# Patient Record
Sex: Male | Born: 1935 | Race: White | Hispanic: No | Smoking: Former smoker
Health system: Southern US, Community
[De-identification: ages and names within clinical notes are randomized; demographics above are authoritative.]

## PROBLEM LIST (undated history)

## (undated) DIAGNOSIS — I2510 Atherosclerotic heart disease of native coronary artery without angina pectoris: Secondary | ICD-10-CM

## (undated) DIAGNOSIS — E785 Hyperlipidemia, unspecified: Secondary | ICD-10-CM

## (undated) DIAGNOSIS — I714 Abdominal aortic aneurysm, without rupture, unspecified: Secondary | ICD-10-CM

## (undated) DIAGNOSIS — Z8679 Personal history of other diseases of the circulatory system: Secondary | ICD-10-CM

## (undated) DIAGNOSIS — I10 Essential (primary) hypertension: Secondary | ICD-10-CM

## (undated) DIAGNOSIS — I071 Rheumatic tricuspid insufficiency: Secondary | ICD-10-CM

## (undated) DIAGNOSIS — I723 Aneurysm of iliac artery: Secondary | ICD-10-CM

## (undated) DIAGNOSIS — N183 Chronic kidney disease, stage 3 unspecified: Secondary | ICD-10-CM

## (undated) DIAGNOSIS — W19XXXA Unspecified fall, initial encounter: Secondary | ICD-10-CM

## (undated) DIAGNOSIS — I5032 Chronic diastolic (congestive) heart failure: Secondary | ICD-10-CM

## (undated) DIAGNOSIS — T4145XA Adverse effect of unspecified anesthetic, initial encounter: Secondary | ICD-10-CM

## (undated) DIAGNOSIS — T8859XA Other complications of anesthesia, initial encounter: Secondary | ICD-10-CM

## (undated) DIAGNOSIS — I4821 Permanent atrial fibrillation: Secondary | ICD-10-CM

## (undated) DIAGNOSIS — C801 Malignant (primary) neoplasm, unspecified: Secondary | ICD-10-CM

## (undated) HISTORY — PX: TONSILLECTOMY: SUR1361

## (undated) HISTORY — PX: CARDIAC VALVE REPLACEMENT: SHX585

## (undated) HISTORY — DX: Malignant (primary) neoplasm, unspecified: C80.1

## (undated) HISTORY — PX: EMBOLIZATION: SHX1496

## (undated) HISTORY — PX: CARDIAC CATHETERIZATION: SHX172

## (undated) HISTORY — DX: Abdominal aortic aneurysm, without rupture: I71.4

## (undated) HISTORY — DX: Unspecified fall, initial encounter: W19.XXXA

## (undated) HISTORY — PX: VASECTOMY: SHX75

## (undated) HISTORY — PX: CATARACT EXTRACTION W/ INTRAOCULAR LENS  IMPLANT, BILATERAL: SHX1307

## (undated) HISTORY — PX: AORTIC VALVE REPLACEMENT: SHX41

## (undated) HISTORY — DX: Essential (primary) hypertension: I10

## (undated) HISTORY — DX: Abdominal aortic aneurysm, without rupture, unspecified: I71.40

---

## 1997-10-06 ENCOUNTER — Other Ambulatory Visit: Admission: RE | Admit: 1997-10-06 | Discharge: 1997-10-06 | Payer: Self-pay | Admitting: Cardiology

## 2000-01-23 ENCOUNTER — Emergency Department (HOSPITAL_COMMUNITY): Admission: EM | Admit: 2000-01-23 | Discharge: 2000-01-23 | Payer: Self-pay | Admitting: Emergency Medicine

## 2000-01-26 ENCOUNTER — Encounter (HOSPITAL_COMMUNITY): Admission: RE | Admit: 2000-01-26 | Discharge: 2000-04-25 | Payer: Self-pay

## 2004-10-16 ENCOUNTER — Ambulatory Visit: Payer: Self-pay | Admitting: Internal Medicine

## 2004-11-06 ENCOUNTER — Ambulatory Visit: Payer: Self-pay | Admitting: Internal Medicine

## 2004-12-07 ENCOUNTER — Ambulatory Visit: Payer: Self-pay | Admitting: Internal Medicine

## 2005-01-08 ENCOUNTER — Ambulatory Visit: Payer: Self-pay | Admitting: Internal Medicine

## 2005-01-10 ENCOUNTER — Ambulatory Visit: Payer: Self-pay | Admitting: Internal Medicine

## 2005-02-08 ENCOUNTER — Ambulatory Visit: Payer: Self-pay | Admitting: Internal Medicine

## 2005-03-09 ENCOUNTER — Ambulatory Visit: Payer: Self-pay | Admitting: Internal Medicine

## 2005-04-09 ENCOUNTER — Ambulatory Visit: Payer: Self-pay | Admitting: Internal Medicine

## 2005-10-17 ENCOUNTER — Ambulatory Visit: Payer: Self-pay | Admitting: Internal Medicine

## 2005-11-21 ENCOUNTER — Ambulatory Visit: Payer: Self-pay | Admitting: Internal Medicine

## 2005-11-22 ENCOUNTER — Ambulatory Visit: Payer: Self-pay | Admitting: Internal Medicine

## 2005-12-21 ENCOUNTER — Ambulatory Visit: Payer: Self-pay | Admitting: Internal Medicine

## 2006-01-22 ENCOUNTER — Ambulatory Visit: Payer: Self-pay | Admitting: Internal Medicine

## 2006-01-23 ENCOUNTER — Ambulatory Visit: Payer: Self-pay | Admitting: Internal Medicine

## 2006-02-21 ENCOUNTER — Ambulatory Visit: Payer: Self-pay | Admitting: Internal Medicine

## 2006-03-13 ENCOUNTER — Ambulatory Visit: Payer: Self-pay | Admitting: Internal Medicine

## 2006-03-13 LAB — CONVERTED CEMR LAB
PSA: 5.71 ng/mL — ABNORMAL HIGH (ref 0.10–4.00)
Sed Rate: 62 mm/hr — ABNORMAL HIGH (ref 0–20)
Total CK: 66 units/L (ref 7–195)

## 2006-03-21 ENCOUNTER — Ambulatory Visit: Payer: Self-pay | Admitting: Internal Medicine

## 2006-03-25 ENCOUNTER — Ambulatory Visit: Payer: Self-pay | Admitting: Internal Medicine

## 2006-04-04 ENCOUNTER — Ambulatory Visit: Payer: Self-pay | Admitting: Internal Medicine

## 2006-10-23 ENCOUNTER — Ambulatory Visit: Payer: Self-pay | Admitting: Internal Medicine

## 2006-11-27 ENCOUNTER — Ambulatory Visit: Payer: Self-pay | Admitting: Internal Medicine

## 2006-12-02 ENCOUNTER — Encounter: Payer: Self-pay | Admitting: Internal Medicine

## 2006-12-02 DIAGNOSIS — I482 Chronic atrial fibrillation, unspecified: Secondary | ICD-10-CM

## 2006-12-02 DIAGNOSIS — E785 Hyperlipidemia, unspecified: Secondary | ICD-10-CM | POA: Insufficient documentation

## 2006-12-02 DIAGNOSIS — N400 Enlarged prostate without lower urinary tract symptoms: Secondary | ICD-10-CM

## 2006-12-26 ENCOUNTER — Ambulatory Visit: Payer: Self-pay | Admitting: Internal Medicine

## 2006-12-26 LAB — CONVERTED CEMR LAB
INR: 2.8
INR: 2.8
Prothrombin Time: 20.2 s
Prothrombin Time: 20.2 s

## 2007-01-15 ENCOUNTER — Ambulatory Visit: Payer: Self-pay | Admitting: Internal Medicine

## 2007-01-21 ENCOUNTER — Telehealth (INDEPENDENT_AMBULATORY_CARE_PROVIDER_SITE_OTHER): Payer: Self-pay | Admitting: *Deleted

## 2007-01-27 ENCOUNTER — Telehealth: Payer: Self-pay | Admitting: Internal Medicine

## 2007-01-29 ENCOUNTER — Ambulatory Visit: Payer: Self-pay | Admitting: Internal Medicine

## 2007-01-29 LAB — CONVERTED CEMR LAB
INR: 2.7
Prothrombin Time: 20.1 s

## 2007-02-10 ENCOUNTER — Telehealth: Payer: Self-pay | Admitting: Internal Medicine

## 2007-02-25 ENCOUNTER — Telehealth (INDEPENDENT_AMBULATORY_CARE_PROVIDER_SITE_OTHER): Payer: Self-pay | Admitting: *Deleted

## 2007-02-28 ENCOUNTER — Ambulatory Visit: Payer: Self-pay | Admitting: Internal Medicine

## 2007-02-28 LAB — CONVERTED CEMR LAB
INR: 2.2
Prothrombin Time: 18.2 s

## 2007-03-10 ENCOUNTER — Ambulatory Visit: Payer: Self-pay | Admitting: Internal Medicine

## 2007-03-10 DIAGNOSIS — M109 Gout, unspecified: Secondary | ICD-10-CM

## 2007-03-10 LAB — CONVERTED CEMR LAB
ALT: 19 units/L (ref 0–53)
AST: 22 units/L (ref 0–37)
Albumin: 3.7 g/dL (ref 3.5–5.2)
Alkaline Phosphatase: 60 units/L (ref 39–117)
BUN: 22 mg/dL (ref 6–23)
Basophils Absolute: 0 10*3/uL (ref 0.0–0.1)
Basophils Relative: 0.2 % (ref 0.0–1.0)
Bilirubin, Direct: 0.1 mg/dL (ref 0.0–0.3)
CO2: 33 meq/L — ABNORMAL HIGH (ref 19–32)
Calcium: 9.2 mg/dL (ref 8.4–10.5)
Chloride: 102 meq/L (ref 96–112)
Cholesterol: 201 mg/dL (ref 0–200)
Creatinine, Ser: 1.4 mg/dL (ref 0.4–1.5)
Direct LDL: 130.9 mg/dL
Eosinophils Absolute: 0.3 10*3/uL (ref 0.0–0.6)
Eosinophils Relative: 4.7 % (ref 0.0–5.0)
GFR calc Af Amer: 62 mL/min
GFR calc non Af Amer: 52 mL/min
Glucose, Bld: 77 mg/dL (ref 70–99)
HCT: 46.8 % (ref 39.0–52.0)
HDL: 55.2 mg/dL (ref >39.0)
Hemoglobin: 16 g/dL (ref 13.0–17.0)
Lymphocytes Relative: 17.7 % (ref 12.0–46.0)
MCHC: 34.3 g/dL (ref 30.0–36.0)
MCV: 92.7 fL (ref 78.0–100.0)
Monocytes Absolute: 0.7 10*3/uL (ref 0.2–0.7)
Monocytes Relative: 12.9 % — ABNORMAL HIGH (ref 3.0–11.0)
Neutro Abs: 3.4 10*3/uL (ref 1.4–7.7)
Neutrophils Relative %: 64.5 % (ref 43.0–77.0)
Platelets: 164 10*3/uL (ref 150–400)
Potassium: 3.9 meq/L (ref 3.5–5.1)
RBC: 5.04 M/uL (ref 4.22–5.81)
RDW: 13 % (ref 11.5–14.6)
Sodium: 142 meq/L (ref 135–145)
TSH: 2.86 microintl units/mL (ref 0.35–5.50)
Total Bilirubin: 0.9 mg/dL (ref 0.3–1.2)
Total CHOL/HDL Ratio: 3.6
Total Protein: 6.9 g/dL (ref 6.0–8.3)
Triglycerides: 71 mg/dL (ref 0–149)
VLDL: 14 mg/dL (ref 0–40)
WBC: 5.4 10*3/uL (ref 4.5–10.5)

## 2007-03-13 ENCOUNTER — Telehealth: Payer: Self-pay | Admitting: Internal Medicine

## 2007-03-17 ENCOUNTER — Telehealth: Payer: Self-pay | Admitting: Internal Medicine

## 2007-03-17 ENCOUNTER — Encounter: Admission: RE | Admit: 2007-03-17 | Discharge: 2007-03-17 | Payer: Self-pay | Admitting: Otolaryngology

## 2007-03-27 ENCOUNTER — Encounter: Admission: RE | Admit: 2007-03-27 | Discharge: 2007-06-04 | Payer: Self-pay | Admitting: Otolaryngology

## 2007-03-28 ENCOUNTER — Telehealth: Payer: Self-pay | Admitting: Internal Medicine

## 2007-04-08 ENCOUNTER — Ambulatory Visit: Payer: Self-pay | Admitting: Internal Medicine

## 2007-04-08 LAB — CONVERTED CEMR LAB
INR: 2.6
Prothrombin Time: 19.5 s

## 2007-04-09 ENCOUNTER — Encounter: Payer: Self-pay | Admitting: Internal Medicine

## 2007-04-11 ENCOUNTER — Ambulatory Visit: Payer: Self-pay | Admitting: Internal Medicine

## 2007-05-12 ENCOUNTER — Telehealth (INDEPENDENT_AMBULATORY_CARE_PROVIDER_SITE_OTHER): Payer: Self-pay | Admitting: *Deleted

## 2007-05-12 ENCOUNTER — Telehealth: Payer: Self-pay | Admitting: Internal Medicine

## 2007-05-13 ENCOUNTER — Encounter: Payer: Self-pay | Admitting: Internal Medicine

## 2007-05-21 ENCOUNTER — Telehealth (INDEPENDENT_AMBULATORY_CARE_PROVIDER_SITE_OTHER): Payer: Self-pay | Admitting: *Deleted

## 2007-06-16 ENCOUNTER — Encounter: Payer: Self-pay | Admitting: Internal Medicine

## 2007-07-21 ENCOUNTER — Encounter: Payer: Self-pay | Admitting: Internal Medicine

## 2007-08-27 ENCOUNTER — Encounter: Payer: Self-pay | Admitting: Internal Medicine

## 2007-10-15 ENCOUNTER — Ambulatory Visit: Payer: Self-pay | Admitting: Internal Medicine

## 2007-10-15 LAB — CONVERTED CEMR LAB
INR: 1.9
Prothrombin Time: 16.8 s

## 2007-10-20 ENCOUNTER — Ambulatory Visit: Payer: Self-pay | Admitting: Internal Medicine

## 2007-11-12 ENCOUNTER — Encounter: Payer: Self-pay | Admitting: Internal Medicine

## 2007-11-12 ENCOUNTER — Ambulatory Visit: Payer: Self-pay | Admitting: Internal Medicine

## 2007-11-12 LAB — CONVERTED CEMR LAB
INR: 1.8
Prothrombin Time: 16.4 s

## 2007-12-09 ENCOUNTER — Ambulatory Visit: Payer: Self-pay | Admitting: Internal Medicine

## 2007-12-09 LAB — CONVERTED CEMR LAB
INR: 2.2
Prothrombin Time: 18 s

## 2008-01-06 ENCOUNTER — Ambulatory Visit: Payer: Self-pay | Admitting: Internal Medicine

## 2008-01-06 LAB — CONVERTED CEMR LAB
INR: 2
Prothrombin Time: 17.4 s

## 2008-01-13 ENCOUNTER — Telehealth: Payer: Self-pay | Admitting: Internal Medicine

## 2008-02-04 ENCOUNTER — Ambulatory Visit: Payer: Self-pay | Admitting: Internal Medicine

## 2008-02-04 LAB — CONVERTED CEMR LAB
INR: 1.6
Prothrombin Time: 15.5 s

## 2008-03-05 ENCOUNTER — Ambulatory Visit: Payer: Self-pay | Admitting: Internal Medicine

## 2008-03-05 LAB — CONVERTED CEMR LAB
INR: 2.7
Prothrombin Time: 19.9 s

## 2008-03-17 ENCOUNTER — Ambulatory Visit: Payer: Self-pay | Admitting: Internal Medicine

## 2008-03-18 LAB — CONVERTED CEMR LAB
ALT: 16 units/L (ref 0–53)
AST: 24 units/L (ref 0–37)
Albumin: 3.6 g/dL (ref 3.5–5.2)
Alkaline Phosphatase: 62 units/L (ref 39–117)
BUN: 22 mg/dL (ref 6–23)
Basophils Absolute: 0 10*3/uL (ref 0.0–0.1)
Basophils Relative: 0 % (ref 0.0–3.0)
Bilirubin, Direct: 0.2 mg/dL (ref 0.0–0.3)
CO2: 34 meq/L — ABNORMAL HIGH (ref 19–32)
Calcium: 9.1 mg/dL (ref 8.4–10.5)
Chloride: 99 meq/L (ref 96–112)
Cholesterol: 185 mg/dL (ref 0–200)
Creatinine, Ser: 1.4 mg/dL (ref 0.4–1.5)
Eosinophils Absolute: 0.3 10*3/uL (ref 0.0–0.7)
Eosinophils Relative: 5.6 % — ABNORMAL HIGH (ref 0.0–5.0)
GFR calc Af Amer: 62 mL/min
GFR calc non Af Amer: 51 mL/min
Glucose, Bld: 80 mg/dL (ref 70–99)
HCT: 47.5 % (ref 39.0–52.0)
HDL: 46.8 mg/dL (ref >39.0)
Hemoglobin: 16.1 g/dL (ref 13.0–17.0)
LDL Cholesterol: 124 mg/dL — ABNORMAL HIGH (ref 0–99)
Lymphocytes Relative: 19.2 % (ref 12.0–46.0)
MCHC: 33.9 g/dL (ref 30.0–36.0)
MCV: 93.9 fL (ref 78.0–100.0)
Monocytes Absolute: 0.6 10*3/uL (ref 0.1–1.0)
Monocytes Relative: 12.5 % — ABNORMAL HIGH (ref 3.0–12.0)
Neutro Abs: 2.9 10*3/uL (ref 1.4–7.7)
Neutrophils Relative %: 62.7 % (ref 43.0–77.0)
PSA: 15.67 ng/mL — ABNORMAL HIGH (ref 0.10–4.00)
Platelets: 148 10*3/uL — ABNORMAL LOW (ref 150–400)
Potassium: 4.2 meq/L (ref 3.5–5.1)
RBC: 5.06 M/uL (ref 4.22–5.81)
RDW: 13.4 % (ref 11.5–14.6)
Sodium: 139 meq/L (ref 135–145)
TSH: 3.2 microintl units/mL (ref 0.35–5.50)
Total Bilirubin: 0.9 mg/dL (ref 0.3–1.2)
Total CHOL/HDL Ratio: 4
Total Protein: 7.4 g/dL (ref 6.0–8.3)
Triglycerides: 69 mg/dL (ref 0–149)
VLDL: 14 mg/dL (ref 0–40)
WBC: 4.7 10*3/uL (ref 4.5–10.5)

## 2008-03-26 ENCOUNTER — Encounter: Payer: Self-pay | Admitting: Internal Medicine

## 2008-04-01 ENCOUNTER — Telehealth: Payer: Self-pay | Admitting: Internal Medicine

## 2008-04-05 ENCOUNTER — Ambulatory Visit: Payer: Self-pay | Admitting: Internal Medicine

## 2008-04-05 LAB — CONVERTED CEMR LAB
INR: 2.2
Prothrombin Time: 18 s

## 2008-04-26 ENCOUNTER — Ambulatory Visit (HOSPITAL_BASED_OUTPATIENT_CLINIC_OR_DEPARTMENT_OTHER): Admission: RE | Admit: 2008-04-26 | Discharge: 2008-04-26 | Payer: Self-pay | Admitting: Urology

## 2008-04-26 ENCOUNTER — Encounter: Payer: Self-pay | Admitting: Internal Medicine

## 2008-04-26 ENCOUNTER — Encounter (INDEPENDENT_AMBULATORY_CARE_PROVIDER_SITE_OTHER): Payer: Self-pay | Admitting: Urology

## 2008-05-07 ENCOUNTER — Encounter: Payer: Self-pay | Admitting: Internal Medicine

## 2008-06-07 ENCOUNTER — Ambulatory Visit: Payer: Self-pay | Admitting: Internal Medicine

## 2008-06-07 LAB — CONVERTED CEMR LAB
INR: 3.3
Prothrombin Time: 21.8 s

## 2008-06-21 ENCOUNTER — Ambulatory Visit: Payer: Self-pay | Admitting: Internal Medicine

## 2008-06-24 ENCOUNTER — Ambulatory Visit: Payer: Self-pay | Admitting: Internal Medicine

## 2008-06-24 DIAGNOSIS — J069 Acute upper respiratory infection, unspecified: Secondary | ICD-10-CM | POA: Insufficient documentation

## 2008-07-02 ENCOUNTER — Ambulatory Visit: Payer: Self-pay | Admitting: Internal Medicine

## 2008-07-12 ENCOUNTER — Telehealth: Payer: Self-pay | Admitting: Internal Medicine

## 2008-07-12 ENCOUNTER — Ambulatory Visit: Payer: Self-pay | Admitting: Internal Medicine

## 2008-07-22 ENCOUNTER — Ambulatory Visit: Payer: Self-pay | Admitting: Internal Medicine

## 2008-07-22 LAB — CONVERTED CEMR LAB
INR: 3.4
Prothrombin Time: 22.4 s

## 2008-07-26 ENCOUNTER — Telehealth: Payer: Self-pay | Admitting: Internal Medicine

## 2008-09-06 ENCOUNTER — Ambulatory Visit: Payer: Self-pay | Admitting: Internal Medicine

## 2008-09-06 LAB — CONVERTED CEMR LAB
INR: 2.7
Prothrombin Time: 19.9 s

## 2008-10-04 ENCOUNTER — Encounter: Payer: Self-pay | Admitting: Internal Medicine

## 2008-10-04 ENCOUNTER — Telehealth: Payer: Self-pay | Admitting: Internal Medicine

## 2008-10-26 ENCOUNTER — Ambulatory Visit: Payer: Self-pay | Admitting: Internal Medicine

## 2008-10-26 DIAGNOSIS — N529 Male erectile dysfunction, unspecified: Secondary | ICD-10-CM

## 2008-10-28 ENCOUNTER — Ambulatory Visit: Payer: Self-pay | Admitting: Internal Medicine

## 2008-10-29 ENCOUNTER — Encounter: Payer: Self-pay | Admitting: Internal Medicine

## 2008-11-05 ENCOUNTER — Ambulatory Visit: Payer: Self-pay | Admitting: Internal Medicine

## 2008-11-05 LAB — CONVERTED CEMR LAB
INR: 2.3
Prothrombin Time: 10.7 s

## 2008-11-16 ENCOUNTER — Encounter: Admission: RE | Admit: 2008-11-16 | Discharge: 2008-11-16 | Payer: Self-pay | Admitting: Orthopedic Surgery

## 2008-11-16 ENCOUNTER — Telehealth: Payer: Self-pay | Admitting: Internal Medicine

## 2008-11-17 ENCOUNTER — Telehealth: Payer: Self-pay | Admitting: Internal Medicine

## 2008-11-18 ENCOUNTER — Encounter: Payer: Self-pay | Admitting: Internal Medicine

## 2008-11-25 ENCOUNTER — Encounter: Payer: Self-pay | Admitting: Internal Medicine

## 2008-11-30 ENCOUNTER — Telehealth: Payer: Self-pay | Admitting: Internal Medicine

## 2008-12-01 ENCOUNTER — Telehealth (INDEPENDENT_AMBULATORY_CARE_PROVIDER_SITE_OTHER): Payer: Self-pay

## 2008-12-03 ENCOUNTER — Encounter: Payer: Self-pay | Admitting: Internal Medicine

## 2009-01-03 ENCOUNTER — Encounter: Payer: Self-pay | Admitting: Internal Medicine

## 2009-01-04 ENCOUNTER — Encounter: Payer: Self-pay | Admitting: Internal Medicine

## 2009-01-17 ENCOUNTER — Telehealth (INDEPENDENT_AMBULATORY_CARE_PROVIDER_SITE_OTHER): Payer: Self-pay | Admitting: *Deleted

## 2009-01-27 ENCOUNTER — Telehealth: Payer: Self-pay | Admitting: Family Medicine

## 2009-02-08 ENCOUNTER — Ambulatory Visit: Payer: Self-pay | Admitting: Internal Medicine

## 2009-02-21 ENCOUNTER — Ambulatory Visit: Payer: Self-pay | Admitting: Internal Medicine

## 2009-03-01 ENCOUNTER — Encounter: Payer: Self-pay | Admitting: Internal Medicine

## 2009-03-10 ENCOUNTER — Ambulatory Visit: Payer: Self-pay | Admitting: Internal Medicine

## 2009-04-01 ENCOUNTER — Ambulatory Visit: Payer: Self-pay | Admitting: Internal Medicine

## 2009-04-01 DIAGNOSIS — M353 Polymyalgia rheumatica: Secondary | ICD-10-CM | POA: Insufficient documentation

## 2009-04-04 LAB — CONVERTED CEMR LAB
AST: 25 units/L (ref 0–37)
Albumin: 4.1 g/dL (ref 3.5–5.2)
Alkaline Phosphatase: 78 units/L (ref 39–117)
Basophils Absolute: 0 10*3/uL (ref 0.0–0.1)
Basophils Relative: 0 % (ref 0.0–3.0)
Bilirubin, Direct: 0.1 mg/dL (ref 0.0–0.3)
Calcium: 9.1 mg/dL (ref 8.4–10.5)
Creatinine, Ser: 1.4 mg/dL (ref 0.4–1.5)
Eosinophils Absolute: 0.3 10*3/uL (ref 0.0–0.7)
GFR calc non Af Amer: 51.27 mL/min (ref >60)
HDL: 53.7 mg/dL (ref >39.00)
Hemoglobin: 14.9 g/dL (ref 13.0–17.0)
LDL Cholesterol: 127 mg/dL — ABNORMAL HIGH (ref 0–99)
Lymphocytes Relative: 13.6 % (ref 12.0–46.0)
MCHC: 34.5 g/dL (ref 30.0–36.0)
Monocytes Relative: 12.1 % — ABNORMAL HIGH (ref 3.0–12.0)
Neutro Abs: 4.6 10*3/uL (ref 1.4–7.7)
Neutrophils Relative %: 69.2 % (ref 43.0–77.0)
RBC: 4.78 M/uL (ref 4.22–5.81)
Sodium: 141 meq/L (ref 135–145)
Total CHOL/HDL Ratio: 4
Total Protein: 8.3 g/dL (ref 6.0–8.3)
Triglycerides: 55 mg/dL (ref 0.0–149.0)
VLDL: 11 mg/dL (ref 0.0–40.0)

## 2009-04-11 ENCOUNTER — Ambulatory Visit: Payer: Self-pay | Admitting: Internal Medicine

## 2009-04-11 LAB — CONVERTED CEMR LAB
INR: 2.7
Prothrombin Time: 19.9 s

## 2009-05-20 ENCOUNTER — Encounter (INDEPENDENT_AMBULATORY_CARE_PROVIDER_SITE_OTHER): Payer: Self-pay | Admitting: *Deleted

## 2009-05-20 ENCOUNTER — Telehealth: Payer: Self-pay | Admitting: Internal Medicine

## 2009-05-21 ENCOUNTER — Encounter: Payer: Self-pay | Admitting: Internal Medicine

## 2009-06-04 DIAGNOSIS — W19XXXA Unspecified fall, initial encounter: Secondary | ICD-10-CM

## 2009-06-04 HISTORY — DX: Unspecified fall, initial encounter: W19.XXXA

## 2009-06-22 ENCOUNTER — Encounter: Payer: Self-pay | Admitting: Internal Medicine

## 2009-07-26 ENCOUNTER — Telehealth: Payer: Self-pay | Admitting: Internal Medicine

## 2009-07-27 ENCOUNTER — Encounter: Payer: Self-pay | Admitting: Internal Medicine

## 2009-07-28 ENCOUNTER — Encounter: Payer: Self-pay | Admitting: Internal Medicine

## 2009-09-01 ENCOUNTER — Encounter: Payer: Self-pay | Admitting: Internal Medicine

## 2009-09-28 ENCOUNTER — Encounter: Payer: Self-pay | Admitting: Internal Medicine

## 2009-10-03 ENCOUNTER — Ambulatory Visit: Payer: Self-pay | Admitting: Internal Medicine

## 2009-10-03 DIAGNOSIS — M549 Dorsalgia, unspecified: Secondary | ICD-10-CM | POA: Insufficient documentation

## 2009-10-03 LAB — CONVERTED CEMR LAB: INR: 2.7

## 2009-10-11 ENCOUNTER — Encounter: Payer: Self-pay | Admitting: Internal Medicine

## 2009-10-12 ENCOUNTER — Telehealth: Payer: Self-pay | Admitting: Internal Medicine

## 2009-10-13 ENCOUNTER — Encounter: Payer: Self-pay | Admitting: Internal Medicine

## 2009-10-18 ENCOUNTER — Telehealth: Payer: Self-pay | Admitting: Internal Medicine

## 2009-10-25 ENCOUNTER — Telehealth: Payer: Self-pay | Admitting: Internal Medicine

## 2009-11-01 ENCOUNTER — Ambulatory Visit: Payer: Self-pay | Admitting: Internal Medicine

## 2009-11-01 DIAGNOSIS — I359 Nonrheumatic aortic valve disorder, unspecified: Secondary | ICD-10-CM | POA: Insufficient documentation

## 2009-11-01 LAB — CONVERTED CEMR LAB
ALT: 21 units/L (ref 0–53)
AST: 22 units/L (ref 0–37)
Albumin: 3.4 g/dL — ABNORMAL LOW (ref 3.5–5.2)
Alkaline Phosphatase: 63 units/L (ref 39–117)
Basophils Absolute: 0 10*3/uL (ref 0.0–0.1)
Basophils Relative: 0.4 % (ref 0.0–3.0)
Calcium: 8.7 mg/dL (ref 8.4–10.5)
Eosinophils Relative: 5.2 % — ABNORMAL HIGH (ref 0.0–5.0)
GFR calc non Af Amer: 58.35 mL/min (ref >60)
HCT: 38.1 % — ABNORMAL LOW (ref 39.0–52.0)
Hemoglobin: 13 g/dL (ref 13.0–17.0)
Lymphocytes Relative: 11.8 % — ABNORMAL LOW (ref 12.0–46.0)
Lymphs Abs: 0.7 10*3/uL (ref 0.7–4.0)
Monocytes Relative: 16.1 % — ABNORMAL HIGH (ref 3.0–12.0)
Neutro Abs: 3.7 10*3/uL (ref 1.4–7.7)
Potassium: 4.8 meq/L (ref 3.5–5.1)
RBC: 4.09 M/uL — ABNORMAL LOW (ref 4.22–5.81)
Sodium: 135 meq/L (ref 135–145)
Total Protein: 6.5 g/dL (ref 6.0–8.3)
WBC: 5.5 10*3/uL (ref 4.5–10.5)

## 2009-11-04 ENCOUNTER — Ambulatory Visit: Payer: Self-pay | Admitting: Internal Medicine

## 2009-11-04 LAB — CONVERTED CEMR LAB: INR: 2.4

## 2009-11-15 ENCOUNTER — Ambulatory Visit: Payer: Self-pay | Admitting: Internal Medicine

## 2009-12-07 ENCOUNTER — Ambulatory Visit: Payer: Self-pay | Admitting: Internal Medicine

## 2010-01-02 ENCOUNTER — Telehealth: Payer: Self-pay | Admitting: Internal Medicine

## 2010-01-11 ENCOUNTER — Ambulatory Visit: Payer: Self-pay | Admitting: Internal Medicine

## 2010-01-12 ENCOUNTER — Telehealth: Payer: Self-pay

## 2010-01-24 ENCOUNTER — Telehealth: Payer: Self-pay | Admitting: Internal Medicine

## 2010-02-13 ENCOUNTER — Ambulatory Visit: Payer: Self-pay | Admitting: Internal Medicine

## 2010-02-15 ENCOUNTER — Ambulatory Visit: Payer: Self-pay | Admitting: Internal Medicine

## 2010-03-06 ENCOUNTER — Ambulatory Visit: Payer: Self-pay | Admitting: Cardiology

## 2010-03-13 ENCOUNTER — Ambulatory Visit: Payer: Self-pay | Admitting: Internal Medicine

## 2010-03-13 LAB — CONVERTED CEMR LAB: INR: 3

## 2010-03-14 ENCOUNTER — Encounter: Payer: Self-pay | Admitting: Cardiology

## 2010-03-14 ENCOUNTER — Encounter: Payer: Self-pay | Admitting: Internal Medicine

## 2010-03-14 ENCOUNTER — Ambulatory Visit (HOSPITAL_COMMUNITY): Admission: RE | Admit: 2010-03-14 | Discharge: 2010-03-14 | Payer: Self-pay

## 2010-03-14 ENCOUNTER — Ambulatory Visit: Payer: Self-pay | Admitting: Internal Medicine

## 2010-03-14 ENCOUNTER — Ambulatory Visit: Payer: Self-pay

## 2010-03-20 ENCOUNTER — Ambulatory Visit: Payer: Self-pay | Admitting: Cardiology

## 2010-03-20 ENCOUNTER — Encounter: Admission: RE | Admit: 2010-03-20 | Discharge: 2010-03-20 | Payer: Self-pay | Admitting: Cardiology

## 2010-03-28 ENCOUNTER — Ambulatory Visit (HOSPITAL_COMMUNITY): Admission: RE | Admit: 2010-03-28 | Discharge: 2010-03-28 | Payer: Self-pay | Admitting: Cardiology

## 2010-03-28 ENCOUNTER — Ambulatory Visit: Payer: Self-pay | Admitting: Cardiology

## 2010-03-31 ENCOUNTER — Ambulatory Visit: Payer: Self-pay | Admitting: Cardiology

## 2010-04-04 ENCOUNTER — Ambulatory Visit: Payer: Self-pay | Admitting: Surgery

## 2010-04-04 ENCOUNTER — Encounter: Payer: Self-pay | Admitting: Internal Medicine

## 2010-04-10 ENCOUNTER — Encounter: Payer: Self-pay | Admitting: Surgery

## 2010-04-10 ENCOUNTER — Telehealth: Payer: Self-pay | Admitting: Internal Medicine

## 2010-04-12 ENCOUNTER — Encounter: Payer: Self-pay | Admitting: Surgery

## 2010-04-12 ENCOUNTER — Inpatient Hospital Stay (HOSPITAL_COMMUNITY): Admission: RE | Admit: 2010-04-12 | Discharge: 2010-05-01 | Payer: Self-pay | Admitting: Surgery

## 2010-04-12 ENCOUNTER — Ambulatory Visit: Payer: Self-pay | Admitting: Cardiology

## 2010-04-12 ENCOUNTER — Ambulatory Visit: Payer: Self-pay | Admitting: Surgery

## 2010-04-19 ENCOUNTER — Telehealth: Payer: Self-pay | Admitting: Internal Medicine

## 2010-04-24 ENCOUNTER — Encounter: Payer: Self-pay | Admitting: Surgery

## 2010-05-01 ENCOUNTER — Ambulatory Visit: Payer: Self-pay | Admitting: Internal Medicine

## 2010-05-15 ENCOUNTER — Ambulatory Visit: Payer: Self-pay | Admitting: Cardiology

## 2010-05-30 ENCOUNTER — Ambulatory Visit: Payer: Self-pay | Admitting: Surgery

## 2010-05-30 ENCOUNTER — Encounter
Admission: RE | Admit: 2010-05-30 | Discharge: 2010-05-30 | Payer: Self-pay | Source: Home / Self Care | Attending: Surgery | Admitting: Surgery

## 2010-06-01 ENCOUNTER — Ambulatory Visit: Payer: Self-pay | Admitting: Cardiology

## 2010-06-15 ENCOUNTER — Ambulatory Visit: Payer: Self-pay | Admitting: Cardiology

## 2010-06-15 ENCOUNTER — Encounter (HOSPITAL_COMMUNITY)
Admission: RE | Admit: 2010-06-15 | Discharge: 2010-07-04 | Payer: Self-pay | Source: Home / Self Care | Attending: Cardiology | Admitting: Cardiology

## 2010-06-20 ENCOUNTER — Ambulatory Visit: Admission: RE | Admit: 2010-06-20 | Discharge: 2010-06-20 | Payer: Self-pay | Source: Home / Self Care

## 2010-06-20 ENCOUNTER — Encounter: Payer: Self-pay | Admitting: Cardiology

## 2010-06-20 ENCOUNTER — Ambulatory Visit (HOSPITAL_COMMUNITY)
Admission: RE | Admit: 2010-06-20 | Discharge: 2010-06-20 | Payer: Self-pay | Source: Home / Self Care | Attending: Cardiology | Admitting: Cardiology

## 2010-06-22 ENCOUNTER — Encounter: Payer: Self-pay | Admitting: Internal Medicine

## 2010-07-04 NOTE — Assessment & Plan Note (Signed)
Summary: pt/njr/pt rescd//ccm  Nurse Visit   Allergies: 1)  ! * Diltiazem Laboratory Results   Blood Tests   Date/Time Received: December 07, 2009 11:37 AM  Date/Time Reported: December 07, 2009 11:37 AM    INR: 2.4   (Normal Range: 0.88-1.12   Therap INR: 2.0-3.5) Comments: Wynona Canes, CMA  December 07, 2009 11:37 AM     Orders Added: 1)  Est. Patient Level I [99211] 2)  Protime [78469GE]  Laboratory Results   Blood Tests      INR: 2.4   (Normal Range: 0.88-1.12   Therap INR: 2.0-3.5) Comments: Wynona Canes, CMA  December 07, 2009 11:37 AM       ANTICOAGULATION RECORD PREVIOUS REGIMEN & LAB RESULTS Anticoagulation Diagnosis:  v58.83,v58.61,427.31 on  12/26/2006 Previous INR Goal Range:  2.0-3.0 on  12/26/2006 Previous INR:  2.4 on  11/04/2009 Previous Coumadin Dose(mg):  5mg  QD on  12/09/2007 Previous Regimen:  same on  11/04/2009 Previous Coagulation Comments:  Dr. Clent Ridges approved on  02/04/2008  NEW REGIMEN & LAB RESULTS Current INR: 2.4 Regimen: same  (no change)       Repeat testing in: 4 weeks MEDICATIONS WELCHOL 625 MG TABS (COLESEVELAM HCL) 2 two times a day LANOXIN 0.125 MG TABS (DIGOXIN) 1/2 once daily CLARITIN 10 MG CAPS (LORATADINE) 1 once daily as needed COUMADIN 5 MG TABS (WARFARIN SODIUM) UAD TRAMADOL HCL 50 MG TABS (TRAMADOL HCL) one every 6 hours as needed for pain   Anticoagulation Visit Questionnaire      Coumadin dose missed/changed:  No      Abnormal Bleeding Symptoms:  No   Any diet changes including alcohol intake, vegetables or greens since the last visit:  No Any illnesses or hospitalizations since the last visit:  No Any signs of clotting since the last visit (including chest discomfort, dizziness, shortness of breath, arm tingling, slurred speech, swelling or redness in leg):  No

## 2010-07-04 NOTE — Progress Notes (Signed)
Summary: cough and losartin  Phone Note Call from Patient Call back at Home Phone [phone]   Caller: Patient Call For: Evan Savers  MD Summary of Call: pt on losartan he has a cough ?side effect. [facility] [employer] [phone] Initial call taken by: Heron Sabins,  Oct 25, 2009 10:25 AM  Follow-up for Phone Call        called pt - still with cough - not using cough med r/t constipation , wondering if cough r/t losartin - since it seemed to develope at the time of the switch. Pharm indicated that this could be a side effect.  Has enough losartin until thursday but would like to know if we could call out something else? Dr. Amador Cunas to advise.  Follow-up by: Duard Brady LPN,  Oct 25, 2009 11:55 AM  Additional Follow-up for Phone Call Additional follow up Details #1::        diltiazem ER 240  one dauly  #90 Additional Follow-up by: Evan Savers  MD,  Oct 25, 2009 12:31 PM    Additional Follow-up for Phone Call Additional follow up Details #2::    med list updated , new med efilled , pt aware. KIK Follow-up by: Duard Brady LPN,  Oct 25, 2009 1:14 PM  New/Updated Medications: DILTIAZEM HCL ER BEADS 240 MG XR24H-CAP (DILTIAZEM HCL ER BEADS) qd Prescriptions: DILTIAZEM HCL ER BEADS 240 MG XR24H-CAP (DILTIAZEM HCL ER BEADS) qd  #90 x 2   Entered by:   Duard Brady LPN   Authorized by:   Evan Savers  MD   Signed by:   Duard Brady LPN on 46/96/2952   Method used:   Electronically to        [facility] [phone]* (retail)       [address]       [place], [place]  [address]       Ph: [phone]       Fax: [phone]   RxID:   [id number]

## 2010-07-04 NOTE — Progress Notes (Signed)
Summary: medication question  Phone Note Call from Patient   Caller: Patient Call For: Evan Savers  MD Summary of Call: Pt needs a subtitute for [place] called to [facility] [employer] [facility] [place])   [phone] Initial call taken by: Lynann Beaver CMA,  July 26, 2009 4:41 PM  Follow-up for Phone Call        generic Hyzaar 100/25  #90 one daily Follow-up by: Evan Savers  MD,  July 26, 2009 5:13 PM    New/Updated Medications: HYZAAR 100-25 MG TABS (LOSARTAN POTASSIUM-HCTZ) one by mouth daily Prescriptions: HYZAAR 100-25 MG TABS (LOSARTAN POTASSIUM-HCTZ) one by mouth daily  #100 x 1   Entered by:   Lynann Beaver CMA   Authorized by:   Evan Savers  MD   Signed by:   Lynann Beaver CMA on 07/26/2009   Method used:   Electronically to        [facility]. [phone]* (retail)       [address]       [place], [place]  [address]       Ph: [phone]       Fax: [phone]   RxID:   [id number]

## 2010-07-04 NOTE — Assessment & Plan Note (Signed)
Summary: PT/    pt rsc/njr  Nurse Visit   Allergies: 1)  ! * Diltiazem Laboratory Results   Blood Tests      INR: 2.1   (Normal Range: 0.88-1.12   Therap INR: 2.0-3.5) Comments: Rita Ohara  January 11, 2010 4:24 PM     Orders Added: 1)  Est. Patient Level I [99211] 2)  Protime [[id number]]   ANTICOAGULATION RECORD PREVIOUS REGIMEN & LAB RESULTS Anticoagulation Diagnosis:  v58.83,v58.61,427.31 on  12/26/2006 Previous INR Goal Range:  2.0-3.0 on  12/26/2006 Previous INR:  2.4 on  12/07/2009 Previous Coumadin Dose(mg):  5mg  QD on  12/09/2007 Previous Regimen:  same on  11/04/2009 Previous Coagulation Comments:  Dr. Clent Ridges approved on  02/04/2008  NEW REGIMEN & LAB RESULTS Current INR: 2.1 Regimen: same  Repeat testing in: 4 weeks  Anticoagulation Visit Questionnaire Coumadin dose missed/changed:  Yes Coumadin Dose Comments:  one or more missed dose(s) Abnormal Bleeding Symptoms:  No  Any diet changes including alcohol intake, vegetables or greens since the last visit:  No Any illnesses or hospitalizations since the last visit:  No Any signs of clotting since the last visit (including chest discomfort, dizziness, shortness of breath, arm tingling, slurred speech, swelling or redness in leg):  No  MEDICATIONS WELCHOL 625 MG TABS (COLESEVELAM HCL) 2 two times a day LANOXIN 0.125 MG TABS (DIGOXIN) 1/2 once daily CLARITIN 10 MG CAPS (LORATADINE) 1 once daily as needed COUMADIN 5 MG TABS (WARFARIN SODIUM) UAD TRAMADOL HCL 50 MG TABS (TRAMADOL HCL) one every 6 hours as needed for pain

## 2010-07-04 NOTE — Progress Notes (Signed)
Summary: refill coumadin  Phone Note Refill Request Message from:  Fax from Pharmacy on January 02, 2010 9:55 AM  Refills Requested: Medication #1:  COUMADIN 5 MG TABS UAD   Last Ref[facility]: [facility]/01/2009 [facility]       Method Requested: Fax to Local Pharmacy Initial call taken by: Duard Brady LPN,  January 02, 2010 9:55 AM    Prescriptions: COUMADIN 5 MG TABS (WARFARIN SODIUM) UAD  #30 x 6   Entered by:   Duard Brady LPN   Authorized by:   Gordy Savers  MD   Signed by:   Duard Brady LPN on 04/54/0981   Method used:   Faxed to ...       [facility] [address] [phone]* (retail)       [address]       [place], [place]  [address]       Ph: [phone] or [phone]       Fax: [phone]   RxID:   [id number]

## 2010-07-04 NOTE — Assessment & Plan Note (Signed)
Summary: emp---will fast//ccm/then to PT/pt rsc/cjr   Vital Signs:  Patient profile:   77 year old male Height:      69.5 inches Weight:      169 pounds BMI:     24.69 Temp:     97.8 degrees F oral Pulse rate:   66 / minute Pulse rhythm:   irregular Resp:     14 per minute BP sitting:   160 / 90  (left arm) Cuff size:   regular  Vitals Entered By: Duard Brady LPN (February 13, 2010 9:28 AM) CC: cpx - ppwk to be filled out for retirerment community Is Patient Diabetic? No   CC:  cpx - ppwk to be filled out for retirerment community.  History of Present Illness: 77 year old patient who is seen today for a wellness exam.  he will be relocating to [place]  and requires an immunization update physical hand formed completions. He has a history of severe aortic stenosis and has noted some dyspnea on exertion.  He is scheduled for cardiology follow-up next month.  He has dyslipidemia atrial fibrillation, hypertension.  He has been seen by urology for BPH.  He remains on chronic Coumadin.  He has had some intolerance to statins in the past.  Here for Medicare AWV:  1.   Risk factors based on Past M, S, F history:  history of hypertension, dyslipidemia.  He is a personal history of severe aortic stenosis and atrial fibrillation 2.   Physical Activities: remains quite active socially and physically, but has developed some dyspnea on exertion 3.   Depression/mood: no history of depression or mood disorder 4.   Hearing: no deficits 5.   ADL's: independent in all aspects of daily living 6.   Fall Risk: low 7.   Home Safety: no problems identified; will be relocating to an assisted living facility 8.   Height, weight, &visual acuity:no change in height.  Her weight; no difficulty with visual acuity 9.   Counseling: cardiology follow-up.  Encouraged 10.   Labs ordered based on risk factors: laboratory profile, including lipid panel will be reviewed 11.           Referral  Coordination- cardiology follow-up is scheduled 12.           Care Plan- heart healthy diet and salt restricted diet.  Encouraged;  will continue modified exercise regimen 13.            Cognitive Assessment- alert and oriented, with normal affect, no memory impairment; handles all executive functions without assistance  Allergies: 1)  ! * Diltiazem  Past History:  Past Medical History: Reviewed history from 04/01/2009 and no changes required. Atrial fibrillation Hypertension Benign prostatic hypertrophy Hyperlipidemia aortic stenosis-severe polymyalgia rheumatica Gout prostate cancer Fx L1, fx L foot  Past Surgical History: Reviewed history from 03/17/2008 and no changes required. Tonsillectomy Vasectomy L ankle surgery Cataract extraction  Family History: Reviewed history from 10/20/2007 and no changes required. both parents died in their 63s.  Father had coronary artery disease mother with heart failure.  One brother in his 29s  deceased from coronary artery disease  Social History: Reviewed history from 02/21/2009 and no changes required. Single presently residing at [facility]  Review of Systems       The patient complains of dyspnea on exertion.  The patient denies anorexia, fever, weight loss, weight gain, vision loss, decreased hearing, hoarseness, chest pain, syncope, peripheral edema, prolonged cough, headaches, hemoptysis, abdominal pain, melena, hematochezia, severe indigestion/heartburn,  hematuria, incontinence, genital sores, muscle weakness, suspicious skin lesions, transient blindness, difficulty walking, depression, unusual weight change, abnormal bleeding, enlarged lymph nodes, angioedema, breast masses, and testicular masses.    Physical Exam  General:  Well-developed,well-nourished,in no acute distress; alert,appropriate and cooperative throughout examination Head:  Normocephalic and atraumatic without obvious abnormalities. No apparent alopecia or  balding. Eyes:  No corneal or conjunctival inflammation noted. EOMI. Perrla. Funduscopic exam benign, without hemorrhages, exudates or papilledema. Vision grossly normal. small left conjunctiva with hemorrhage, right eye medially Ears:  External ear exam shows no significant lesions or deformities.  Otoscopic examination reveals clear canals, tympanic membranes are intact bilaterally without bulging, retraction, inflammation or discharge. Hearing is grossly normal bilaterally. Nose:  External nasal examination shows no deformity or inflammation. Nasal mucosa are pink and moist without lesions or exudates. Mouth:  Oral mucosa and oropharynx without lesions or exudates.  Teeth in good repair. Neck:  No deformities, masses, or tenderness noted. Chest Wall:  No deformities, masses, tenderness or gynecomastia noted. Breasts:  No masses or gynecomastia noted Lungs:  Normal respiratory effort, chest expands symmetrically. Lungs are clear to auscultation, no crackles or wheezes. Heart:  grade 3/6 harsh, systolic murmur heard diffusely with radiation into the carotid and supraclavicular distribution Abdomen:  Bowel sounds positive,abdomen soft and non-tender without masses, organomegaly or hernias noted. Rectal:  No external abnormalities noted. Normal sphincter tone. No rectal masses or tenderness. Genitalia:  Testes bilaterally descended without nodularity, tenderness or masses. No scrotal masses or lesions. No penis lesions or urethral discharge. Prostate:  2+ enlarged.  2+ enlarged.   Msk:  No deformity or scoliosis noted of thoracic or lumbar spine.   Pulses:  nonpalpable Extremities:  No clubbing, cyanosis, edema, or deformity noted with normal full range of motion of all joints.   Neurologic:  No cranial nerve deficits noted. Station and gait are normal. Plantar reflexes are down-going bilaterally. DTRs are symmetrical throughout. Sensory, motor and coordinative functions appear intact. Skin:  Intact  without suspicious lesions or rashes Cervical Nodes:  No lymphadenopathy noted Axillary Nodes:  No palpable lymphadenopathy Inguinal Nodes:  No significant adenopathy Psych:  Cognition and judgment appear intact. Alert and cooperative with normal attention span and concentration. No apparent delusions, illusions, hallucinations   Impression & Recommendations:  Problem # 1:  Preventive Health Care (ICD-V70.0)  Orders: Medicare -1st Annual Wellness Visit [phone]) UA Dipstick w/o Micro (automated)  (81003)  Problem # 2:  AORTIC STENOSIS, SEVERE (ICD-424.1)  His updated medication list for this problem includes:    Coumadin 5 Mg Tabs (Warfarin sodium) ..... Uad  His updated medication list for this problem includes:    Coumadin 5 Mg Tabs (Warfarin sodium) ..... Uad  Problem # 3:  HYPERLIPIDEMIA (ICD-272.4)  His updated medication list for this problem includes:    Welchol 625 Mg Tabs (Colesevelam hcl) .Marland Kitchen... 2 two times a day  His updated medication list for this problem includes:    Welchol 625 Mg Tabs (Colesevelam hcl) .Marland Kitchen... 2 two times a day  Problem # 4:  ATRIAL FIBRILLATION (ICD-427.31)  His updated medication list for this problem includes:    Lanoxin 0.125 Mg Tabs (Digoxin) .Marland Kitchen... 1/2 once daily    Coumadin 5 Mg Tabs (Warfarin sodium) ..... Uad  Orders: Protime ([id number])  His updated medication list for this problem includes:    Lanoxin 0.125 Mg Tabs (Digoxin) .Marland Kitchen... 1/2 once daily    Coumadin 5 Mg Tabs (Warfarin sodium) ..... Uad  Complete Medication List: 1)  Welchol 625 Mg Tabs (Colesevelam hcl) .... 2 two times a day 2)  Lanoxin 0.125 Mg Tabs (Digoxin) .... 1/2 once daily 3)  Claritin 10 Mg Caps (Loratadine) .Marland Kitchen.. 1 once daily as needed 4)  Coumadin 5 Mg Tabs (Warfarin sodium) .... Uad 5)  Tramadol Hcl 50 Mg Tabs (Tramadol hcl) .... One every 6 hours as needed for pain  Other Orders: Sedimentation Rate, non-automated ([address])  Patient Instructions: 1)  Limit  your Sodium (Salt). 2)  follow-up with cardiology next month as planned 3)  Please schedule a follow-up appointment in 3 months. Prescriptions: TRAMADOL HCL 50 MG TABS (TRAMADOL HCL) one every 6 hours as needed for pain  #90 x 6   Entered and Authorized by:   Gordy Savers  MD   Signed by:   Gordy Savers  MD on 02/13/2010   Method used:   Electronically to        [facility] [address] [phone]* (retail)       [address]       [place], [place]  [address]       Ph: [phone] or [phone]       Fax: [phone]   RxID:   [id number] COUMADIN 5 MG TABS (WARFARIN SODIUM) UAD  #30 x 6   Entered and Authorized by:   Gordy Savers  MD   Signed by:   Gordy Savers  MD on 02/13/2010   Method used:   Electronically to        [facility] [address] [phone]* (retail)       [address]       [place], [place]  [address]       Ph: [phone] or [phone]       Fax: [phone]   RxID:   [id number] LANOXIN 0.125 MG TABS (DIGOXIN) 1/2 once daily  #90.0 Each x 6   Entered and Authorized by:   Gordy Savers  MD   Signed by:   Gordy Savers  MD on 02/13/2010   Method used:   Electronically to        [facility] [address] [phone]* (retail)       [address]       [place], [place]  [address]       Ph: [phone] or [phone]       Fax: [phone]   RxID:   [id number] WELCHOL 625 MG TABS (COLESEVELAM HCL) 2 two times a day  #360.0 Each x 6   Entered and Authorized by:   Gordy Savers  MD   Signed by:   Gordy Savers  MD on 02/13/2010   Method used:   Electronically to        [facility] [address] [phone]* (retail)       [address]       [place], [place]  [address]       Ph: [phone] or [phone]       Fax: [phone]   RxID:   [id number]   Appended Document: emp---will fast//ccm/then to PT/pt rsc/cjr  Laboratory Results     Urine Tests  Date/Time Received: February 13, 2010 12:08 PM  Date/Time Reported: February 13, 2010 12:08 PM   Routine Urinalysis  Color: yellow Appearance: Clear Glucose: negative   (Normal Range: Negative) Bilirubin: negative   (Normal Range: Negative) Ketone: negative   (Normal Range: Negative) Spec. Gravity: 1.020   (Normal Range: 1.003-1.035) Blood: trace-lysed   (Normal Range: Negative) pH: 6.5   (Normal Range: 5.0-8.0) Protein: 1+   (Normal Range: Negative) Urobilinogen: 0.2   (Normal Range: 0-1) Nitrite: negative   (Normal Range: Negative) Leukocyte Esterace: negative   (Normal Range: Negative)    Comments: Wynona Canes, CMA  February 13, 2010 12:09 PM   Blood Tests     SED rate: 20 mm/hr  INR: 2.7   (Normal Range: 0.88-1.12   Therap INR: 2.0-3.5) Comments: Rita Ohara  February 13, 2010 10:49 AM       ANTICOAGULATION RECORD PREVIOUS REGIMEN & LAB RESULTS Anticoagulation Diagnosis:  v58.83,v58.61,427.31 on  12/26/2006 Previous INR Goal Range:  2.0-3.0 on  12/26/2006 Previous INR:  2.1 on  01/11/2010 Previous Coumadin Dose(mg):  5mg  QD on  12/09/2007 Previous Regimen:  same on  01/11/2010 Previous Coagulation Comments:  Dr. Clent Ridges approved on  02/04/2008  NEW REGIMEN & LAB RESULTS Current INR: 2.7 Regimen: same  Repeat testing in: 4 weeks  Anticoagulation Visit Questionnaire Coumadin dose missed/changed:  No Abnormal Bleeding Symptoms:  No  Any diet changes including alcohol intake, vegetables or greens since the last visit:  No Any illnesses or hospitalizations since the last visit:  No Any signs of clotting since the last visit (including chest discomfort, dizziness, shortness of breath, arm tingling, slurred speech, swelling or redness in leg):  No  MEDICATIONS WELCHOL 625 MG TABS (COLESEVELAM HCL) 2 two times a day LANOXIN 0.125 MG TABS (DIGOXIN) 1/2 once daily CLARITIN 10 MG CAPS (LORATADINE) 1 once daily as needed COUMADIN 5 MG  TABS (WARFARIN SODIUM) UAD TRAMADOL HCL 50 MG TABS (TRAMADOL HCL) one every 6 hours as needed for pain        Immunizations Administered:  PPD Skin Test:    Vaccine Type: PPD    Site: right forearm    Mfr: Sanofi Pasteur    Dose: 0.1 ml    Route: ID    Given by: Duard Brady LPN    Exp. Date: 04/06/2011    Lot #: [id number]    Physician counseled: yes  Tetanus Vaccine:    Vaccine Type: Tdap    Site: right deltoid    Mfr: GlaxoSmithKline    Dose: 0.5 ml    Route: IM    Given by: Duard Brady LPN    Exp. Date: 02/23/2012    Lot #: [id number]    VIS given: 04/21/08 version given February 13, 2010.    Physician counseled: yes  Pneumonia Vaccine:    Vaccine Type: Pneumovax    Site: right buttock    Mfr: Merck    Dose: 0.5 ml    Route: IM    Given by: Duard Brady LPN    Exp. Date: 06/25/2011    VIS given: 05/09/09 version given February 13, 2010.    Physician counseled: yes Flu Vaccine Consent Questions     Do you have a history of severe allergic reactions to this vaccine? no    Any prior history of allergic reactions to egg and/or gelatin? no    Do you have a sensitivity to the preservative Thimersol? no    Do you have a past history of Guillan-Barre Syndrome? no    Do you currently have an acute febrile illness? no    Have you ever  had a severe reaction to latex? no    Vaccine information given and explained to patient? yes    Are you currently pregnant? no    Lot Number:[id number]   Exp Date:12/02/2010   Site Given  Left Deltoid IM .lbmedflu   Laboratory Results   Urine Tests    Routine Urinalysis   Color: yellow Appearance: Clear Glucose: negative   (Normal Range: Negative) Bilirubin: negative   (Normal Range: Negative) Ketone: negative   (Normal Range: Negative) Spec. Gravity: 1.020   (Normal Range: 1.003-1.035) Blood: trace-lysed   (Normal Range: Negative) pH: 6.5   (Normal Range: 5.0-8.0) Protein: 1+   (Normal Range:  Negative) Urobilinogen: 0.2   (Normal Range: 0-1) Nitrite: negative   (Normal Range: Negative) Leukocyte Esterace: negative   (Normal Range: Negative)    Comments: Wynona Canes, CMA  February 13, 2010 12:09 PM   Blood Tests      INR: 2.7   (Normal Range: 0.88-1.12   Therap INR: 2.0-3.5) Comments: Rita Ohara  February 13, 2010 10:49 AM

## 2010-07-04 NOTE — Progress Notes (Signed)
  Phone Note Call from Patient   Caller: Patient Call For: Gordy Savers  MD Summary of Call: Concerned that he has been given generic Coumadin recently.  Remembers Dr. Kirtland Bouchard told him he would ask his Cardiologist what he thought about this.?  Advised to ask Dr. Patty Sermons. Initial call taken by: Lynann Beaver CMA,  January 24, 2010 2:37 PM  Follow-up for Phone Call        if patient has no objection, suggest continue branded coumadin Follow-up by: Gordy Savers  MD,  January 24, 2010 2:54 PM  Additional Follow-up for Phone Call Additional follow up Details #1::        Notified pt. Additional Follow-up by: Lynann Beaver CMA,  January 24, 2010 3:19 PM

## 2010-07-04 NOTE — Progress Notes (Signed)
Summary: info from ortho  Phone Note Call from Patient   Reason for Call: Refill Medication Summary of Call: Have  you received consultation report from Dr Amanda Pea from visit with him 4-27, and if so what do you think about it?  Pt number is [phone] Initial call taken by: Willy Eddy, LPN,  Oct 12, 2009 2:28 PM  Follow-up for Phone Call        have not received anything from ortho Follow-up by: Gordy Savers  MD,  Oct 13, 2009 8:11 AM  Additional Follow-up for Phone Call Additional follow up Details #1::        attempt to call pt - ans mach - LMTCB if questions - we have not rec'd anything from that doctor. please call them and have them send/re-send any info they feel Dr. Amador Cunas needs for reveiw. KIK Additional Follow-up by: Duard Brady LPN,  Oct 13, 2009 9:18 AM

## 2010-07-04 NOTE — Progress Notes (Signed)
Summary: refill digoxin  Phone Note Refill Request Message from:  Fax from Pharmacy on April 19, 2010 9:58 AM  Refills Requested: Medication #1:  LANOXIN 0.125 MG TABS 1/2 once daily   Last Refilled: 02/21/2010 [facility] [address]   Method Requested: Fax to Local Pharmacy Initial call taken by: Duard Brady LPN,  April 19, 2010 10:00 AM    Prescriptions: LANOXIN 0.125 MG TABS (DIGOXIN) 1/2 once daily  #90.0 Each x 6   Entered by:   Duard Brady LPN   Authorized by:   Gordy Savers  MD   Signed by:   Duard Brady LPN on 29/56/2130   Method used:   Faxed to ...       [facility] [address] [phone]* (retail)       [address]       [place], [place]  [address]       Ph: [phone]       Fax: [phone]   RxID:   [id number]

## 2010-07-04 NOTE — Medication Information (Signed)
Summary: Prior Authorization Request for Avapro  Prior Authorization Request for Avapro   Imported By: Maryln Gottron 08/01/2009 15:06:49  _____________________________________________________________________  External Attachment:    Type:   Image     Comment:   External Document

## 2010-07-04 NOTE — Assessment & Plan Note (Signed)
Summary: pt//ccm  Nurse Visit   Allergies: 1)  ! * Diltiazem Laboratory Results   Blood Tests   Date/Time Received: March 13, 2010 11:06 AM  Date/Time Reported: March 13, 2010 11:06 AM    INR: 3.0   (Normal Range: 0.88-1.12   Therap INR: 2.0-3.5) Comments: Wynona Canes, CMA  March 13, 2010 11:06 AM     Orders Added: 1)  Est. Patient Level I [99211] 2)  Protime [82956OZ]  Laboratory Results   Blood Tests      INR: 3.0   (Normal Range: 0.88-1.12   Therap INR: 2.0-3.5) Comments: Wynona Canes, CMA  March 13, 2010 11:06 AM       ANTICOAGULATION RECORD PREVIOUS REGIMEN & LAB RESULTS Anticoagulation Diagnosis:  v58.83,v58.61,427.31 on  12/26/2006 Previous INR Goal Range:  2.0-3.0 on  12/26/2006 Previous INR:  2.7 on  02/13/2010 Previous Coumadin Dose(mg):  5mg  QD on  12/09/2007 Previous Regimen:  same on  02/13/2010 Previous Coagulation Comments:  Dr. Clent Ridges approved on  02/04/2008  NEW REGIMEN & LAB RESULTS Current INR: 3.0 Regimen: same  (no change)       Repeat testing in: 4 weeks MEDICATIONS WELCHOL 625 MG TABS (COLESEVELAM HCL) 2 two times a day LANOXIN 0.125 MG TABS (DIGOXIN) 1/2 once daily CLARITIN 10 MG CAPS (LORATADINE) 1 once daily as needed COUMADIN 5 MG TABS (WARFARIN SODIUM) UAD TRAMADOL HCL 50 MG TABS (TRAMADOL HCL) one every 6 hours as needed for pain   Anticoagulation Visit Questionnaire      Coumadin dose missed/changed:  No      Abnormal Bleeding Symptoms:  No   Any diet changes including alcohol intake, vegetables or greens since the last visit:  No Any illnesses or hospitalizations since the last visit:  No Any signs of clotting since the last visit (including chest discomfort, dizziness, shortness of breath, arm tingling, slurred speech, swelling or redness in leg):  No

## 2010-07-04 NOTE — Progress Notes (Signed)
Summary: refill welchol  Phone Note Refill Request Message from:  Fax from Pharmacy on April 10, 2010 11:48 AM  Refills Requested: Medication #1:  WELCHOL 625 MG TABS 2 two times a day   Last Refilled: 02/07/2010 [facility]   Method Requested: Fax to Local Pharmacy Initial call taken by: Duard Brady LPN,  April 10, 2010 11:49 AM    Prescriptions: WELCHOL 625 MG TABS (COLESEVELAM HCL) 2 two times a day  #360.0 Each x 3   Entered by:   Duard Brady LPN   Authorized by:   Gordy Savers  MD   Signed by:   Duard Brady LPN on 16/03/9603   Method used:   Historical   RxID:   [id number]  [facility]ed [facility] to [facility] - was efilled 02/2010. KIK

## 2010-07-04 NOTE — Progress Notes (Signed)
Summary: ortho consult rec'd   Phone Note Call from Patient   Caller: Patient Call For: Gordy Savers  MD Reason for Call: Talk to Nurse Summary of Call: did we rec info from Dr. Amanda Pea?  Initial call taken by: Duard Brady LPN,  Oct 18, 2009 10:44 AM  Follow-up for Phone Call        we have recv'd and I see where Dr. Amador Cunas has reviewed.  Follow-up by: Duard Brady LPN,  Oct 18, 2009 10:47 AM

## 2010-07-04 NOTE — Progress Notes (Signed)
Summary: warfarin  Phone Note Refill Request Message from:  Fax from Pharmacy on January 12, 2010 4:09 PM  Refills Requested: Medication #1:  COUMADIN 5 MG TABS UAD   Last Refilled: 01/02/2010 [facility]    Method Requested: Fax to Local Pharmacy Initial call taken by: Duard Brady LPN,  January 12, 2010 4:09 PM  Follow-up for Phone Call        denied - was filled 01/02/10 [facility] [address] with 6 refill Follow-up by: Duard Brady LPN,  January 12, 2010 4:10 PM

## 2010-07-04 NOTE — Letter (Signed)
Summary: [facility]  [facility]   Imported By: Maryln Gottron 05/11/2010 10:10:03  _____________________________________________________________________  External Attachment:    Type:   Image     Comment:   External Document

## 2010-07-04 NOTE — Assessment & Plan Note (Signed)
Summary: 4 mo rov/mm/pt rescd--add pt//ccm   Vital Signs:  Patient profile:   77 year old male Weight:      171 pounds Temp:     98.1 degrees F oral BP sitting:   128 / 80  (right arm) Cuff size:   regular  Vitals Entered By: Duard Brady LPN (Oct 03, 1608 10:38 AM) CC: 4 mos rov - cough, sinus issues , upper back pain and (L) hip pain Is Patient Diabetic? No   CC:  4 mos rov - cough, sinus issues , and upper back pain and (L) hip pain.  History of Present Illness: 77 year old patient who is seen today for follow-up.  He has been evaluating [place] by orthopedics due to low back pain.  He is scheduled to see cardiology next week for follow up of his severe aortic stenosis.  He has chronic atrial fibrillation  on chronic Coumadin therapy.  He has treated hypertension.  His main complaint is low back and left hip pain.  he does have some mild dyspnea on exertion, but also a problem with the recent allergies.  Allergies (verified): No Known Drug Allergies  Past History:  Past Medical History: Reviewed history from 04/01/2009 and no changes required. Atrial fibrillation Hypertension Benign prostatic hypertrophy Hyperlipidemia aortic stenosis-severe polymyalgia rheumatica Gout prostate cancer Fx L1, fx L foot  Family History: Reviewed history from 10/20/2007 and no changes required. both parents died in their 39s.  Father had coronary artery disease mother with heart failure.  One brother in his 36s  deceased from coronary artery disease  Review of Systems       The patient complains of difficulty walking.  The patient denies anorexia, fever, weight loss, weight gain, vision loss, decreased hearing, hoarseness, chest pain, syncope, dyspnea on exertion, peripheral edema, prolonged cough, headaches, hemoptysis, abdominal pain, melena, hematochezia, severe indigestion/heartburn, hematuria, incontinence, genital sores, muscle weakness, suspicious skin lesions, transient  blindness, depression, unusual weight change, abnormal bleeding, enlarged lymph nodes, angioedema, breast masses, and testicular masses.    Physical Exam  General:  Well-developed,well-nourished,in no acute distress; alert,appropriate and cooperative throughout examination Head:  Normocephalic and atraumatic without obvious abnormalities. No apparent alopecia or balding. Mouth:  Oral mucosa and oropharynx without lesions or exudates.  Teeth in good repair. Neck:  No deformities, masses, or tenderness noted. Lungs:  Normal respiratory effort, chest expands symmetrically. Lungs are clear to auscultation, no crackles or wheezes. Heart:  grade 3 to 4/6 systolic murmur Abdomen:  Bowel sounds positive,abdomen soft and non-tender without masses, organomegaly or hernias noted. Msk:  No deformity or scoliosis noted of thoracic or lumbar spine.   Pulses:  R and L carotid,radial,femoral,dorsalis pedis and posterior tibial pulses are full and equal bilaterally   Impression & Recommendations:  Problem # 1:  BACK PAIN (ICD-724.5)  His updated medication list for this problem includes:    Tramadol Hcl 50 Mg Tabs (Tramadol hcl) ..... One every 6 hours as needed for pain  Orders: Prescription Created Electronically [phone])  Problem # 2:  HYPERTENSION (ICD-401.9)  His updated medication list for this problem includes:    Hyzaar 100-25 Mg Tabs (Losartan potassium-hctz) ..... One by mouth daily  His updated medication list for this problem includes:    Hyzaar 100-25 Mg Tabs (Losartan potassium-hctz) ..... One by mouth daily  Problem # 3:  ATRIAL FIBRILLATION (ICD-427.31)  His updated medication list for this problem includes:    Lanoxin 0.125 Mg Tabs (Digoxin) .Marland Kitchen... 1/2 once daily  Coumadin 5 Mg Tabs (Warfarin sodium) ..... Uad  Orders: Protime (16109UE) Fingerstick [phone])  His updated medication list for this problem includes:    Lanoxin 0.125 Mg Tabs (Digoxin) .Marland Kitchen... 1/2 once daily     Coumadin 5 Mg Tabs (Warfarin sodium) ..... Uad  Complete Medication List: 1)  Welchol 625 Mg Tabs (Colesevelam hcl) .... 2 two times a day 2)  Colchicine 0.6 Mg Tabs (Colchicine) .... One by mouth three times daily 3)  Lanoxin 0.125 Mg Tabs (Digoxin) .... 1/2 once daily 4)  Claritin 10 Mg Caps (Loratadine) .Marland Kitchen.. 1 once daily as needed 5)  Levitra 10 Mg Tabs (Vardenafil hcl) .... One daily as directed 6)  Coumadin 5 Mg Tabs (Warfarin sodium) .... Uad 7)  Hyzaar 100-25 Mg Tabs (Losartan potassium-hctz) .... One by mouth daily 8)  Tramadol Hcl 50 Mg Tabs (Tramadol hcl) .... One every 6 hours as needed for pain  Patient Instructions: 1)  Please schedule a follow-up appointment in 6 months. 2)  Limit your Sodium (Salt) to less than 2 grams a day(slightly less than 1/2 a teaspoon) to prevent fluid retention, swelling, or worsening of symptoms. 3)  It is important that you exercise regularly at least 20 minutes 5 times a week. If you develop chest pain, have severe difficulty breathing, or feel very tired , stop exercising immediately and seek medical attention. 4)  You need to lose weight. Consider a lower calorie diet and regular exercise.  Prescriptions: TRAMADOL HCL 50 MG TABS (TRAMADOL HCL) one every 6 hours as needed for pain  #90 x 6   Entered and Authorized by:   Gordy Savers  MD   Signed by:   Gordy Savers  MD on 10/03/2009   Method used:   Electronically to        [facility] [address] [phone]* (retail)       [address]       [place], [place]  [address]       Ph: [phone] or [phone]       Fax: [phone]   RxID:   [id number]    ANTICOAGULATION RECORD PREVIOUS REGIMEN & LAB RESULTS Anticoagulation Diagnosis:  v58.83,v58.61,427.31 on  12/26/2006 Previous INR Goal Range:  2.0-3.0 on  12/26/2006 Previous INR:  2.7 on  04/11/2009 Previous Coumadin Dose(mg):  5mg  QD on  12/09/2007 Previous Regimen:  same on  03/05/2008 Previous  Coagulation Comments:  Dr. Clent Ridges approved on  02/04/2008  NEW REGIMEN & LAB RESULTS Current INR: 2.7 Regimen: same  Repeat testing in: 1 month  Anticoagulation Visit Questionnaire Coumadin dose missed/changed:  No Abnormal Bleeding Symptoms:  No  Any diet changes including alcohol intake, vegetables or greens since the last visit:  No Any illnesses or hospitalizations since the last visit:  No Any signs of clotting since the last visit (including chest discomfort, dizziness, shortness of breath, arm tingling, slurred speech, swelling or redness in leg):  No  MEDICATIONS WELCHOL 625 MG TABS (COLESEVELAM HCL) 2 two times a day COLCHICINE 0.6 MG TABS (COLCHICINE) one by mouth three times daily LANOXIN 0.125 MG TABS (DIGOXIN) 1/2 once daily CLARITIN 10 MG CAPS (LORATADINE) 1 once daily as needed LEVITRA 10 MG TABS (VARDENAFIL HCL) one daily as directed COUMADIN 5 MG TABS (WARFARIN SODIUM) UAD HYZAAR 100-25 MG TABS (LOSARTAN POTASSIUM-HCTZ) one by mouth daily TRAMADOL HCL 50 MG TABS (TRAMADOL HCL) one every 6 hours as needed for pain  Laboratory Results   Blood Tests     PT: 19.9 s   (Normal Range: 10.6-13.4)  INR: 2.7   (Normal Range: 0.88-1.12   Therap INR: 2.0-3.5) Comments: Rita Ohara  Oct 03, 2009 9:53 AM

## 2010-07-04 NOTE — Assessment & Plan Note (Signed)
Summary: ACUTE/NEW BP MED/BREATHING ISSUES/RCD   Vital Signs:  Patient profile:   77 year old male Weight:      175 pounds Temp:     98.3 degrees F oral BP sitting:   128 / 72  (left arm) Cuff size:   regular  Vitals Entered By: Duard Brady LPN (Nov 01, 2009 11:00 AM) CC: SOB with new BP med -stopped taking Saturday , still has cough and constipation : was on Losartin switched to Diltiazem Is Patient Diabetic? No   CC:  SOB with new BP med -stopped taking Saturday  and still has cough and constipation : was on Losartin switched to Diltiazem.  History of Present Illness: 77 year old patient who has a history of chronic atrial fibrillation  and hypertension.  Cozaar  was substituted for Avalide, which he has been on for some time which apparently has been discontinued.  Due to a mild cough diltiazem, was substituted approximately 1 week ago.  two nights ago,  The patient had an episode of shortness of breath.  This was associated with a normal irregular rate.  Over the weekend.  He felt somewhat weaker than usual and has not taken any further medication.  He has had no further shortness of breath.  He denies any other symptoms of heart failure.  He is on chronic Coumadin.  He does have a history of severe aortic stenosis  Allergies (verified): 1)  ! * Diltiazem  Past History:  Past Medical History: Reviewed history from 04/01/2009 and no changes required. Atrial fibrillation Hypertension Benign prostatic hypertrophy Hyperlipidemia aortic stenosis-severe polymyalgia rheumatica Gout prostate cancer Fx L1, fx L foot  Review of Systems       The patient complains of dyspnea on exertion and muscle weakness.  The patient denies anorexia, fever, weight loss, weight gain, vision loss, decreased hearing, hoarseness, chest pain, syncope, peripheral edema, prolonged cough, headaches, hemoptysis, abdominal pain, melena, hematochezia, severe indigestion/heartburn, hematuria,  incontinence, genital sores, suspicious skin lesions, transient blindness, difficulty walking, depression, unusual weight change, abnormal bleeding, enlarged lymph nodes, angioedema, breast masses, and testicular masses.    Physical Exam  General:  Well-developed,well-nourished,in no acute distress; alert,appropriate and cooperative throughout examination Head:  Normocephalic and atraumatic without obvious abnormalities. No apparent alopecia or balding. Eyes:  No corneal or conjunctival inflammation noted. EOMI. Perrla. Funduscopic exam benign, without hemorrhages, exudates or papilledema. Vision grossly normal. Mouth:  Oral mucosa and oropharynx without lesions or exudates.  Teeth in good repair. Neck:  No deformities, masses, or tenderness noted. Lungs:  few crackles, left base O2 saturation 95 Heart:  irregular rhythm, with a controlled ventricular response grade 3/6 systolic murmur Abdomen:  Bowel sounds positive,abdomen soft and non-tender without masses, organomegaly or hernias noted.   Impression & Recommendations:  Problem # 1:  DYSPNEA (ICD-786.05)  this has now resolved, and has not recurred over the past 3 days.  It is unclear whether this was related to a change in his medical regimen or possibly a manifestation of his aortic valve disease.  Will check lab including a BNP today  Problem # 2:  HYPERTENSION (ICD-401.9)  The following medications were removed from the medication list:    Diltiazem Hcl Er Beads 240 Mg Xr24h-cap (Diltiazem hcl er beads) ..... Qd  The following medications were removed from the medication list:    Diltiazem Hcl Er Beads 240 Mg Xr24h-cap (Diltiazem hcl er beads) ..... Qd  Problem # 3:  ATRIAL FIBRILLATION (ICD-427.31)  The following medications were removed  from the medication list:    Diltiazem Hcl Er Beads 240 Mg Xr24h-cap (Diltiazem hcl er beads) ..... Qd His updated medication list for this problem includes:    Lanoxin 0.125 Mg Tabs  (Digoxin) .Marland Kitchen... 1/2 once daily    Coumadin 5 Mg Tabs (Warfarin sodium) ..... Uad  The following medications were removed from the medication list:    Diltiazem Hcl Er Beads 240 Mg Xr24h-cap (Diltiazem hcl er beads) ..... Qd His updated medication list for this problem includes:    Lanoxin 0.125 Mg Tabs (Digoxin) .Marland Kitchen... 1/2 once daily    Coumadin 5 Mg Tabs (Warfarin sodium) ..... Uad  Problem # 4:  AORTIC STENOSIS, SEVERE (ICD-424.1)  His updated medication list for this problem includes:    Coumadin 5 Mg Tabs (Warfarin sodium) ..... Uad    His updated medication list for this problem includes:    Coumadin 5 Mg Tabs (Warfarin sodium) ..... Uad  Complete Medication List: 1)  Welchol 625 Mg Tabs (Colesevelam hcl) .... 2 two times a day 2)  Colchicine 0.6 Mg Tabs (Colchicine) .... One by mouth three times daily 3)  Lanoxin 0.125 Mg Tabs (Digoxin) .... 1/2 once daily 4)  Claritin 10 Mg Caps (Loratadine) .Marland Kitchen.. 1 once daily as needed 5)  Levitra 10 Mg Tabs (Vardenafil hcl) .... One daily as directed 6)  Coumadin 5 Mg Tabs (Warfarin sodium) .... Uad 7)  Tramadol Hcl 50 Mg Tabs (Tramadol hcl) .... One every 6 hours as needed for pain  Other Orders: Venipuncture (44010) TLB-BMP (Basic Metabolic Panel-BMET) (80048-METABOL) TLB-CBC Platelet - w/Differential (85025-CBCD) TLB-Hepatic/Liver Function Pnl (80076-HEPATIC) TLB-TSH (Thyroid Stimulating Hormone) (84443-TSH) TLB-BNP (B-Natriuretic Peptide) (83880-BNPR) TLB-Sedimentation Rate (ESR) (85652-ESR)  Patient Instructions: 1)  Please schedule a follow-up appointment in 2 weeks. 2)  Limit your Sodium (Salt).

## 2010-07-04 NOTE — Letter (Signed)
Summary: [facility]   Imported By: Maryln Gottron 10/18/2009 10:46:09  _____________________________________________________________________  External Attachment:    Type:   Image     Comment:   External Document

## 2010-07-04 NOTE — Assessment & Plan Note (Signed)
Summary: 2 WK FOLLOW UP/CJR   Vital Signs:  Patient profile:   78 year old male Weight:      171 pounds Temp:     98.4 degrees F oral BP sitting:   120 / 72  (left arm) Cuff size:   regular  Vitals Entered By: Duard Brady LPN (November 15, 2009 1:00 PM) CC: f/u - doing great Is Patient Diabetic? No   CC:  f/u - doing great.  History of Present Illness: 77 year old patient who is seen today for follow-up.  He has hypertension.  Her last visit.  Diltiazem was discontinued due to perceived side effects.  He is in today basically reassess his blood pressure.  He has chronic atrial fibrillation  and now is on Lanoxin  for rate control.  He feels quite well today.  His only complaint is some low back pain  Allergies: 1)  ! * Diltiazem  Past History:  Past Medical History: Reviewed history from 04/01/2009 and no changes required. Atrial fibrillation Hypertension Benign prostatic hypertrophy Hyperlipidemia aortic stenosis-severe polymyalgia rheumatica Gout prostate cancer Fx L1, fx L foot  Physical Exam  General:  Well-developed,well-nourished,in no acute distress; alert,appropriate and cooperative throughout examination Head:  Normocephalic and atraumatic without obvious abnormalities. No apparent alopecia or balding. Mouth:  Oral mucosa and oropharynx without lesions or exudates.  Teeth in good repair. Neck:  No deformities, masses, or tenderness noted. Lungs:  Normal respiratory effort, chest expands symmetrically. Lungs are clear to auscultation, no crackles or wheezes. Heart:  irregular rhythm, with a controlled ventricular response   Impression & Recommendations:  Problem # 1:  AORTIC STENOSIS, SEVERE (ICD-424.1)  His updated medication list for this problem includes:    Coumadin 5 Mg Tabs (Warfarin sodium) ..... Uad  His updated medication list for this problem includes:    Coumadin 5 Mg Tabs (Warfarin sodium) ..... Uad  Problem # 2:  HYPERTENSION  (ICD-401.9)  Problem # 3:  ATRIAL FIBRILLATION (ICD-427.31)  His updated medication list for this problem includes:    Lanoxin 0.125 Mg Tabs (Digoxin) .Marland Kitchen... 1/2 once daily    Coumadin 5 Mg Tabs (Warfarin sodium) ..... Uad  His updated medication list for this problem includes:    Lanoxin 0.125 Mg Tabs (Digoxin) .Marland Kitchen... 1/2 once daily    Coumadin 5 Mg Tabs (Warfarin sodium) ..... Uad  Complete Medication List: 1)  Welchol 625 Mg Tabs (Colesevelam hcl) .... 2 two times a day 2)  Lanoxin 0.125 Mg Tabs (Digoxin) .... 1/2 once daily 3)  Claritin 10 Mg Caps (Loratadine) .Marland Kitchen.. 1 once daily as needed 4)  Coumadin 5 Mg Tabs (Warfarin sodium) .... Uad 5)  Tramadol Hcl 50 Mg Tabs (Tramadol hcl) .... One every 6 hours as needed for pain  Patient Instructions: 1)  Please schedule a follow-up appointment in 4 months. 2)  Limit your Sodium (Salt) to less than 2 grams a day(slightly less than 1/2 a teaspoon) to prevent fluid retention, swelling, or worsening of symptoms. 3)  It is important that you exercise regularly at least 20 minutes 5 times a week. If you develop chest pain, have severe difficulty breathing, or feel very tired , stop exercising immediately and seek medical attention. 4)  monthly prothrombin times

## 2010-07-04 NOTE — Letter (Signed)
Summary: [facility]  [facility]   Imported By: Maryln Gottron 10/17/2009 10:22:55  _____________________________________________________________________  External Attachment:    Type:   Image     Comment:   External Document

## 2010-07-04 NOTE — Assessment & Plan Note (Signed)
Summary: tb reading/njr  Nurse Visit   Vitals Entered By: Duard Brady LPN (February 15, 2010 9:15 AM)  Allergies: 1)  ! * Diltiazem  PPD Results    Date of reading: 02/15/2010    Results: < 5mm    Interpretation: negative

## 2010-07-05 ENCOUNTER — Encounter (HOSPITAL_COMMUNITY): Payer: Medicare Other | Attending: Cardiology

## 2010-07-05 DIAGNOSIS — I129 Hypertensive chronic kidney disease with stage 1 through stage 4 chronic kidney disease, or unspecified chronic kidney disease: Secondary | ICD-10-CM | POA: Insufficient documentation

## 2010-07-05 DIAGNOSIS — Z954 Presence of other heart-valve replacement: Secondary | ICD-10-CM | POA: Insufficient documentation

## 2010-07-05 DIAGNOSIS — Z7901 Long term (current) use of anticoagulants: Secondary | ICD-10-CM | POA: Insufficient documentation

## 2010-07-05 DIAGNOSIS — I079 Rheumatic tricuspid valve disease, unspecified: Secondary | ICD-10-CM | POA: Insufficient documentation

## 2010-07-05 DIAGNOSIS — Z87891 Personal history of nicotine dependence: Secondary | ICD-10-CM | POA: Insufficient documentation

## 2010-07-05 DIAGNOSIS — Z5189 Encounter for other specified aftercare: Secondary | ICD-10-CM | POA: Insufficient documentation

## 2010-07-05 DIAGNOSIS — I080 Rheumatic disorders of both mitral and aortic valves: Secondary | ICD-10-CM | POA: Insufficient documentation

## 2010-07-05 DIAGNOSIS — I509 Heart failure, unspecified: Secondary | ICD-10-CM | POA: Insufficient documentation

## 2010-07-05 DIAGNOSIS — N189 Chronic kidney disease, unspecified: Secondary | ICD-10-CM | POA: Insufficient documentation

## 2010-07-05 DIAGNOSIS — I498 Other specified cardiac arrhythmias: Secondary | ICD-10-CM | POA: Insufficient documentation

## 2010-07-05 DIAGNOSIS — Z7982 Long term (current) use of aspirin: Secondary | ICD-10-CM | POA: Insufficient documentation

## 2010-07-05 DIAGNOSIS — I4891 Unspecified atrial fibrillation: Secondary | ICD-10-CM | POA: Insufficient documentation

## 2010-07-06 NOTE — Letter (Signed)
Summary: Report of Lipids/[facility]  Report of Lipids/[facility]   Imported By: Maryln Gottron 06/30/2010 14:21:08  _____________________________________________________________________  External Attachment:    Type:   Image     Comment:   External Document

## 2010-07-07 ENCOUNTER — Encounter (HOSPITAL_COMMUNITY): Payer: Medicare Other

## 2010-07-07 NOTE — Letter (Signed)
Summary: [facility]  [facility]   Imported By: Maryln Gottron 10/19/2009 13:12:35  _____________________________________________________________________  External Attachment:    Type:   Image     Comment:   External Document

## 2010-07-10 ENCOUNTER — Encounter (HOSPITAL_COMMUNITY): Payer: Medicare Other

## 2010-07-12 ENCOUNTER — Encounter (HOSPITAL_COMMUNITY): Payer: Medicare Other

## 2010-07-12 ENCOUNTER — Encounter (HOSPITAL_COMMUNITY): Payer: Medicare Other | Attending: Cardiology

## 2010-07-12 DIAGNOSIS — N189 Chronic kidney disease, unspecified: Secondary | ICD-10-CM | POA: Insufficient documentation

## 2010-07-12 DIAGNOSIS — Z5189 Encounter for other specified aftercare: Secondary | ICD-10-CM | POA: Insufficient documentation

## 2010-07-12 DIAGNOSIS — I129 Hypertensive chronic kidney disease with stage 1 through stage 4 chronic kidney disease, or unspecified chronic kidney disease: Secondary | ICD-10-CM | POA: Insufficient documentation

## 2010-07-12 DIAGNOSIS — I080 Rheumatic disorders of both mitral and aortic valves: Secondary | ICD-10-CM | POA: Insufficient documentation

## 2010-07-12 DIAGNOSIS — Z954 Presence of other heart-valve replacement: Secondary | ICD-10-CM | POA: Insufficient documentation

## 2010-07-12 DIAGNOSIS — I4891 Unspecified atrial fibrillation: Secondary | ICD-10-CM | POA: Insufficient documentation

## 2010-07-12 DIAGNOSIS — Z7901 Long term (current) use of anticoagulants: Secondary | ICD-10-CM | POA: Insufficient documentation

## 2010-07-12 DIAGNOSIS — I509 Heart failure, unspecified: Secondary | ICD-10-CM | POA: Insufficient documentation

## 2010-07-12 DIAGNOSIS — I079 Rheumatic tricuspid valve disease, unspecified: Secondary | ICD-10-CM | POA: Insufficient documentation

## 2010-07-12 DIAGNOSIS — I498 Other specified cardiac arrhythmias: Secondary | ICD-10-CM | POA: Insufficient documentation

## 2010-07-12 DIAGNOSIS — Z87891 Personal history of nicotine dependence: Secondary | ICD-10-CM | POA: Insufficient documentation

## 2010-07-12 DIAGNOSIS — Z7982 Long term (current) use of aspirin: Secondary | ICD-10-CM | POA: Insufficient documentation

## 2010-07-13 ENCOUNTER — Ambulatory Visit (INDEPENDENT_AMBULATORY_CARE_PROVIDER_SITE_OTHER): Payer: Medicare Other | Admitting: *Deleted

## 2010-07-13 DIAGNOSIS — I4891 Unspecified atrial fibrillation: Secondary | ICD-10-CM

## 2010-07-13 DIAGNOSIS — Z954 Presence of other heart-valve replacement: Secondary | ICD-10-CM

## 2010-07-13 DIAGNOSIS — R609 Edema, unspecified: Secondary | ICD-10-CM

## 2010-07-14 ENCOUNTER — Encounter (HOSPITAL_COMMUNITY): Payer: Medicare Other

## 2010-07-17 ENCOUNTER — Encounter (HOSPITAL_COMMUNITY): Payer: Medicare Other

## 2010-07-19 ENCOUNTER — Encounter (HOSPITAL_COMMUNITY): Payer: Medicare Other

## 2010-07-21 ENCOUNTER — Encounter (HOSPITAL_COMMUNITY): Payer: Medicare Other

## 2010-07-24 ENCOUNTER — Encounter (HOSPITAL_COMMUNITY): Payer: Medicare Other

## 2010-07-26 ENCOUNTER — Encounter (HOSPITAL_COMMUNITY): Payer: Medicare Other

## 2010-07-28 ENCOUNTER — Encounter (HOSPITAL_COMMUNITY): Payer: Medicare Other

## 2010-07-31 ENCOUNTER — Encounter (HOSPITAL_COMMUNITY): Payer: Medicare Other

## 2010-08-02 ENCOUNTER — Encounter (HOSPITAL_COMMUNITY): Payer: Medicare Other

## 2010-08-04 ENCOUNTER — Encounter (HOSPITAL_COMMUNITY): Payer: Medicare Other | Attending: Cardiology

## 2010-08-04 DIAGNOSIS — Z7982 Long term (current) use of aspirin: Secondary | ICD-10-CM | POA: Insufficient documentation

## 2010-08-04 DIAGNOSIS — Z5189 Encounter for other specified aftercare: Secondary | ICD-10-CM | POA: Insufficient documentation

## 2010-08-04 DIAGNOSIS — I498 Other specified cardiac arrhythmias: Secondary | ICD-10-CM | POA: Insufficient documentation

## 2010-08-04 DIAGNOSIS — I129 Hypertensive chronic kidney disease with stage 1 through stage 4 chronic kidney disease, or unspecified chronic kidney disease: Secondary | ICD-10-CM | POA: Insufficient documentation

## 2010-08-04 DIAGNOSIS — N189 Chronic kidney disease, unspecified: Secondary | ICD-10-CM | POA: Insufficient documentation

## 2010-08-04 DIAGNOSIS — Z87891 Personal history of nicotine dependence: Secondary | ICD-10-CM | POA: Insufficient documentation

## 2010-08-04 DIAGNOSIS — I509 Heart failure, unspecified: Secondary | ICD-10-CM | POA: Insufficient documentation

## 2010-08-04 DIAGNOSIS — I4891 Unspecified atrial fibrillation: Secondary | ICD-10-CM | POA: Insufficient documentation

## 2010-08-04 DIAGNOSIS — I080 Rheumatic disorders of both mitral and aortic valves: Secondary | ICD-10-CM | POA: Insufficient documentation

## 2010-08-04 DIAGNOSIS — Z7901 Long term (current) use of anticoagulants: Secondary | ICD-10-CM | POA: Insufficient documentation

## 2010-08-04 DIAGNOSIS — I079 Rheumatic tricuspid valve disease, unspecified: Secondary | ICD-10-CM | POA: Insufficient documentation

## 2010-08-04 DIAGNOSIS — Z954 Presence of other heart-valve replacement: Secondary | ICD-10-CM | POA: Insufficient documentation

## 2010-08-07 ENCOUNTER — Encounter (HOSPITAL_COMMUNITY): Payer: Medicare Other

## 2010-08-09 ENCOUNTER — Encounter (HOSPITAL_COMMUNITY): Payer: Medicare Other

## 2010-08-10 ENCOUNTER — Encounter (INDEPENDENT_AMBULATORY_CARE_PROVIDER_SITE_OTHER): Payer: Medicare Other

## 2010-08-10 DIAGNOSIS — Z7901 Long term (current) use of anticoagulants: Secondary | ICD-10-CM

## 2010-08-10 DIAGNOSIS — I4891 Unspecified atrial fibrillation: Secondary | ICD-10-CM

## 2010-08-11 ENCOUNTER — Encounter (HOSPITAL_COMMUNITY): Payer: Medicare Other

## 2010-08-14 ENCOUNTER — Encounter (HOSPITAL_COMMUNITY): Payer: Medicare Other

## 2010-08-15 LAB — CBC
HCT: 33.5 % — ABNORMAL LOW (ref 39.0–52.0)
HCT: 33.6 % — ABNORMAL LOW (ref 39.0–52.0)
HCT: 34.3 % — ABNORMAL LOW (ref 39.0–52.0)
HCT: 35.1 % — ABNORMAL LOW (ref 39.0–52.0)
HCT: 35.4 % — ABNORMAL LOW (ref 39.0–52.0)
HCT: 35.5 % — ABNORMAL LOW (ref 39.0–52.0)
HCT: 37.7 % — ABNORMAL LOW (ref 39.0–52.0)
Hemoglobin: 10.9 g/dL — ABNORMAL LOW (ref 13.0–17.0)
Hemoglobin: 11.1 g/dL — ABNORMAL LOW (ref 13.0–17.0)
Hemoglobin: 11.4 g/dL — ABNORMAL LOW (ref 13.0–17.0)
Hemoglobin: 11.7 g/dL — ABNORMAL LOW (ref 13.0–17.0)
Hemoglobin: 11.8 g/dL — ABNORMAL LOW (ref 13.0–17.0)
MCH: 27.8 pg (ref 26.0–34.0)
MCH: 28 pg (ref 26.0–34.0)
MCH: 28.2 pg (ref 26.0–34.0)
MCH: 28.2 pg (ref 26.0–34.0)
MCH: 28.4 pg (ref 26.0–34.0)
MCHC: 32 g/dL (ref 30.0–36.0)
MCHC: 32.1 g/dL (ref 30.0–36.0)
MCHC: 32.5 g/dL (ref 30.0–36.0)
MCHC: 32.7 g/dL (ref 30.0–36.0)
MCHC: 33.3 g/dL (ref 30.0–36.0)
MCV: 84.6 fL (ref 78.0–100.0)
MCV: 86 fL (ref 78.0–100.0)
MCV: 86.3 fL (ref 78.0–100.0)
MCV: 86.4 fL (ref 78.0–100.0)
MCV: 86.8 fL (ref 78.0–100.0)
MCV: 86.8 fL (ref 78.0–100.0)
MCV: 87.1 fL (ref 78.0–100.0)
MCV: 87.4 fL (ref 78.0–100.0)
MCV: 87.4 fL (ref 78.0–100.0)
Platelets: 101 10*3/uL — ABNORMAL LOW (ref 150–400)
Platelets: 110 10*3/uL — ABNORMAL LOW (ref 150–400)
Platelets: 116 10*3/uL — ABNORMAL LOW (ref 150–400)
Platelets: 123 10*3/uL — ABNORMAL LOW (ref 150–400)
Platelets: 140 10*3/uL — ABNORMAL LOW (ref 150–400)
Platelets: 192 10*3/uL (ref 150–400)
RBC: 3.87 MIL/uL — ABNORMAL LOW (ref 4.22–5.81)
RBC: 3.89 MIL/uL — ABNORMAL LOW (ref 4.22–5.81)
RBC: 3.95 MIL/uL — ABNORMAL LOW (ref 4.22–5.81)
RBC: 4.06 MIL/uL — ABNORMAL LOW (ref 4.22–5.81)
RBC: 4.33 MIL/uL (ref 4.22–5.81)
RBC: 4.46 MIL/uL (ref 4.22–5.81)
RBC: 4.59 MIL/uL (ref 4.22–5.81)
RDW: 16.5 % — ABNORMAL HIGH (ref 11.5–15.5)
RDW: 17 % — ABNORMAL HIGH (ref 11.5–15.5)
RDW: 17.1 % — ABNORMAL HIGH (ref 11.5–15.5)
RDW: 17.1 % — ABNORMAL HIGH (ref 11.5–15.5)
WBC: 10 10*3/uL (ref 4.0–10.5)
WBC: 10.7 10*3/uL — ABNORMAL HIGH (ref 4.0–10.5)
WBC: 11.3 10*3/uL — ABNORMAL HIGH (ref 4.0–10.5)
WBC: 6.7 10*3/uL (ref 4.0–10.5)
WBC: 8.6 10*3/uL (ref 4.0–10.5)
WBC: 9.2 10*3/uL (ref 4.0–10.5)
WBC: 9.7 10*3/uL (ref 4.0–10.5)

## 2010-08-15 LAB — BASIC METABOLIC PANEL
BUN: 32 mg/dL — ABNORMAL HIGH (ref 6–23)
BUN: 34 mg/dL — ABNORMAL HIGH (ref 6–23)
BUN: 35 mg/dL — ABNORMAL HIGH (ref 6–23)
BUN: 36 mg/dL — ABNORMAL HIGH (ref 6–23)
BUN: 37 mg/dL — ABNORMAL HIGH (ref 6–23)
BUN: 38 mg/dL — ABNORMAL HIGH (ref 6–23)
BUN: 38 mg/dL — ABNORMAL HIGH (ref 6–23)
BUN: 42 mg/dL — ABNORMAL HIGH (ref 6–23)
BUN: 43 mg/dL — ABNORMAL HIGH (ref 6–23)
CO2: 28 mEq/L (ref 19–32)
CO2: 28 mEq/L (ref 19–32)
CO2: 29 mEq/L (ref 19–32)
CO2: 29 mEq/L (ref 19–32)
CO2: 29 mEq/L (ref 19–32)
CO2: 32 mEq/L (ref 19–32)
CO2: 33 mEq/L — ABNORMAL HIGH (ref 19–32)
CO2: 33 mEq/L — ABNORMAL HIGH (ref 19–32)
CO2: 35 mEq/L — ABNORMAL HIGH (ref 19–32)
CO2: 36 mEq/L — ABNORMAL HIGH (ref 19–32)
Calcium: 8.1 mg/dL — ABNORMAL LOW (ref 8.4–10.5)
Calcium: 8.3 mg/dL — ABNORMAL LOW (ref 8.4–10.5)
Calcium: 8.4 mg/dL (ref 8.4–10.5)
Calcium: 8.5 mg/dL (ref 8.4–10.5)
Calcium: 8.5 mg/dL (ref 8.4–10.5)
Calcium: 8.5 mg/dL (ref 8.4–10.5)
Calcium: 8.5 mg/dL (ref 8.4–10.5)
Calcium: 8.6 mg/dL (ref 8.4–10.5)
Calcium: 8.7 mg/dL (ref 8.4–10.5)
Calcium: 8.7 mg/dL (ref 8.4–10.5)
Calcium: 8.8 mg/dL (ref 8.4–10.5)
Calcium: 8.8 mg/dL (ref 8.4–10.5)
Chloride: 83 mEq/L — ABNORMAL LOW (ref 96–112)
Chloride: 84 mEq/L — ABNORMAL LOW (ref 96–112)
Chloride: 85 mEq/L — ABNORMAL LOW (ref 96–112)
Chloride: 85 mEq/L — ABNORMAL LOW (ref 96–112)
Chloride: 87 mEq/L — ABNORMAL LOW (ref 96–112)
Chloride: 90 mEq/L — ABNORMAL LOW (ref 96–112)
Chloride: 91 mEq/L — ABNORMAL LOW (ref 96–112)
Chloride: 92 mEq/L — ABNORMAL LOW (ref 96–112)
Chloride: 93 mEq/L — ABNORMAL LOW (ref 96–112)
Chloride: 93 mEq/L — ABNORMAL LOW (ref 96–112)
Chloride: 94 mEq/L — ABNORMAL LOW (ref 96–112)
Chloride: 95 mEq/L — ABNORMAL LOW (ref 96–112)
Creatinine, Ser: 1.58 mg/dL — ABNORMAL HIGH (ref 0.4–1.5)
Creatinine, Ser: 1.66 mg/dL — ABNORMAL HIGH (ref 0.4–1.5)
Creatinine, Ser: 1.77 mg/dL — ABNORMAL HIGH (ref 0.4–1.5)
Creatinine, Ser: 1.8 mg/dL — ABNORMAL HIGH (ref 0.4–1.5)
Creatinine, Ser: 1.95 mg/dL — ABNORMAL HIGH (ref 0.4–1.5)
Creatinine, Ser: 2.03 mg/dL — ABNORMAL HIGH (ref 0.4–1.5)
Creatinine, Ser: 2.07 mg/dL — ABNORMAL HIGH (ref 0.4–1.5)
Creatinine, Ser: 2.1 mg/dL — ABNORMAL HIGH (ref 0.4–1.5)
Creatinine, Ser: 2.21 mg/dL — ABNORMAL HIGH (ref 0.4–1.5)
GFR calc Af Amer: 34 mL/min — ABNORMAL LOW (ref >60)
GFR calc Af Amer: 37 mL/min — ABNORMAL LOW (ref >60)
GFR calc Af Amer: 37 mL/min — ABNORMAL LOW (ref >60)
GFR calc Af Amer: 38 mL/min — ABNORMAL LOW (ref >60)
GFR calc Af Amer: 40 mL/min — ABNORMAL LOW (ref >60)
GFR calc Af Amer: 44 mL/min — ABNORMAL LOW (ref >60)
GFR calc Af Amer: 44 mL/min — ABNORMAL LOW (ref >60)
GFR calc Af Amer: 46 mL/min — ABNORMAL LOW (ref >60)
GFR calc Af Amer: 49 mL/min — ABNORMAL LOW (ref >60)
GFR calc Af Amer: 54 mL/min — ABNORMAL LOW (ref >60)
GFR calc non Af Amer: 30 mL/min — ABNORMAL LOW (ref >60)
GFR calc non Af Amer: 31 mL/min — ABNORMAL LOW (ref >60)
GFR calc non Af Amer: 31 mL/min — ABNORMAL LOW (ref >60)
GFR calc non Af Amer: 33 mL/min — ABNORMAL LOW (ref >60)
GFR calc non Af Amer: 36 mL/min — ABNORMAL LOW (ref >60)
GFR calc non Af Amer: 37 mL/min — ABNORMAL LOW (ref >60)
GFR calc non Af Amer: 37 mL/min — ABNORMAL LOW (ref >60)
GFR calc non Af Amer: 40 mL/min — ABNORMAL LOW (ref >60)
GFR calc non Af Amer: 41 mL/min — ABNORMAL LOW (ref >60)
GFR calc non Af Amer: 44 mL/min — ABNORMAL LOW (ref >60)
Glucose, Bld: 101 mg/dL — ABNORMAL HIGH (ref 70–99)
Glucose, Bld: 104 mg/dL — ABNORMAL HIGH (ref 70–99)
Glucose, Bld: 107 mg/dL — ABNORMAL HIGH (ref 70–99)
Glucose, Bld: 111 mg/dL — ABNORMAL HIGH (ref 70–99)
Glucose, Bld: 82 mg/dL (ref 70–99)
Glucose, Bld: 85 mg/dL (ref 70–99)
Glucose, Bld: 90 mg/dL (ref 70–99)
Glucose, Bld: 91 mg/dL (ref 70–99)
Glucose, Bld: 99 mg/dL (ref 70–99)
Potassium: 2.8 mEq/L — ABNORMAL LOW (ref 3.5–5.1)
Potassium: 2.9 mEq/L — ABNORMAL LOW (ref 3.5–5.1)
Potassium: 3.1 mEq/L — ABNORMAL LOW (ref 3.5–5.1)
Potassium: 3.4 mEq/L — ABNORMAL LOW (ref 3.5–5.1)
Potassium: 3.7 mEq/L (ref 3.5–5.1)
Potassium: 3.9 mEq/L (ref 3.5–5.1)
Potassium: 4.4 mEq/L (ref 3.5–5.1)
Potassium: 4.9 mEq/L (ref 3.5–5.1)
Potassium: 5.4 mEq/L — ABNORMAL HIGH (ref 3.5–5.1)
Sodium: 125 mEq/L — ABNORMAL LOW (ref 135–145)
Sodium: 127 mEq/L — ABNORMAL LOW (ref 135–145)
Sodium: 128 mEq/L — ABNORMAL LOW (ref 135–145)
Sodium: 128 mEq/L — ABNORMAL LOW (ref 135–145)
Sodium: 128 mEq/L — ABNORMAL LOW (ref 135–145)
Sodium: 129 mEq/L — ABNORMAL LOW (ref 135–145)
Sodium: 129 mEq/L — ABNORMAL LOW (ref 135–145)
Sodium: 129 mEq/L — ABNORMAL LOW (ref 135–145)
Sodium: 130 mEq/L — ABNORMAL LOW (ref 135–145)
Sodium: 130 mEq/L — ABNORMAL LOW (ref 135–145)

## 2010-08-15 LAB — COMPREHENSIVE METABOLIC PANEL
AST: 29 U/L (ref 0–37)
AST: 39 U/L — ABNORMAL HIGH (ref 0–37)
AST: 40 U/L — ABNORMAL HIGH (ref 0–37)
Albumin: 2.7 g/dL — ABNORMAL LOW (ref 3.5–5.2)
Albumin: 3.1 g/dL — ABNORMAL LOW (ref 3.5–5.2)
Alkaline Phosphatase: 70 U/L (ref 39–117)
Alkaline Phosphatase: 80 U/L (ref 39–117)
BUN: 25 mg/dL — ABNORMAL HIGH (ref 6–23)
BUN: 32 mg/dL — ABNORMAL HIGH (ref 6–23)
BUN: 34 mg/dL — ABNORMAL HIGH (ref 6–23)
CO2: 35 mEq/L — ABNORMAL HIGH (ref 19–32)
Calcium: 8.5 mg/dL (ref 8.4–10.5)
Chloride: 86 mEq/L — ABNORMAL LOW (ref 96–112)
Chloride: 86 mEq/L — ABNORMAL LOW (ref 96–112)
Chloride: 95 mEq/L — ABNORMAL LOW (ref 96–112)
Creatinine, Ser: 1.59 mg/dL — ABNORMAL HIGH (ref 0.4–1.5)
GFR calc Af Amer: 44 mL/min — ABNORMAL LOW (ref >60)
GFR calc Af Amer: 54 mL/min — ABNORMAL LOW (ref >60)
GFR calc non Af Amer: 42 mL/min — ABNORMAL LOW (ref >60)
Glucose, Bld: 98 mg/dL (ref 70–99)
Potassium: 3.7 mEq/L (ref 3.5–5.1)
Potassium: 3.8 mEq/L (ref 3.5–5.1)
Total Bilirubin: 0.8 mg/dL (ref 0.3–1.2)
Total Bilirubin: 1.1 mg/dL (ref 0.3–1.2)
Total Bilirubin: 1.1 mg/dL (ref 0.3–1.2)
Total Protein: 6 g/dL (ref 6.0–8.3)

## 2010-08-15 LAB — POCT I-STAT 4, (NA,K, GLUC, HGB,HCT)
Glucose, Bld: 79 mg/dL (ref 70–99)
Glucose, Bld: 90 mg/dL (ref 70–99)
Glucose, Bld: 94 mg/dL (ref 70–99)
HCT: 30 % — ABNORMAL LOW (ref 39.0–52.0)
HCT: 30 % — ABNORMAL LOW (ref 39.0–52.0)
HCT: 35 % — ABNORMAL LOW (ref 39.0–52.0)
Hemoglobin: 10.2 g/dL — ABNORMAL LOW (ref 13.0–17.0)
Hemoglobin: 12.2 g/dL — ABNORMAL LOW (ref 13.0–17.0)
Potassium: 2.6 mEq/L — CL (ref 3.5–5.1)
Potassium: 3.1 mEq/L — ABNORMAL LOW (ref 3.5–5.1)
Potassium: 3.5 mEq/L (ref 3.5–5.1)
Sodium: 134 mEq/L — ABNORMAL LOW (ref 135–145)
Sodium: 134 mEq/L — ABNORMAL LOW (ref 135–145)

## 2010-08-15 LAB — URINE MICROSCOPIC-ADD ON

## 2010-08-15 LAB — POCT I-STAT 3, ART BLOOD GAS (G3+)
Acid-Base Excess: 11 mmol/L — ABNORMAL HIGH (ref 0.0–2.0)
Acid-Base Excess: 2 mmol/L (ref 0.0–2.0)
Acid-Base Excess: 3 mmol/L — ABNORMAL HIGH (ref 0.0–2.0)
Acid-Base Excess: 6 mmol/L — ABNORMAL HIGH (ref 0.0–2.0)
Bicarbonate: 26.8 mEq/L — ABNORMAL HIGH (ref 20.0–24.0)
Bicarbonate: 27.9 mEq/L — ABNORMAL HIGH (ref 20.0–24.0)
Bicarbonate: 29.3 mEq/L — ABNORMAL HIGH (ref 20.0–24.0)
Bicarbonate: 32.1 mEq/L — ABNORMAL HIGH (ref 20.0–24.0)
Bicarbonate: 32.9 mEq/L — ABNORMAL HIGH (ref 20.0–24.0)
Bicarbonate: 35.3 mEq/L — ABNORMAL HIGH (ref 20.0–24.0)
O2 Saturation: 100 %
O2 Saturation: 98 %
O2 Saturation: 99 %
O2 Saturation: 99 %
Patient temperature: 35
Patient temperature: 35.6
TCO2: 29 mmol/L (ref 0–100)
TCO2: 30 mmol/L (ref 0–100)
TCO2: 37 mmol/L (ref 0–100)
pCO2 arterial: 32.7 mmHg — ABNORMAL LOW (ref 35.0–45.0)
pCO2 arterial: 34.5 mmHg — ABNORMAL LOW (ref 35.0–45.0)
pCO2 arterial: 44.8 mmHg (ref 35.0–45.0)
pCO2 arterial: 45.6 mmHg — ABNORMAL HIGH (ref 35.0–45.0)
pH, Arterial: 7.392 (ref 7.350–7.450)
pH, Arterial: 7.518 — ABNORMAL HIGH (ref 7.350–7.450)
pH, Arterial: 7.6 — ABNORMAL HIGH (ref 7.350–7.450)
pO2, Arterial: 293 mmHg — ABNORMAL HIGH (ref 80.0–100.0)
pO2, Arterial: 384 mmHg — ABNORMAL HIGH (ref 80.0–100.0)

## 2010-08-15 LAB — POCT I-STAT, CHEM 8
BUN: 24 mg/dL — ABNORMAL HIGH (ref 6–23)
Calcium, Ion: 1.02 mmol/L — ABNORMAL LOW (ref 1.12–1.32)
Chloride: 101 mEq/L (ref 96–112)
Chloride: 99 mEq/L (ref 96–112)
Creatinine, Ser: 1.7 mg/dL — ABNORMAL HIGH (ref 0.4–1.5)
Glucose, Bld: 95 mg/dL (ref 70–99)
HCT: 40 % (ref 39.0–52.0)
Hemoglobin: 12.2 g/dL — ABNORMAL LOW (ref 13.0–17.0)
Hemoglobin: 13.6 g/dL (ref 13.0–17.0)
Potassium: 4.2 mEq/L (ref 3.5–5.1)
Potassium: 4.2 mEq/L (ref 3.5–5.1)
Sodium: 133 mEq/L — ABNORMAL LOW (ref 135–145)
Sodium: 136 mEq/L (ref 135–145)
TCO2: 28 mmol/L (ref 0–100)

## 2010-08-15 LAB — GLUCOSE, CAPILLARY
Glucose-Capillary: 123 mg/dL — ABNORMAL HIGH (ref 70–99)
Glucose-Capillary: 72 mg/dL (ref 70–99)
Glucose-Capillary: 80 mg/dL (ref 70–99)

## 2010-08-15 LAB — URINALYSIS, ROUTINE W REFLEX MICROSCOPIC
Bilirubin Urine: NEGATIVE
Glucose, UA: NEGATIVE mg/dL
Protein, ur: NEGATIVE mg/dL
Urobilinogen, UA: 0.2 mg/dL (ref 0.0–1.0)
pH: 5.5 (ref 5.0–8.0)

## 2010-08-15 LAB — CULTURE, RESPIRATORY W GRAM STAIN

## 2010-08-15 LAB — PROTIME-INR
INR: 1.34 (ref 0.00–1.49)
INR: 1.36 (ref 0.00–1.49)
INR: 1.39 (ref 0.00–1.49)
INR: 1.44 (ref 0.00–1.49)
INR: 1.46 (ref 0.00–1.49)
INR: 1.47 (ref 0.00–1.49)
INR: 1.51 — ABNORMAL HIGH (ref 0.00–1.49)
INR: 1.51 — ABNORMAL HIGH (ref 0.00–1.49)
INR: 1.51 — ABNORMAL HIGH (ref 0.00–1.49)
INR: 1.6 — ABNORMAL HIGH (ref 0.00–1.49)
INR: 1.97 — ABNORMAL HIGH (ref 0.00–1.49)
INR: 2.25 — ABNORMAL HIGH (ref 0.00–1.49)
INR: 2.47 — ABNORMAL HIGH (ref 0.00–1.49)
INR: 2.58 — ABNORMAL HIGH (ref 0.00–1.49)
Prothrombin Time: 16.8 seconds — ABNORMAL HIGH (ref 11.6–15.2)
Prothrombin Time: 17 seconds — ABNORMAL HIGH (ref 11.6–15.2)
Prothrombin Time: 17.3 seconds — ABNORMAL HIGH (ref 11.6–15.2)
Prothrombin Time: 17.7 seconds — ABNORMAL HIGH (ref 11.6–15.2)
Prothrombin Time: 17.9 seconds — ABNORMAL HIGH (ref 11.6–15.2)
Prothrombin Time: 18 seconds — ABNORMAL HIGH (ref 11.6–15.2)
Prothrombin Time: 18.4 seconds — ABNORMAL HIGH (ref 11.6–15.2)
Prothrombin Time: 18.4 seconds — ABNORMAL HIGH (ref 11.6–15.2)
Prothrombin Time: 22.2 seconds — ABNORMAL HIGH (ref 11.6–15.2)
Prothrombin Time: 26.9 seconds — ABNORMAL HIGH (ref 11.6–15.2)

## 2010-08-15 LAB — SURGICAL PCR SCREEN: Staphylococcus aureus: POSITIVE — AB

## 2010-08-15 LAB — BLOOD GAS, ARTERIAL
TCO2: 31.2 mmol/L (ref 0–100)
pCO2 arterial: 41.2 mmHg (ref 35.0–45.0)
pH, Arterial: 7.475 — ABNORMAL HIGH (ref 7.350–7.450)
pO2, Arterial: 78.3 mmHg — ABNORMAL LOW (ref 80.0–100.0)

## 2010-08-15 LAB — DIFFERENTIAL
Basophils Relative: 0 % (ref 0–1)
Eosinophils Absolute: 0.3 10*3/uL (ref 0.0–0.7)
Eosinophils Relative: 2 % (ref 0–5)
Monocytes Absolute: 1.6 10*3/uL — ABNORMAL HIGH (ref 0.1–1.0)
Monocytes Relative: 15 % — ABNORMAL HIGH (ref 3–12)
Neutrophils Relative %: 79 % — ABNORMAL HIGH (ref 43–77)

## 2010-08-15 LAB — MAGNESIUM
Magnesium: 2.5 mg/dL (ref 1.5–2.5)
Magnesium: 2.7 mg/dL — ABNORMAL HIGH (ref 1.5–2.5)
Magnesium: 3 mg/dL — ABNORMAL HIGH (ref 1.5–2.5)

## 2010-08-15 LAB — ABO/RH: ABO/RH(D): A POS

## 2010-08-15 LAB — TYPE AND SCREEN
ABO/RH(D): A POS
Antibody Screen: NEGATIVE

## 2010-08-15 LAB — CREATININE, SERUM
Creatinine, Ser: 1.89 mg/dL — ABNORMAL HIGH (ref 0.4–1.5)
GFR calc non Af Amer: 34 mL/min — ABNORMAL LOW (ref >60)
GFR calc non Af Amer: 44 mL/min — ABNORMAL LOW (ref >60)

## 2010-08-15 LAB — PLATELET COUNT: Platelets: 118 10*3/uL — ABNORMAL LOW (ref 150–400)

## 2010-08-15 LAB — URINE CULTURE
Colony Count: NO GROWTH
Culture  Setup Time: 201111121815

## 2010-08-15 LAB — EXPECTORATED SPUTUM ASSESSMENT W GRAM STAIN, RFLX TO RESP C

## 2010-08-15 LAB — DIGOXIN LEVEL: Digoxin Level: 0.3 ng/mL — ABNORMAL LOW (ref 0.8–2.0)

## 2010-08-16 ENCOUNTER — Encounter (HOSPITAL_COMMUNITY): Payer: Medicare Other

## 2010-08-16 LAB — BASIC METABOLIC PANEL
CO2: 32 mEq/L (ref 19–32)
Calcium: 8.7 mg/dL (ref 8.4–10.5)
Creatinine, Ser: 1.44 mg/dL (ref 0.4–1.5)
GFR calc Af Amer: 56 mL/min — ABNORMAL LOW (ref >60)
GFR calc non Af Amer: 47 mL/min — ABNORMAL LOW (ref >60)

## 2010-08-16 LAB — PROTIME-INR
INR: 1.39 (ref 0.00–1.49)
Prothrombin Time: 17.3 seconds — ABNORMAL HIGH (ref 11.6–15.2)

## 2010-08-16 LAB — CBC
MCH: 28.1 pg (ref 26.0–34.0)
Platelets: 170 10*3/uL (ref 150–400)
RBC: 4.66 MIL/uL (ref 4.22–5.81)
WBC: 6.5 10*3/uL (ref 4.0–10.5)

## 2010-08-16 LAB — POCT I-STAT 3, ART BLOOD GAS (G3+)
Acid-Base Excess: 5 mmol/L — ABNORMAL HIGH (ref 0.0–2.0)
pO2, Arterial: 83 mmHg (ref 80.0–100.0)

## 2010-08-16 LAB — POCT I-STAT 3, VENOUS BLOOD GAS (G3P V)
TCO2: 34 mmol/L (ref 0–100)
pH, Ven: 7.385 — ABNORMAL HIGH (ref 7.250–7.300)

## 2010-08-18 ENCOUNTER — Encounter (HOSPITAL_COMMUNITY): Payer: Medicare Other

## 2010-08-21 ENCOUNTER — Encounter (HOSPITAL_COMMUNITY): Payer: Medicare Other

## 2010-08-23 ENCOUNTER — Encounter (HOSPITAL_COMMUNITY): Payer: Medicare Other

## 2010-08-24 ENCOUNTER — Ambulatory Visit (INDEPENDENT_AMBULATORY_CARE_PROVIDER_SITE_OTHER): Payer: Medicare Other | Admitting: *Deleted

## 2010-08-24 DIAGNOSIS — Z7901 Long term (current) use of anticoagulants: Secondary | ICD-10-CM

## 2010-08-24 DIAGNOSIS — I4891 Unspecified atrial fibrillation: Secondary | ICD-10-CM

## 2010-08-25 ENCOUNTER — Encounter (HOSPITAL_COMMUNITY): Payer: Medicare Other

## 2010-08-28 ENCOUNTER — Encounter (HOSPITAL_COMMUNITY): Payer: Medicare Other

## 2010-08-30 ENCOUNTER — Encounter (HOSPITAL_COMMUNITY): Payer: Medicare Other

## 2010-09-01 ENCOUNTER — Encounter (HOSPITAL_COMMUNITY): Payer: Medicare Other

## 2010-09-04 ENCOUNTER — Encounter (HOSPITAL_COMMUNITY): Payer: Medicare Other | Attending: Cardiology

## 2010-09-04 DIAGNOSIS — Z7982 Long term (current) use of aspirin: Secondary | ICD-10-CM | POA: Insufficient documentation

## 2010-09-04 DIAGNOSIS — Z87891 Personal history of nicotine dependence: Secondary | ICD-10-CM | POA: Insufficient documentation

## 2010-09-04 DIAGNOSIS — I498 Other specified cardiac arrhythmias: Secondary | ICD-10-CM | POA: Insufficient documentation

## 2010-09-04 DIAGNOSIS — Z954 Presence of other heart-valve replacement: Secondary | ICD-10-CM | POA: Insufficient documentation

## 2010-09-04 DIAGNOSIS — N189 Chronic kidney disease, unspecified: Secondary | ICD-10-CM | POA: Insufficient documentation

## 2010-09-04 DIAGNOSIS — I079 Rheumatic tricuspid valve disease, unspecified: Secondary | ICD-10-CM | POA: Insufficient documentation

## 2010-09-04 DIAGNOSIS — I4891 Unspecified atrial fibrillation: Secondary | ICD-10-CM | POA: Insufficient documentation

## 2010-09-04 DIAGNOSIS — Z7901 Long term (current) use of anticoagulants: Secondary | ICD-10-CM | POA: Insufficient documentation

## 2010-09-04 DIAGNOSIS — I129 Hypertensive chronic kidney disease with stage 1 through stage 4 chronic kidney disease, or unspecified chronic kidney disease: Secondary | ICD-10-CM | POA: Insufficient documentation

## 2010-09-04 DIAGNOSIS — I080 Rheumatic disorders of both mitral and aortic valves: Secondary | ICD-10-CM | POA: Insufficient documentation

## 2010-09-04 DIAGNOSIS — Z5189 Encounter for other specified aftercare: Secondary | ICD-10-CM | POA: Insufficient documentation

## 2010-09-04 DIAGNOSIS — I509 Heart failure, unspecified: Secondary | ICD-10-CM | POA: Insufficient documentation

## 2010-09-06 ENCOUNTER — Encounter (HOSPITAL_COMMUNITY): Payer: Medicare Other

## 2010-09-08 ENCOUNTER — Encounter (HOSPITAL_COMMUNITY): Payer: Medicare Other

## 2010-09-11 ENCOUNTER — Encounter (HOSPITAL_COMMUNITY): Payer: Medicare Other

## 2010-09-12 ENCOUNTER — Ambulatory Visit (INDEPENDENT_AMBULATORY_CARE_PROVIDER_SITE_OTHER): Payer: Medicare Other | Admitting: Cardiology

## 2010-09-12 ENCOUNTER — Encounter: Payer: Self-pay | Admitting: *Deleted

## 2010-09-12 ENCOUNTER — Encounter: Payer: Self-pay | Admitting: Cardiology

## 2010-09-12 VITALS — BP 140/70 | HR 74 | Wt 161.0 lb

## 2010-09-12 DIAGNOSIS — I4891 Unspecified atrial fibrillation: Secondary | ICD-10-CM

## 2010-09-12 DIAGNOSIS — L530 Toxic erythema: Secondary | ICD-10-CM

## 2010-09-12 DIAGNOSIS — L270 Generalized skin eruption due to drugs and medicaments taken internally: Secondary | ICD-10-CM

## 2010-09-12 DIAGNOSIS — I119 Hypertensive heart disease without heart failure: Secondary | ICD-10-CM

## 2010-09-12 DIAGNOSIS — Z953 Presence of xenogenic heart valve: Secondary | ICD-10-CM

## 2010-09-12 DIAGNOSIS — I10 Essential (primary) hypertension: Secondary | ICD-10-CM

## 2010-09-12 DIAGNOSIS — Z954 Presence of other heart-valve replacement: Secondary | ICD-10-CM

## 2010-09-12 LAB — POCT INR: INR: 2.8

## 2010-09-12 MED ORDER — HYDRALAZINE HCL 25 MG PO TABS: 25.0000 mg | ORAL_TABLET | Freq: Three times a day (TID) | ORAL | Status: DC

## 2010-09-12 MED ORDER — VALSARTAN-HYDROCHLOROTHIAZIDE 160-12.5 MG PO TABS: 1.0000 | ORAL_TABLET | Freq: Every day | ORAL | Status: DC

## 2010-09-12 NOTE — Assessment & Plan Note (Signed)
The patient remains in atrial fibrillation with a controlled ventricular response.  He is not having any peripheral edema now.  His exertional dyspnea has improved significantly.

## 2010-09-12 NOTE — Assessment & Plan Note (Signed)
For the past several months the patient has been complaining about a skin rash.  He is certain that it is due to one of his medications.  He is however on multiple medications.  We reviewed his list in detail.  The most likely responsible drug would appear to be hydralazine or Apresoline.  We will stop that at this time.

## 2010-09-12 NOTE — Assessment & Plan Note (Signed)
It has been 5 months since his aortic valve replacement for severe aortic stenosis.  His symptoms of congestive heart failure have improved significantly.  The patient is getting ready to finish up the cardiac rehabilitation program next week.

## 2010-09-12 NOTE — Assessment & Plan Note (Signed)
The patient's blood pressure continues to be labile.  When he arrived at the cardiac rehabilitation program yesterday his initial blood pressure was 192/80 after he has been in a rush because he was late.  Today his blood pressure is normal.  We are stopping his hydralazine because of suspected skin rash and in its place we will start Diovan HCT 160/12.5 once a day.  In the past He had tolerated Avalide HCT without difficulty.As restart Diovan we are stopping his potassium supplement.  The patient will remain On furosemide and spironolactone and when he returns for his next office visit we will check a basal metabolic panel.

## 2010-09-12 NOTE — Progress Notes (Signed)
History of Present Illness: This pleasant 77 year old widowed Caucasian gentleman who is a resident at [facility] returns for a scheduled followup office visit.  He has a past history of severe aortic stenosis and on 04/12/10 he underwent implantation of a pericardial tissue valve by Dr. Laneta Simmers.  He did not require coronary artery bypass graft surgery.  Prior to surgery his ejection fraction was 35% with global left ventricular hypokinesis.  Postoperatively he had a slow recovery with a lot of problems with fluid retention and electrolyte imbalance.  He gradually improved to the point that he has moved back to his own apartment at [facility].  He is walking daily for exercise he is not having any significant exertional dyspnea or chest pain and is having no peripheral edema.  Current Outpatient Prescriptions  Medication Sig Dispense Refill  . aspirin 81 MG tablet Take 81 mg by mouth daily.        . cloNIDine (CATAPRES) 0.1 MG tablet Take 0.2 mg by mouth 2 (two) times daily.        . colesevelam (WELCHOL) 625 MG tablet Take 1,875 mg by mouth 2 (two) times daily with a meal.        . dextromethorphan-guaiFENesin (MUCINEX DM) 30-600 MG per 12 hr tablet Take 1 tablet by mouth every 12 (twelve) hours.        . ferrous gluconate (FERGON) 325 MG tablet Take 325 mg by mouth daily with breakfast.        . furosemide (LASIX) 40 MG tablet Take 40 mg by mouth daily.        Marland Kitchen levothyroxine (SYNTHROID, LEVOTHROID) 25 MCG tablet Take 25 mcg by mouth daily.        . multivitamin (THERAGRAN) per tablet Take 1 tablet by mouth daily.        Marland Kitchen spironolactone (ALDACTONE) 25 MG tablet Take 25 mg by mouth daily.        Marland Kitchen terbinafine (LAMISIL) 1 % cream Apply topically 2 (two) times daily.        Marland Kitchen warfarin (COUMADIN) 5 MG tablet Take 5 mg by mouth daily.        Marland Kitchen DISCONTD: hydrALAZINE (APRESOLINE) 25 MG tablet Take 1 tablet (25 mg total) by mouth 3 (three) times daily.  90 tablet  11  . DISCONTD: potassium chloride  SA (K-DUR,KLOR-CON) 20 MEQ tablet Take 20 mEq by mouth every other day. 1/2 tab daily      . digoxin (LANOXIN) 0.125 MG tablet Take 125 mcg by mouth daily. 1/2 TAB DAILY       . valsartan-hydrochlorothiazide (DIOVAN HCT) 160-12.5 MG per tablet Take 1 tablet by mouth daily.  30 tablet  11    Allergies  Allergen Reactions  . Altace   . Lipitor (Atorvastatin Calcium)   . Zocor (Simvastatin)     Patient Active Problem List  Diagnoses  . HYPERLIPIDEMIA  . GOUT  . HYPERTENSION  . AORTIC STENOSIS, SEVERE  . Atrial fibrillation  . URI  . BENIGN PROSTATIC HYPERTROPHY  . ERECTILE DYSFUNCTION, ORGANIC  . BACK PAIN  . POLYMYALGIA RHEUMATICA  . DYSPNEA  . S/P aortic valve replacement with porcine valve  . Drug-induced skin rash    History  Smoking status  . Former Smoker  . Quit date: 06/04/1988  Smokeless tobacco  . Not on file    History  Alcohol Use  . Yes    Family History  Problem Relation Age of Onset  . Hypertension Mother   .  Heart disease Mother   . Hypertension Father     Review of Systems: Constitutional: no fever chills diaphoresis or fatigue or change in weight.  Head and neck: no hearing loss, no epistaxis, no photophobia or visual disturbance. Respiratory: No cough, shortness of breath or wheezing. Cardiovascular: No chest pain peripheral edema, palpitations. Gastrointestinal: No abdominal distention, no abdominal pain, no change in bowel habits hematochezia or melena. Genitourinary: No dysuria, no frequency, no urgency, no nocturia. Musculoskeletal:No arthralgias, no back pain, no gait disturbance or myalgias. Neurological: No dizziness, no headaches, no numbness, no seizures, no syncope, no weakness, no tremors. Hematologic: No lymphadenopathy, no easy bruising. Psychiatric: No confusion, no hallucinations, no sleep disturbance.    Physical Exam: Filed Vitals:   09/12/10 1139  BP: 140/70  Pulse: 74  Weight is 161 pounds.Pupils equal and  reactive.   Extraocular Movements are full.  There is no scleral icterus.  The mouth and pharynx are normal.  The neck is supple.  The carotids reveal no bruits.  The jugular venous pressure is normal.  The thyroid is not enlarged.  There is no lymphadenopathy.The chest is clear to percussion and auscultation. There are no rales or rhonchi. Expansion of the chest is symmetrical.  Heart reveals good opening and closing aortic valve clicks.  No gallop or rub.  There is a grade 2/6 systolic flow murmur across the aortic valve but no diastolic murmur.  The abdomen reveals a small periumbilical hernia the liver is not enlarged.  Extremities reveal 1+ pedal pulses.  There is no pitting edema.  The skin on the lower legs and feet is shiny and somewhat dusky and cool to the touch.Strength is normal and symmetrical in all extremities.  There is no lateralizing weakness.  There are no sensory deficits.He does have a macular skin rash on his trunk which is pruritic and bothersome.   Assessment / Plan: Stop hydralazine and stopped K. Dur.  Begin Diovan HCT 160/12.5 one daily.Continue Coumadin as per [facility]

## 2010-09-13 ENCOUNTER — Encounter (HOSPITAL_COMMUNITY): Payer: Medicare Other

## 2010-09-15 ENCOUNTER — Encounter (HOSPITAL_COMMUNITY): Payer: Medicare Other

## 2010-09-18 ENCOUNTER — Encounter (HOSPITAL_COMMUNITY): Payer: Medicare Other

## 2010-09-20 ENCOUNTER — Encounter (HOSPITAL_COMMUNITY): Payer: Medicare Other

## 2010-09-20 ENCOUNTER — Other Ambulatory Visit: Payer: Self-pay | Admitting: *Deleted

## 2010-09-20 DIAGNOSIS — D649 Anemia, unspecified: Secondary | ICD-10-CM

## 2010-09-20 MED ORDER — FERROUS GLUCONATE 325 MG PO TABS: 325.0000 mg | ORAL_TABLET | Freq: Every day | ORAL | Status: DC

## 2010-09-20 NOTE — Telephone Encounter (Signed)
Refilled meds per fax request.  

## 2010-09-22 ENCOUNTER — Encounter (HOSPITAL_COMMUNITY): Payer: Medicare Other

## 2010-09-25 ENCOUNTER — Encounter (HOSPITAL_COMMUNITY): Payer: Medicare Other

## 2010-10-02 ENCOUNTER — Other Ambulatory Visit: Payer: Self-pay | Admitting: *Deleted

## 2010-10-02 DIAGNOSIS — Z79899 Other long term (current) drug therapy: Secondary | ICD-10-CM

## 2010-10-03 ENCOUNTER — Ambulatory Visit (INDEPENDENT_AMBULATORY_CARE_PROVIDER_SITE_OTHER): Payer: Medicare Other | Admitting: *Deleted

## 2010-10-03 DIAGNOSIS — I4891 Unspecified atrial fibrillation: Secondary | ICD-10-CM

## 2010-10-03 LAB — POCT INR: INR: 2.2

## 2010-10-16 ENCOUNTER — Other Ambulatory Visit (INDEPENDENT_AMBULATORY_CARE_PROVIDER_SITE_OTHER): Payer: Medicare Other | Admitting: *Deleted

## 2010-10-16 ENCOUNTER — Encounter: Payer: Self-pay | Admitting: Cardiology

## 2010-10-16 ENCOUNTER — Ambulatory Visit (INDEPENDENT_AMBULATORY_CARE_PROVIDER_SITE_OTHER): Payer: Medicare Other | Admitting: Cardiology

## 2010-10-16 ENCOUNTER — Ambulatory Visit (INDEPENDENT_AMBULATORY_CARE_PROVIDER_SITE_OTHER): Payer: Medicare Other | Admitting: *Deleted

## 2010-10-16 DIAGNOSIS — Z79899 Other long term (current) drug therapy: Secondary | ICD-10-CM

## 2010-10-16 DIAGNOSIS — Z953 Presence of xenogenic heart valve: Secondary | ICD-10-CM

## 2010-10-16 DIAGNOSIS — I4891 Unspecified atrial fibrillation: Secondary | ICD-10-CM

## 2010-10-16 DIAGNOSIS — L270 Generalized skin eruption due to drugs and medicaments taken internally: Secondary | ICD-10-CM

## 2010-10-16 DIAGNOSIS — L530 Toxic erythema: Secondary | ICD-10-CM

## 2010-10-16 DIAGNOSIS — Z954 Presence of other heart-valve replacement: Secondary | ICD-10-CM

## 2010-10-16 DIAGNOSIS — E7800 Pure hypercholesterolemia, unspecified: Secondary | ICD-10-CM

## 2010-10-16 LAB — BASIC METABOLIC PANEL
Calcium: 8.8 mg/dL (ref 8.4–10.5)
Chloride: 95 mEq/L — ABNORMAL LOW (ref 96–112)
Creatinine, Ser: 2 mg/dL — ABNORMAL HIGH (ref 0.4–1.5)

## 2010-10-16 LAB — CBC WITH DIFFERENTIAL/PLATELET
Basophils Relative: 0.4 % (ref 0.0–3.0)
Eosinophils Relative: 6.3 % — ABNORMAL HIGH (ref 0.0–5.0)
Lymphocytes Relative: 12.9 % (ref 12.0–46.0)
Neutrophils Relative %: 68.2 % (ref 43.0–77.0)
RBC: 4.14 Mil/uL — ABNORMAL LOW (ref 4.22–5.81)
WBC: 5.7 10*3/uL (ref 4.5–10.5)

## 2010-10-16 NOTE — Progress Notes (Signed)
Evan Perez Date of Birth:  1922/11/11 [name] / [facility] [address]   [address] [place], [place]  [address] [phone]           Fax   [phone]  History of Present Illness: This pleasant 77 year old gentleman is seen for a scheduled followup office visit.  He had aortic valve replacement with a pericardial tissue valve on 04/12/10.  The patient has not been expressing any exertional chest pain or angina.  He's not having any symptoms of congestive heart failure such as orthopnea or paroxysmal nocturnal dyspnea or ankle edema.  He has chronic atrial fibrillation and is on Coumadin.  He has not been having any TIA symptoms.  He has a history of hypertension hypothyroidism and dyslipidemia.  He is anxious to get off some of his medicines if possible.  We are checking lab work today.  Current Outpatient Prescriptions  Medication Sig Dispense Refill  . aspirin 81 MG tablet Take 81 mg by mouth daily.        . cloNIDine (CATAPRES) 0.1 MG tablet Take 0.2 mg by mouth 2 (two) times daily.        . colesevelam (WELCHOL) 625 MG tablet Take 1,875 mg by mouth 2 (two) times daily with a meal.        . dextromethorphan-guaiFENesin (MUCINEX DM) 30-600 MG per 12 hr tablet Take 1 tablet by mouth every 12 (twelve) hours.        . furosemide (LASIX) 40 MG tablet Take 40 mg by mouth daily.        Marland Kitchen levothyroxine (SYNTHROID, LEVOTHROID) 25 MCG tablet Take 25 mcg by mouth daily.        . Multiple Vitamins-Minerals (CERTAVITE/ANTIOXIDANTS PO) Take by mouth.        . multivitamin (THERAGRAN) per tablet Take 1 tablet by mouth daily.        Marland Kitchen spironolactone (ALDACTONE) 25 MG tablet Take 25 mg by mouth daily.        Marland Kitchen terbinafine (LAMISIL) 1 % cream Apply topically 2 (two) times daily.        . valsartan-hydrochlorothiazide (DIOVAN HCT) 160-12.5 MG per tablet Take 1 tablet by mouth daily.  30 tablet  11  . warfarin (COUMADIN) 5 MG tablet Take 5 mg by mouth daily.        Marland Kitchen DISCONTD:  digoxin (LANOXIN) 0.125 MG tablet Take 125 mcg by mouth daily. 1/2 TAB DAILY       . DISCONTD: ferrous gluconate (FERGON) 325 MG tablet Take 1 tablet (325 mg total) by mouth daily with breakfast.  30 tablet  3    Allergies  Allergen Reactions  . Altace   . Lipitor (Atorvastatin Calcium)   . Zocor (Simvastatin)     Patient Active Problem List  Diagnoses  . HYPERLIPIDEMIA  . GOUT  . HYPERTENSION  . AORTIC STENOSIS, SEVERE  . Atrial fibrillation  . URI  . BENIGN PROSTATIC HYPERTROPHY  . ERECTILE DYSFUNCTION, ORGANIC  . BACK PAIN  . POLYMYALGIA RHEUMATICA  . DYSPNEA  . S/P aortic valve replacement with porcine valve  . Drug-induced skin rash    History  Smoking status  . Former Smoker  . Quit date: 06/04/1988  Smokeless tobacco  . Not on file    History  Alcohol Use  . Yes    Family History  Problem Relation Age of Onset  . Hypertension Mother   . Heart disease Mother   . Hypertension Father  Review of Systems: Constitutional: no fever chills diaphoresis or fatigue or change in weight.  Head and neck: no hearing loss, no epistaxis, no photophobia or visual disturbance. Respiratory: No cough, shortness of breath or wheezing. Cardiovascular: No chest pain peripheral edema, palpitations. Gastrointestinal: No abdominal distention, no abdominal pain, no change in bowel habits hematochezia or melena. Genitourinary: No dysuria, no frequency, no urgency, no nocturia. Musculoskeletal:No arthralgias, no back pain, no gait disturbance or myalgias. Neurological: No dizziness, no headaches, no numbness, no seizures, no syncope, no weakness, no tremors. Hematologic: No lymphadenopathy, no easy bruising. Psychiatric: No confusion, no hallucinations, no sleep disturbance.    Physical Exam: Filed Vitals:   10/16/10 1051  BP: 120/64  Pulse: 76  The general appearance reveals a well-developed well-nourished gentleman in no distress.Pupils equal and reactive.    Extraocular Movements are full.  There is no scleral icterus.  The mouth and pharynx are normal.  The neck is supple.  The carotids reveal no bruits.  The jugular venous pressure is normal.  The thyroid is not enlarged.  There is no lymphadenopathy.The chest is clear to percussion and auscultation. There are no rales or rhonchi. Expansion of the chest is symmetrical.The precordium is quiet.  The first heart sound is normal.  The second heart sound is physiologically split.  There is no murmur gallop rub or click.  There is no abnormal lift or heave. The rhythm is irregular.The abdomen is soft and nontender. Bowel sounds are normal. The liver and spleen are not enlarged. There Are no abdominal masses. There are no bruits.  Normal extremity without phlebitis or edema.  Pedal pulses are present.  He has a resolving macular papular rash on his lower extremities which has improved.Strength is normal and symmetrical in all extremities.  There is no lateralizing weakness.  There are no sensory deficits.   Assessment / Plan: Continue present medications.  Recheck at monthly intervals for protime was in 3 months for followup office visit and fasting lab work.

## 2010-10-16 NOTE — Assessment & Plan Note (Addendum)
The patient is in chronic atrial fibrillation.  He has not been experiencing any TIA symptoms.His INR today is therapeutic at 2.5.

## 2010-10-16 NOTE — Assessment & Plan Note (Signed)
The patient's a skin rash on his lower extremities has improved.  His dermatologist has encouraged him to use moisturizing lotion because of his tendency toward dry skin

## 2010-10-16 NOTE — Assessment & Plan Note (Signed)
The patient is now 6 months post aortic valve replacement with a pericardial tissue valve.  His surgery was on 04/12/10.  He is now doing well.  He's not having any chest pain or shortness of breath.  He is participating in yoga classes out of his [facility].  He is not having any peripheral edema and is not having any orthopnea

## 2010-10-16 NOTE — Progress Notes (Signed)
Quick Note:  Please report.The kidney function is not as good and so I want him toReduce his spironolactone to just every other day.His platelet count is low so he should decrease his aspirin to just every other day ______

## 2010-10-17 NOTE — Consult Note (Signed)
NEW PATIENT CONSULTATION   Evan Perez, Evan Perez  DOB:  14-Jun-1922                                        April 04, 2010  CHART #:  [id number]   REASON FOR CONSULTATION:  Severe aortic stenosis.   CLINICAL HISTORY:  I was asked by Dr. Swaziland to evaluate the patient for  consideration of aortic valve replacement.  He is an 77 year old  gentleman with a history of known aortic stenosis followed by  echocardiogram, chronic atrial fibrillation maintained on Coumadin, a  history of hypertension, who has been very active until last few months  when he has noticed progressive fatigue and exertional dyspnea as well  as increasing lower extremity edema.  He had a repeat echocardiogram  performed on March 14, 2010, which showed decreased left ventricular  systolic function with ejection fraction of 30% to 35% with diffuse  hypokinesis.  There was severe aortic stenosis with a calcified poorly  mobile aortic valve with valve area 0.77 sq. cm by VTI and a valve area  of 0.59 sq. cm by V-max.  There was mild mitral regurgitation.  Right  ventricular systolic function was mildly to moderately reduced with  moderate to severely dilated right atrium and moderate tricuspid  regurgitation.  PA peak pressure was estimated at 55 mmHg.  He  subsequently underwent cardiac catheterization on March 28, 2010,  which showed a mean aortic valve gradient of 19 and a valve area  calculated 1 sq. cm.  Cardiac index was 2.0.  Left ventricular ejection  fraction was about 35% with global left ventricular hypokinesis.  Coronary angiography showed about 20% to 30% ostial LAD stenosis.  The  left circumflex had about 30% disease in the mid vessel.  Right coronary  artery had about 30% disease in the mid vessel.  PA pressure was 69/26  with a mean of 41.  Wedge pressure was 29/31 with a mean of 28.  End-  diastolic pressure was 28.   REVIEW OF SYSTEMS:  GENERAL:  He denies any fever or chills.   He has had  no recent weight changes.  He has had recent fatigue.  EYES:  Negative.  ENT:  Negative.  ENDOCRINE:  He denies diabetes and hypothyroidism.  CARDIOVASCULAR:  He denies any chest pain or pressure.  He has had  exertional dyspnea as well as some orthopnea.  He has history of atrial  fibrillation.  He does report some peripheral edema.  RESPIRATORY:  He has had some productive cough at times.  GI:  He denies nausea or vomiting.  He has had no melena or bright red  blood per rectum.  He does have problems with constipation.  GU:  He has had frequent urination.  He denies dysuria and hematuria.  VASCULAR:  He denies claudication and phlebitis.  NEUROLOGIC:  He denies any focal weakness or numbness.  He denies  dizziness or syncope.  He has never had a TIA or a stroke.  MUSCULOSKELETAL:  He denies arthralgias and myalgias.  PSYCHIATRIC:  Negative.  HEMATOLOGIC:  Negative.   ALLERGIES:  None.   MEDICATIONS:  1. Coumadin 5 mg daily.  2. Lanoxin  0.0625 mg daily.  3. WelChol 625 mg 4 tablets daily.  4. Multivitamin daily.  5. Lasix 80 mg b.i.d.  6. MiraLax daily.  7. Tylenol p.r.n.   PAST  MEDICAL HISTORY:  Significant for hypertension.  He has a history  of chronic atrial fibrillation, maintained on Coumadin.  He has a  history of known aortic stenosis.  He is status post vasectomy in the  past and status post tonsillectomy and adenoidectomy in the past.   FAMILY HISTORY:  Positive for cardiac disease.  Mother died at age 89  with congestive heart failure.  His father died at 1 with pneumonia.  His only brother died at 69 with heart disease.   SOCIAL HISTORY:  He is a widower.  He moved into [facility] 5 weeks ago but is not in the assisted living section.  He  quit smoking about 25 years ago.  He drinks alcohol socially.   PHYSICAL EXAMINATION:  Blood pressure 150/90, pulse is 51 and regular,  respiratory rate is 18 and unlabored.  Oxygen saturation on  room air is  95%.  He is a well-developed elderly gentleman in no distress.  He is  still very sharp.  HEENT exam shows him to be normocephalic and  atraumatic.  Pupils are equal and reactive to light and accommodation.  Extraocular muscles are intact.  His oropharynx is clear.  Neck exam  shows normal carotid pulses bilaterally.  There is transmitted murmur at  both sides of his neck.  There is no adenopathy or thyromegaly.  Cardiac  exam shows a regular rate and rhythm with a grade 2/6 systolic murmur  over the aorta.  Lung exam is clear.  Abdominal exam shows active bowel  sounds.  Abdomen is soft and nontender.  There are no palpable masses or  organomegaly.  Extremity exam shows mild bilateral lower leg and ankle  edema.  Pedal pulses are palpable bilaterally.  Skin is warm and dry.  Neurologic exam shows him to be alert and oriented x3.  Motor and  sensory exams are grossly normal.   DIAGNOSTIC TESTS:  Chest x-ray from March 20, 2010, shows atelectasis  of the right base with small right pleural effusion.  Electrocardiogram  shows atrial fibrillation.  Laboratory studies on March 20, 2010,  showed a BNP of 652, normal electrolytes with BUN of 26, creatinine of  1.4, albumin 3.4.  Normal liver function profile.  White blood cell  count of 7.2, hemoglobin 13.0, hematocrit 43.2, platelet count 251,000.   IMPRESSION:  The patient has severe aortic stenosis and moderate  tricuspid regurgitation with moderate biventricular dysfunction and  pulmonary hypertension.  He has nonocclusive coronary disease.  I agree  that aortic valve replacement is indicated to prevent progression of his  symptoms and ventricular deterioration.  We will evaluate his tricuspid  valve in the operating room and will consider whether he requires  tricuspid annuloplasty at that time.  I discussed the operative  procedure of aortic valve replacement with him including use of a tissue  valve at his age.  I  discussed the pros and cons of mechanical and  tissue valves and my recommendation for a tissue valve despite the fact  he may be on Coumadin for atrial fibrillation.  We discussed the  benefits and risks of surgery including but not limited to bleeding,  blood transfusion, infection, stroke, myocardial infarction, heart block  requiring permanent pacemaker, organ dysfunction, and death.  He  understands and agrees to proceed.  I do not think there is any  indication for performing a maze procedure in this patient given his  advanced age and chronic atrial fibrillation.  We have  tentatively  scheduled this surgery for next Wednesday, April 12, 2010.   Evelene Croon, M.D.  Electronically Signed   BB/MEDQ  D:  04/04/2010  T:  04/05/2010  Job:  [id number]   cc:   Peter M. Swaziland, M.D.  Gordy Savers, MD

## 2010-10-17 NOTE — Assessment & Plan Note (Signed)
OFFICE VISIT   ARTIS, BEGGS  DOB:  1922/10/02                                        May 30, 2010  CHART #:  [id number]   HISTORY:  The patient returned to my office today for followup status  post aortic valve replacement using a 23-mm Mitroflow aortic pericardial  heart valve on April 12, 2010.  He had a somewhat slow postoperative  course in the hospital due to fluid retention and lower extremity edema  up to the waist.  This was somewhat difficult to diurese away.  He was  subsequently discharged back to [facility] and  was in a skilled nursing section until he could return to his own place  there.  He said he has been gradually improving.  He denies any chest  pain or shortness of breath.  His only complaint is that he still has  some lower extremity edema, but said this is gradually improving with  diuretics.  His appetite has been good.   PHYSICAL EXAMINATION:  Blood pressure is 140/75, pulse 77 and regular,  and respiratory rate is 16 and unlabored.  Oxygen saturation on room air  is 94%.  He looks well.  Cardiac exam shows a regular rate and rhythm  with normal pericardial valve sounds.  There is no murmur.  Lungs reveal  slight decreased breath sounds at the right base.  The chest incision is  healing well and the sternum is stable.  There is moderate bilateral  lower extremity edema to the knee.   DIAGNOSTIC TESTS:  Followup chest x-ray today shows progressive  improvement in his bilateral pleural effusions compared to previous x-  ray on April 29, 2010.  The left pleural effusion essentially  resolved and there is no atelectasis on that side.  The right pleural  effusion is much smaller and there is improved aeration.   MEDICATIONS:  1. Coumadin 5 mg daily.  2. WelChol 25 mg 4 tablets daily.  3. Multivitamin daily.  4. Lasix 80 mg daily.  5. MiraLax daily p.r.n.  6. Norvasc 5 mg b.i.d.  7. Aspirin  81 mg daily.   IMPRESSION:  Overall, the patient appears to be making a good recovery  following his surgery especially considering his age.  I encouraged him  to continue walking as much as possible and to contact cardiac rehabs  which he is interested in participating in.  I told him he could return  to driving a car at this time, but should refrain from lifting anything  heavier than 10 pounds for a total of 3 months from date of surgery.  I  would expect his lower extremity edema to gradually improve and is  likely secondary to diastolic heart failure from left ventricular  hypertrophy combined with atrial fibrillation.  His rhythm seems very  regular today, although he has been in chronic atrial fibrillation and I  suspect he is probably still in atrial fibrillation.  He will continue  to follow up with Dr. Patty Sermons and will return to see me if he develops  any problems with his incisions.   Evelene Croon, M.D.  Electronically Signed   BB/MEDQ  D:  05/30/2010  T:  05/30/2010  Job:  [id number]   cc:   Cassell Clement, M.D.  Gordy Savers, MD

## 2010-10-17 NOTE — Op Note (Signed)
NAME:  Evan Perez, Evan Perez NO.:  [id number]   MEDICAL RECORD NO.:  [id number]          PATIENT TYPE:  AMB   LOCATION:  [facility]                         FACILITY:  [facility]   PHYSICIAN:  Mark C. Vernie Ammons, M.D.  DATE OF BIRTH:  [birth year]   DATE OF PROCEDURE:  04/26/2008  DATE OF DISCHARGE:                               OPERATIVE REPORT   PREOPERATIVE DIAGNOSES:  1. Prostate nodule.  2. Elevated prostate-specific antigen.  3. Rectal stricture.   POSTOPERATIVE DIAGNOSES:  1. Prostate nodule.  2. Elevated prostate-specific antigen.  3. Rectal stricture.   PROCEDURE:  1. Dilatation of rectal stricture.  2. Transrectal ultrasound of the prostate gland.  3. Transrectal ultrasound-guided biopsy of the prostate.   SURGEON:  Mark C. Vernie Ammons, M.D.   ANESTHESIA:  General   SPECIMENS:  Needle biopsies from the prostate to pathology.   BLOOD LOSS:  Minimal.   DRAINS:  None.   COMPLICATIONS:  None.   INDICATIONS:  The patient is an 77 year old white male who has had a PSA  increase from 4.9-6.07.  He was also found to have some slight  nodularity on the right side of the prostate and I therefore recommended  transrectal ultrasound and biopsy for further evaluation of this.  However, he had a severe rectal stricture noted and I was unable to  perform transrectal ultrasound in the office comfortably.  We discussed  performing this under anesthesia and he understands the need to do this,  as well as the need to dilate the rectal stricture and has elected to  proceed.  I have discussed the risks and complications.   DESCRIPTION OF OPERATION:  After informed consent, the patient was  brought to the major OR and placed on the table, administered general  anesthesia.  He was then moved to the lateral decubitus position.  He  had been on Coumadin which had been stopped on November 17.  He received  preoperative antibiotics and underwent routine enema this morning.   I initially  performed a DRE and found a fairly tight rectal stricture  that I could get my index finger through and I could easily palpate the  entire prostate gland.  I thought he had a nodule in the midportion on  the right-hand side, but it felt slightly firmer in the apical region on  that side.   Transrectal ultrasound probe was then used to perform dilation by gently  applying pressure and advancing the probe I was able to gently pop  through his rectal stricture.  I was then able to perform transrectal  ultrasound.   Transrectal ultrasound images were obtained using a 10 MHz probe.  I  noted the prostatic length was 4.7 cm, the height 3.3 cm and the width  5.4 cm, giving him a prostate volume of 44 mL.  Scanning from base to  apex in the axial plane I noted the seminal vesicles appear symmetric  and uninvolved with disease process.  There was hypertrophy of the  transition zone seen, as well as a normal-appearing peripheral zone  throughout.  The capsule appears intact throughout.  I did note what  appeared to be some calcification at the apex over on the right-hand  side that appeared to possibly be associated with a cystic area as well.  No other abnormalities of the prostate were noted scanning sagittally or  transversely.   Using transrectal ultrasound guidance I then obtained 12 cores from  prostate in a systematic fashion.  These were all sent to pathology  separately.   I then removed the transrectal ultrasound probe and repeated his DRE and  noted significantly improved rectal stricture that allowed my finger  easy access without the presence of the stricture and no significant  bleeding was noted either.   The patient will be taken to the recovery room and discharged home later  today with follow-up in my office in 1 week.      Mark C. Vernie Ammons, M.D.  Electronically Signed     MCO/MEDQ  D:  04/26/2008  T:  04/26/2008  Job:  [id number]

## 2010-10-18 NOTE — Progress Notes (Signed)
Left message

## 2010-10-20 ENCOUNTER — Telehealth: Payer: Self-pay | Admitting: *Deleted

## 2010-10-20 NOTE — Telephone Encounter (Signed)
Message copied by Regis Bill on Fri Oct 20, 2010  9:05 AM ------      Message from: Cassell Clement      Created: Mon Oct 16, 2010  5:16 PM       Please report.The kidney function is not as good and so I want him toReduce his spironolactone to just every other day.His platelet count is low so he should decrease his aspirin to just every other day

## 2010-10-20 NOTE — Assessment & Plan Note (Signed)
[facility]                              [facility] OFFICE NOTE   Evan Perez, Grochowski KAILASH HINZE                      MRN:          [id number]  DATE:01/23/2006                            DOB:          January 09, 1923    An 77 year old gentleman seen today for a comprehensive exam.  He has  chronic atrial fibrillation, hypertension, and moderate-to-severe AS.  He is  followed by cardiology biannually.  He has hyperlipidemia.  He has done  quite well, has seen Dr. Despina Hick lately for some hip difficulties.  He is  also followed by a dermatologist.  He has had a remote tonsillectomy,  vasectomy.  Also had surgery for a fracture of the left ankle in 1998 with a  history of gout, which has been stable for some time.  Except for some right  hip discomfort, he is doing well.   FAMILY HISTORY:  Unchanged.   PHYSICAL EXAMINATION:  GENERAL:  A healthy-appearing, fit male in no acute  distress.  VITAL SIGNS:  Blood pressure 120/78.  Ventricular response well controlled.  SKIN:  Some chronic, dry, flaky lesions around the dorsum of both feet.  HEAD AND NECK:  Unremarkable.  CHEST:  Clear.  CARDIOVASCULAR:  An irregular rhythm with a controlled ventricular response.  There was grade 2-3/6 systolic murmur, loudest at the base.  ABDOMEN:  Benign.  EXTERNAL GENITALIA:  Normal.  RECTAL:  Prostate was only minimally enlarged.  Stool negative.  EXTREMITIES:  Negative with intact peripheral pulses.   IMPRESSION:  Chronic atrial fibrillation, hypertension, hyperlipidemia, mild  benign prostatic hypertrophy.   DISPOSITION:  Medical regimen unchanged.  Laboratory update will be  reviewed.  Recheck here in 4-6 months.                                   Gordy Savers, MD   PFK/MedQ  DD:  01/23/2006  DT:  01/23/2006  Job #:  [id number]

## 2010-10-20 NOTE — Progress Notes (Signed)
Advised patient

## 2010-10-20 NOTE — Telephone Encounter (Signed)
Advised of labs and medication changes, verbalized understanding

## 2010-11-03 ENCOUNTER — Encounter: Payer: Medicare Other | Admitting: *Deleted

## 2010-11-13 ENCOUNTER — Ambulatory Visit (INDEPENDENT_AMBULATORY_CARE_PROVIDER_SITE_OTHER): Payer: Medicare Other | Admitting: *Deleted

## 2010-11-13 DIAGNOSIS — I4891 Unspecified atrial fibrillation: Secondary | ICD-10-CM

## 2010-11-13 LAB — POCT INR: INR: 1.8

## 2010-11-28 ENCOUNTER — Ambulatory Visit (INDEPENDENT_AMBULATORY_CARE_PROVIDER_SITE_OTHER): Payer: Medicare Other | Admitting: *Deleted

## 2010-11-28 DIAGNOSIS — I4891 Unspecified atrial fibrillation: Secondary | ICD-10-CM

## 2010-12-26 ENCOUNTER — Encounter: Payer: Medicare Other | Admitting: *Deleted

## 2010-12-27 ENCOUNTER — Ambulatory Visit (INDEPENDENT_AMBULATORY_CARE_PROVIDER_SITE_OTHER): Payer: Medicare Other | Admitting: Family Medicine

## 2010-12-27 ENCOUNTER — Encounter: Payer: Self-pay | Admitting: Family Medicine

## 2010-12-27 ENCOUNTER — Ambulatory Visit (INDEPENDENT_AMBULATORY_CARE_PROVIDER_SITE_OTHER): Payer: Medicare Other | Admitting: *Deleted

## 2010-12-27 VITALS — BP 124/60 | HR 82 | Temp 98.0°F | Wt 168.0 lb

## 2010-12-27 DIAGNOSIS — I4891 Unspecified atrial fibrillation: Secondary | ICD-10-CM

## 2010-12-27 DIAGNOSIS — Z9109 Other allergy status, other than to drugs and biological substances: Secondary | ICD-10-CM

## 2010-12-27 DIAGNOSIS — I10 Essential (primary) hypertension: Secondary | ICD-10-CM

## 2010-12-27 DIAGNOSIS — J309 Allergic rhinitis, unspecified: Secondary | ICD-10-CM

## 2010-12-27 LAB — POCT INR: INR: 2.5

## 2010-12-27 NOTE — Progress Notes (Signed)
  Subjective:    Patient ID: Evan Perez, male    DOB: December 18, 1922, 77 y.o.   MRN: [id number]  HPI Here for chest congestion which sounds to be chronic. He often has some PND and may have a cough which is productive of clear sputum. No fever, no leg swelling, no SOB. He takes Mucinex every day.    Review of Systems  Constitutional: Negative.   HENT: Positive for rhinorrhea and postnasal drip. Negative for congestion and sinus pressure.   Eyes: Negative.   Respiratory: Positive for cough. Negative for shortness of breath and wheezing.   Cardiovascular: Negative.        Objective:   Physical Exam  Constitutional: He appears well-developed and well-nourished.  HENT:  Right Ear: External ear normal.  Left Ear: External ear normal.  Nose: Nose normal.  Mouth/Throat: Oropharynx is clear and moist. No oropharyngeal exudate.  Eyes: Conjunctivae are normal. Pupils are equal, round, and reactive to light.  Neck: Normal range of motion. Neck supple. No thyromegaly present.  Cardiovascular: Normal rate, normal heart sounds and intact distal pulses.  Exam reveals no gallop and no friction rub.   No murmur heard.      Irregularly irregular  Pulmonary/Chest: Effort normal and breath sounds normal. No respiratory distress. He has no wheezes. He has no rales. He exhibits no tenderness.  Lymphadenopathy:    He has no cervical adenopathy.          Assessment & Plan:  He has some allergies, so I suggested he try Zyrtec once a day.

## 2010-12-29 ENCOUNTER — Emergency Department (HOSPITAL_COMMUNITY)
Admission: EM | Admit: 2010-12-29 | Discharge: 2010-12-29 | Disposition: A | Discharge status: Home / Self Care | Payer: Medicare Other | Attending: Emergency Medicine | Admitting: Emergency Medicine

## 2010-12-29 DIAGNOSIS — E039 Hypothyroidism, unspecified: Secondary | ICD-10-CM | POA: Insufficient documentation

## 2010-12-29 DIAGNOSIS — S01502A Unspecified open wound of oral cavity, initial encounter: Secondary | ICD-10-CM | POA: Insufficient documentation

## 2010-12-29 DIAGNOSIS — Z7901 Long term (current) use of anticoagulants: Secondary | ICD-10-CM | POA: Insufficient documentation

## 2010-12-29 DIAGNOSIS — X58XXXA Exposure to other specified factors, initial encounter: Secondary | ICD-10-CM | POA: Insufficient documentation

## 2010-12-29 DIAGNOSIS — I10 Essential (primary) hypertension: Secondary | ICD-10-CM | POA: Insufficient documentation

## 2010-12-29 DIAGNOSIS — I4891 Unspecified atrial fibrillation: Secondary | ICD-10-CM | POA: Insufficient documentation

## 2010-12-29 LAB — PROTIME-INR: Prothrombin Time: 24.5 seconds — ABNORMAL HIGH (ref 11.6–15.2)

## 2010-12-29 LAB — HEMOGLOBIN AND HEMATOCRIT, BLOOD
HCT: 35.6 % — ABNORMAL LOW (ref 39.0–52.0)
Hemoglobin: 11.5 g/dL — ABNORMAL LOW (ref 13.0–17.0)

## 2011-01-12 ENCOUNTER — Ambulatory Visit (INDEPENDENT_AMBULATORY_CARE_PROVIDER_SITE_OTHER): Payer: Medicare Other | Admitting: Cardiology

## 2011-01-12 ENCOUNTER — Encounter: Payer: Self-pay | Admitting: Cardiology

## 2011-01-12 ENCOUNTER — Encounter (INDEPENDENT_AMBULATORY_CARE_PROVIDER_SITE_OTHER): Payer: Medicare Other | Admitting: *Deleted

## 2011-01-12 ENCOUNTER — Other Ambulatory Visit (INDEPENDENT_AMBULATORY_CARE_PROVIDER_SITE_OTHER): Payer: Medicare Other | Admitting: *Deleted

## 2011-01-12 ENCOUNTER — Ambulatory Visit
Admission: RE | Admit: 2011-01-12 | Discharge: 2011-01-12 | Disposition: A | Discharge status: Home / Self Care | Payer: Medicare Other | Source: Ambulatory Visit | Attending: Cardiology | Admitting: Cardiology

## 2011-01-12 VITALS — BP 130/68 | HR 68 | Wt 166.0 lb

## 2011-01-12 DIAGNOSIS — E7800 Pure hypercholesterolemia, unspecified: Secondary | ICD-10-CM

## 2011-01-12 DIAGNOSIS — Z79899 Other long term (current) drug therapy: Secondary | ICD-10-CM

## 2011-01-12 DIAGNOSIS — Z954 Presence of other heart-valve replacement: Secondary | ICD-10-CM

## 2011-01-12 DIAGNOSIS — I4891 Unspecified atrial fibrillation: Secondary | ICD-10-CM

## 2011-01-12 DIAGNOSIS — R05 Cough: Secondary | ICD-10-CM

## 2011-01-12 DIAGNOSIS — I119 Hypertensive heart disease without heart failure: Secondary | ICD-10-CM

## 2011-01-12 DIAGNOSIS — Z953 Presence of xenogenic heart valve: Secondary | ICD-10-CM

## 2011-01-12 DIAGNOSIS — R0600 Dyspnea, unspecified: Secondary | ICD-10-CM

## 2011-01-12 DIAGNOSIS — I509 Heart failure, unspecified: Secondary | ICD-10-CM

## 2011-01-12 DIAGNOSIS — R0602 Shortness of breath: Secondary | ICD-10-CM

## 2011-01-12 DIAGNOSIS — R0989 Other specified symptoms and signs involving the circulatory and respiratory systems: Secondary | ICD-10-CM

## 2011-01-12 LAB — BASIC METABOLIC PANEL
BUN: 42 mg/dL — ABNORMAL HIGH (ref 6–23)
Calcium: 9.1 mg/dL (ref 8.4–10.5)
Creatinine, Ser: 2.1 mg/dL — ABNORMAL HIGH (ref 0.4–1.5)
GFR: 32.33 mL/min — ABNORMAL LOW (ref >60.00)
Glucose, Bld: 85 mg/dL (ref 70–99)

## 2011-01-12 LAB — HEPATIC FUNCTION PANEL
ALT: 23 U/L (ref 0–53)
AST: 25 U/L (ref 0–37)
Alkaline Phosphatase: 65 U/L (ref 39–117)
Bilirubin, Direct: 0.1 mg/dL (ref 0.0–0.3)
Total Protein: 7.8 g/dL (ref 6.0–8.3)

## 2011-01-12 LAB — LIPID PANEL
Cholesterol: 190 mg/dL (ref 0–200)
VLDL: 12.6 mg/dL (ref 0.0–40.0)

## 2011-01-12 MED ORDER — FUROSEMIDE 40 MG PO TABS: ORAL_TABLET | ORAL | Status: DC

## 2011-01-12 NOTE — Assessment & Plan Note (Signed)
The patient is having very little dyspnea but does have a chronic persistent cough.  We're getting a chest x-ray on him today.

## 2011-01-12 NOTE — Assessment & Plan Note (Signed)
The patient continues to do well status post aortic valve replacement and has not had any symptoms of congestive heart failure.

## 2011-01-12 NOTE — Progress Notes (Signed)
Evan Perez Date of Birth:  1923/05/01 [facility] / [facility] [address]   [address] [place], [place]  [address] [phone]           Fax   [phone]  History of Present Illness: This pleasant 77 year old gentleman is seen for a scheduled followup office visit.  He has a history of chronic atrial fibrillation and is on Coumadin.  He had a past history of severe aortic stenosis and on 04/12/10 he underwent aortic valve replacement with a pericardial tissue valve by Dr. Laneta Simmers.  The early postoperative period was marked with severe fluid retention but his cardiac status has steadily improved and he is now doing well with no evidence of clinical congestive heart failure or fluid overload.  He is not having any chest pain or angina.  He has had a chronic cough which she attributes to postnasal drainage.  He has been taking Mucinex and has also tried Zyrtec which did not agree with him because it made him too drowsy.  He is a former smoker having quit at age 88.  His not having any gastrointestinal or genitourinary issues and his appetite is good and he has gained 6 pounds since last visit.  [facility] lives at [facility].  Current Outpatient Prescriptions  Medication Sig Dispense Refill  . aspirin 81 MG tablet Take 81 mg by mouth daily. Take 3 x week      . cloNIDine (CATAPRES) 0.1 MG tablet Take 0.2 mg by mouth 2 (two) times daily.        . colesevelam (WELCHOL) 625 MG tablet Take 1,875 mg by mouth 2 (two) times daily with a meal.        . dextromethorphan-guaiFENesin (MUCINEX DM) 30-600 MG per 12 hr tablet Take 1 tablet by mouth every 12 (twelve) hours.        . furosemide (LASIX) 40 MG tablet Take one half tab daily  30 tablet  11  . levothyroxine (SYNTHROID, LEVOTHROID) 25 MCG tablet Take 25 mcg by mouth daily.        . Multiple Vitamins-Minerals (CERTAVITE/ANTIOXIDANTS PO) Take by mouth.        . multivitamin (THERAGRAN) per tablet Take 1 tablet by mouth  daily.        Marland Kitchen spironolactone (ALDACTONE) 25 MG tablet Take 25 mg by mouth daily. 3 x week      . valsartan-hydrochlorothiazide (DIOVAN HCT) 160-12.5 MG per tablet Take 1 tablet by mouth daily.  30 tablet  11  . warfarin (COUMADIN) 5 MG tablet Take 5 mg by mouth daily. As directed        Allergies  Allergen Reactions  . Altace   . Lipitor (Atorvastatin Calcium)   . Zocor (Simvastatin)     Patient Active Problem List  Diagnoses  . HYPERLIPIDEMIA  . GOUT  . HYPERTENSION  . AORTIC STENOSIS, SEVERE  . Atrial fibrillation  . URI  . BENIGN PROSTATIC HYPERTROPHY  . ERECTILE DYSFUNCTION, ORGANIC  . BACK PAIN  . POLYMYALGIA RHEUMATICA  . DYSPNEA  . S/P aortic valve replacement with porcine valve  . Drug-induced skin rash    History  Smoking status  . Former Smoker  . Quit date: 06/04/1988  Smokeless tobacco  . Not on file    History  Alcohol Use  . Yes    Family History  Problem Relation Age of Onset  . Hypertension Mother   . Heart disease Mother   . Hypertension Father  Review of Systems: Constitutional: no fever chills diaphoresis or fatigue or change in weight.  Head and neck: no hearing loss, no epistaxis, no photophobia or visual disturbance. Respiratory: No cough, shortness of breath or wheezing. Cardiovascular: No chest pain peripheral edema, palpitations. Gastrointestinal: No abdominal distention, no abdominal pain, no change in bowel habits hematochezia or melena. Genitourinary: No dysuria, no frequency, no urgency, no nocturia. Musculoskeletal:No arthralgias, no back pain, no gait disturbance or myalgias. Neurological: No dizziness, no headaches, no numbness, no seizures, no syncope, no weakness, no tremors. Hematologic: No lymphadenopathy, no easy bruising. Psychiatric: No confusion, no hallucinations, no sleep disturbance.    Physical Exam: Filed Vitals:   01/12/11 0905  BP: 130/68  Pulse: 68   The general appearance reveals a  well-developed well-nourished elderly gentleman in no distress.Pupils equal and reactive.   Extraocular Movements are full.  There is no scleral icterus.  The mouth and pharynx are normal.  The neck is supple.  The carotids reveal no bruits.  The jugular venous pressure is normal.  The thyroid is not enlarged.  There is no lymphadenopathy.  The chest is clear to percussion and auscultation. There are no rales or rhonchi. Expansion of the chest is symmetrical.    Heart reveals a soft systolic ejection murmur across the prosthetic aortic valve.  No diastolic murmur.  No gallop or rub.The abdomen is soft and nontender. Bowel sounds are normal. The liver and spleen are not enlarged. There Are no abdominal masses. There are no bruits.  The pedal pulses are good.  There is no phlebitis or edema.  There is no cyanosis or clubbing.  Strength is normal and symmetrical in all extremities.  There is no lateralizing weakness.  There are no sensory deficits.  The skin is warm and dry.  There is no rash.    Assessment / Plan: We obtained a chest x-ray at [facility] imaging and this came back okay.  It showed stable cardiomegaly but no evidence of any effusion or infiltrate or active pulmonary disease.  The patient is to continue his same medication.  We are decreasing his furosemide to just half a tablet a day.  He does not like to furosemide because of the urinary urgency which it creates.  Blood work today is pending

## 2011-01-12 NOTE — Assessment & Plan Note (Signed)
The patient remains on Coumadin for his long-standing atrial fibrillation.  He's had no thromboembolic episodes.  He's not having any evidence of bleeding from the anticoagulation.

## 2011-01-15 ENCOUNTER — Telehealth: Payer: Self-pay | Admitting: *Deleted

## 2011-01-15 NOTE — Telephone Encounter (Signed)
Advised patient of labs and CXR results

## 2011-01-23 LAB — PROTIME-INR: INR: 2.2 — AB (ref <=1.1)

## 2011-01-24 ENCOUNTER — Ambulatory Visit (INDEPENDENT_AMBULATORY_CARE_PROVIDER_SITE_OTHER): Payer: Self-pay | Admitting: *Deleted

## 2011-01-24 DIAGNOSIS — R0989 Other specified symptoms and signs involving the circulatory and respiratory systems: Secondary | ICD-10-CM

## 2011-01-24 NOTE — Patient Instructions (Signed)
Advised patient and faxed back

## 2011-02-20 LAB — PROTIME-INR: INR: 1.9 — AB (ref 0.9–1.1)

## 2011-02-21 ENCOUNTER — Other Ambulatory Visit: Payer: Self-pay | Admitting: *Deleted

## 2011-02-21 DIAGNOSIS — D649 Anemia, unspecified: Secondary | ICD-10-CM

## 2011-02-21 MED ORDER — FERROUS GLUCONATE 325 MG PO TABS: 325.0000 mg | ORAL_TABLET | Freq: Every day | ORAL | Status: DC

## 2011-02-21 NOTE — Telephone Encounter (Signed)
Spoke with patient and he has been on his iron daily.  Added labs to appointment in November to check cbc.  Faxed signed Rx back

## 2011-02-22 ENCOUNTER — Ambulatory Visit (INDEPENDENT_AMBULATORY_CARE_PROVIDER_SITE_OTHER): Payer: Self-pay | Admitting: Cardiology

## 2011-02-22 DIAGNOSIS — R0989 Other specified symptoms and signs involving the circulatory and respiratory systems: Secondary | ICD-10-CM

## 2011-03-07 LAB — PROTIME-INR
INR: 1.1
Prothrombin Time: 14.2

## 2011-03-07 LAB — BASIC METABOLIC PANEL
BUN: 25 — ABNORMAL HIGH
CO2: 32
Calcium: 9.1
Chloride: 96
Creatinine, Ser: 1.33
GFR calc Af Amer: >60
GFR calc non Af Amer: 51 — ABNORMAL LOW
Glucose, Bld: 85
Potassium: 3.9
Sodium: 135

## 2011-03-07 LAB — POCT HEMOGLOBIN-HEMACUE: Hemoglobin: 15.4

## 2011-03-13 ENCOUNTER — Encounter: Payer: Self-pay | Admitting: Internal Medicine

## 2011-03-13 ENCOUNTER — Ambulatory Visit (INDEPENDENT_AMBULATORY_CARE_PROVIDER_SITE_OTHER): Payer: Medicare Other | Admitting: Internal Medicine

## 2011-03-13 VITALS — BP 122/70 | Temp 97.7°F | Wt 171.0 lb

## 2011-03-13 DIAGNOSIS — I4891 Unspecified atrial fibrillation: Secondary | ICD-10-CM

## 2011-03-13 DIAGNOSIS — J309 Allergic rhinitis, unspecified: Secondary | ICD-10-CM

## 2011-03-13 DIAGNOSIS — Z0000 Encounter for general adult medical examination without abnormal findings: Secondary | ICD-10-CM

## 2011-03-13 DIAGNOSIS — I10 Essential (primary) hypertension: Secondary | ICD-10-CM

## 2011-03-13 DIAGNOSIS — J3089 Other allergic rhinitis: Secondary | ICD-10-CM

## 2011-03-13 DIAGNOSIS — E785 Hyperlipidemia, unspecified: Secondary | ICD-10-CM

## 2011-03-13 DIAGNOSIS — Z23 Encounter for immunization: Secondary | ICD-10-CM

## 2011-03-13 DIAGNOSIS — Z953 Presence of xenogenic heart valve: Secondary | ICD-10-CM

## 2011-03-13 DIAGNOSIS — Z954 Presence of other heart-valve replacement: Secondary | ICD-10-CM

## 2011-03-13 NOTE — Patient Instructions (Signed)
Limit your sodium (Salt) intake    It is important that you exercise regularly, at least 20 minutes 3 to 4 times per week.  If you develop chest pain or shortness of breath seek  medical attention.  Return in 6 months for follow-up  Cardiology follow-up as scheduled 

## 2011-03-13 NOTE — Progress Notes (Signed)
  Subjective:    Patient ID: Evan Perez, male    DOB: May 12, 1923, 77 y.o.   MRN: [id number]  HPI  77 year old patient who has not been seen here in over one year. He is followed by cardiology for permanent atrial for ablation and remains on Coumadin anticoagulation. Late last year he underwent a porcine aortic valve repair for severe aortic stenosis. He has done remarkably well. He has dyslipidemia and hypertension. He also has remote history of polymyalgia rheumatica. His chief concern is a blister involving his left anterior lower leg. He also complains of chronic nasal congestion and wishes to pursue an allergy evaluation. This has been refractory to oral antihistamines and apparently nasal steroids. He also complains of a rash involving his upper back area bilaterally.    Review of Systems  Constitutional: Negative for fever, chills, appetite change and fatigue.  HENT: Positive for congestion. Negative for hearing loss, ear pain, sore throat, trouble swallowing, neck stiffness, dental problem, voice change and tinnitus.   Eyes: Negative for pain, discharge and visual disturbance.  Respiratory: Negative for cough, chest tightness, wheezing and stridor.   Cardiovascular: Negative for chest pain, palpitations and leg swelling.  Gastrointestinal: Negative for nausea, vomiting, abdominal pain, diarrhea, constipation, blood in stool and abdominal distention.  Genitourinary: Negative for urgency, hematuria, flank pain, discharge, difficulty urinating and genital sores.  Musculoskeletal: Negative for myalgias, back pain, joint swelling, arthralgias and gait problem.  Skin: Positive for rash and wound.  Neurological: Negative for dizziness, syncope, speech difficulty, weakness, numbness and headaches.  Hematological: Negative for adenopathy. Does not bruise/bleed easily.  Psychiatric/Behavioral: Negative for behavioral problems and dysphoric mood. The patient is not nervous/anxious.          Objective:   Physical Exam  Constitutional: He is oriented to person, place, and time. He appears well-developed.       Blood pressure 120/70  HENT:  Head: Normocephalic.  Right Ear: External ear normal.  Left Ear: External ear normal.  Eyes: Conjunctivae and EOM are normal.  Neck: Normal range of motion.  Cardiovascular: Normal rate and normal heart sounds.   Pulmonary/Chest: Breath sounds normal.  Abdominal: Bowel sounds are normal.  Musculoskeletal: Normal range of motion. He exhibits no edema and no tenderness.  Neurological: He is alert and oriented to person, place, and time.  Skin:       A bulla approximately 4 cm in widest diameter was noted involving his left anterior lower leg  Dry skin was noted over the upper back area with scattered excoriations  Psychiatric: He has a normal mood and affect. His behavior is normal.          Assessment & Plan:   Status post aortic valve repair with porcine bioprosthesis Permanent atrial fibrillation on Coumadin anticoagulation Bulla left anterior lower leg- as to the flap with the alcohol the blister was decompressed and a buttock ointment applied and the lesion wrapped in a sterile dressing. Local wound care discussed Hypertension well controlled Chronic Coumadin anticoagulation

## 2011-03-14 ENCOUNTER — Ambulatory Visit: Payer: Medicare Other | Admitting: Internal Medicine

## 2011-03-14 ENCOUNTER — Ambulatory Visit (INDEPENDENT_AMBULATORY_CARE_PROVIDER_SITE_OTHER): Payer: Self-pay | Admitting: Cardiology

## 2011-03-14 DIAGNOSIS — R0989 Other specified symptoms and signs involving the circulatory and respiratory systems: Secondary | ICD-10-CM

## 2011-04-10 ENCOUNTER — Encounter: Payer: Self-pay | Admitting: Cardiology

## 2011-04-10 ENCOUNTER — Ambulatory Visit (INDEPENDENT_AMBULATORY_CARE_PROVIDER_SITE_OTHER): Payer: Medicare Other | Admitting: Cardiology

## 2011-04-10 ENCOUNTER — Other Ambulatory Visit (INDEPENDENT_AMBULATORY_CARE_PROVIDER_SITE_OTHER): Payer: Medicare Other | Admitting: *Deleted

## 2011-04-10 ENCOUNTER — Ambulatory Visit (INDEPENDENT_AMBULATORY_CARE_PROVIDER_SITE_OTHER): Payer: Medicare Other | Admitting: *Deleted

## 2011-04-10 VITALS — BP 136/78 | HR 54 | Ht 70.0 in | Wt 170.5 lb

## 2011-04-10 DIAGNOSIS — I119 Hypertensive heart disease without heart failure: Secondary | ICD-10-CM

## 2011-04-10 DIAGNOSIS — I4891 Unspecified atrial fibrillation: Secondary | ICD-10-CM

## 2011-04-10 DIAGNOSIS — R0602 Shortness of breath: Secondary | ICD-10-CM

## 2011-04-10 DIAGNOSIS — Z953 Presence of xenogenic heart valve: Secondary | ICD-10-CM

## 2011-04-10 DIAGNOSIS — I359 Nonrheumatic aortic valve disorder, unspecified: Secondary | ICD-10-CM

## 2011-04-10 LAB — HEPATIC FUNCTION PANEL
Alkaline Phosphatase: 59 U/L (ref 39–117)
Bilirubin, Direct: 0.1 mg/dL (ref 0.0–0.3)
Total Bilirubin: 0.7 mg/dL (ref 0.3–1.2)
Total Protein: 7.2 g/dL (ref 6.0–8.3)

## 2011-04-10 LAB — LIPID PANEL
Cholesterol: 184 mg/dL (ref 0–200)
LDL Cholesterol: 119 mg/dL — ABNORMAL HIGH (ref 0–99)

## 2011-04-10 LAB — BASIC METABOLIC PANEL
BUN: 45 mg/dL — ABNORMAL HIGH (ref 6–23)
CO2: 28 mEq/L (ref 19–32)
Chloride: 98 mEq/L (ref 96–112)
Creatinine, Ser: 2.2 mg/dL — ABNORMAL HIGH (ref 0.4–1.5)
Glucose, Bld: 85 mg/dL (ref 70–99)

## 2011-04-10 NOTE — Patient Instructions (Signed)
Stop your Lasix and call if you rapid weight increase or swelling Your physician recommends that you schedule a follow-up appointment in: 4 months with fasting labs.  Ok to see Lawson Fiscal NP or  Dr. Patty Sermons

## 2011-04-10 NOTE — Assessment & Plan Note (Signed)
The patient has not been expressing any recent exertional dyspnea.  He does not think that he needs the Lasix any more.  He has been taking just a half a tablet a day.  We will try leaving off the furosemide that if he starts to have fluid retention or rapid weight gain or edema he will have to go back on the furosemide.

## 2011-04-10 NOTE — Progress Notes (Signed)
Evan Perez Date of Birth:  01-27-1923 [facility] / [facility] [address]   [address] [place], [place]  [address] [phone]           Fax   [phone]  History of Present Illness: This pleasant 22 gentleman is seen for four-month followup office visit.  He has a history of previous severe aortic stenosis.  On 04/12/10 he underwent aortic valve replacement with a pericardial tissue valve by Dr. Laneta Simmers.  Initially postoperatively he had a lot of problems with congestive heart failure which has gradually resolved.  He is having no fluid retention at the present time.  His most recent electrolytes show his kidneys actually to be somewhat dry and we are cutting back on his diuretics today.  Is not expressing any chest pain or shortness of breath.  He does have a lot of problems with allergies and actually has an appointment to see an allergist tomorrow  Current Outpatient Prescriptions  Medication Sig Dispense Refill  . aspirin 81 MG tablet Take 81 mg by mouth daily. Take 3 x week      . cloNIDine (CATAPRES) 0.1 MG tablet Take 0.2 mg by mouth 2 (two) times daily.        . colesevelam (WELCHOL) 625 MG tablet Take 1,875 mg by mouth 2 (two) times daily with a meal.        . dextromethorphan-guaiFENesin (MUCINEX DM) 30-600 MG per 12 hr tablet Take 1 tablet by mouth every 12 (twelve) hours.        . ferrous gluconate (FERGON) 325 MG tablet Take 1 tablet (325 mg total) by mouth daily with breakfast.  30 tablet  3  . levothyroxine (SYNTHROID, LEVOTHROID) 25 MCG tablet Take 25 mcg by mouth daily.        . multivitamin (THERAGRAN) per tablet Take 1 tablet by mouth daily.        Marland Kitchen spironolactone (ALDACTONE) 25 MG tablet Take 25 mg by mouth daily. 3 x week      . valsartan-hydrochlorothiazide (DIOVAN HCT) 160-12.5 MG per tablet Take 1 tablet by mouth daily.  30 tablet  11  . warfarin (COUMADIN) 5 MG tablet Take 5 mg by mouth daily. As directed        Allergies  Allergen Reactions   . Altace   . Lipitor (Atorvastatin Calcium)   . Zocor (Simvastatin)     Patient Active Problem List  Diagnoses  . HYPERLIPIDEMIA  . GOUT  . HYPERTENSION  . AORTIC STENOSIS, SEVERE  . Atrial fibrillation  . URI  . BENIGN PROSTATIC HYPERTROPHY  . ERECTILE DYSFUNCTION, ORGANIC  . BACK PAIN  . POLYMYALGIA RHEUMATICA  . DYSPNEA  . S/P aortic valve replacement with porcine valve  . Drug-induced skin rash  . Perennial allergic rhinitis    History  Smoking status  . Former Smoker  . Quit date: 06/04/1988  Smokeless tobacco  . Never Used    History  Alcohol Use  . Yes    Family History  Problem Relation Age of Onset  . Hypertension Mother   . Heart disease Mother   . Hypertension Father     Review of Systems: Constitutional: no fever chills diaphoresis or fatigue or change in weight.  Head and neck: no hearing loss, no epistaxis, no photophobia or visual disturbance. Respiratory: No cough, shortness of breath or wheezing. Cardiovascular: No chest pain peripheral edema, palpitations. Gastrointestinal: No abdominal distention, no abdominal pain, no change in bowel habits hematochezia or melena.  Genitourinary: No dysuria, no frequency, no urgency, no nocturia. Musculoskeletal:No arthralgias, no back pain, no gait disturbance or myalgias. Neurological: No dizziness, no headaches, no numbness, no seizures, no syncope, no weakness, no tremors. Hematologic: No lymphadenopathy, no easy bruising. Psychiatric: No confusion, no hallucinations, no sleep disturbance.    Physical Exam: Filed Vitals:   04/10/11 1408  BP: 136/78  Pulse: 54   the general appearance reveals a well-developed well-nourished gentleman in no distress.  He appears to be younger than his stated age.Pupils equal and reactive.   Extraocular Movements are full.  There is no scleral icterus.  The mouth and pharynx are normal.  The neck is supple.  The carotids reveal no bruits.  The jugular venous pressure  is normal.  The thyroid is not enlarged.  There is no lymphadenopathy.  The chest is clear to percussion and auscultation. There are no rales or rhonchi. Expansion of the chest is symmetrical.  The heart reveals a good valve click.  There is no aortic insufficiency.  There is a soft systolic murmur across the prosthetic valve.  No gallop or rub.  Rhythm is irregular.  The abdomen is soft and nontender without hepatosplenomegaly or masses to extremities show no phlebitis or edema.  Pedal pulses are present.  There is no phlebitis.The skin is warm and dry.  There is no rash.  EKG today shows atrial fibrillation with a controlled ventricular response and no ischemic changes.   Assessment / Plan: Continue same medication except stop furosemide.  Recheck in 4 months for a followup office visit

## 2011-04-10 NOTE — Assessment & Plan Note (Signed)
The patient has a known history of atrial fibrillation.  He's been on long-term Coumadin.  He's had no thromboembolic episodes.

## 2011-04-11 ENCOUNTER — Encounter: Payer: Medicare Other | Admitting: *Deleted

## 2011-04-16 ENCOUNTER — Telehealth: Payer: Self-pay | Admitting: *Deleted

## 2011-04-16 ENCOUNTER — Telehealth: Payer: Self-pay | Admitting: Cardiology

## 2011-04-16 NOTE — Telephone Encounter (Signed)
Advised should be on plain Mucinex and changed on med list

## 2011-04-16 NOTE — Telephone Encounter (Signed)
Pt was calling about meds he is taking. He was wanting to know about mucinex. Please call

## 2011-04-16 NOTE — Telephone Encounter (Signed)
Message copied by Burnell Blanks on Mon Apr 16, 2011 11:59 AM ------      Message from: Cassell Clement      Created: Tue Apr 10, 2011  4:46 PM       Please report.  The kidneys are to try.  This should improve now that we have stopped his Lasix.  The liver tests are normal

## 2011-04-16 NOTE — Telephone Encounter (Signed)
Advised should be on plain Mucinex, not DM.  Has been taking plain all along, chart had DM Advised of labs and mailed copy

## 2011-05-09 ENCOUNTER — Ambulatory Visit (INDEPENDENT_AMBULATORY_CARE_PROVIDER_SITE_OTHER): Payer: Self-pay | Admitting: Cardiovascular Disease

## 2011-05-09 DIAGNOSIS — R0989 Other specified symptoms and signs involving the circulatory and respiratory systems: Secondary | ICD-10-CM

## 2011-05-09 DIAGNOSIS — Z953 Presence of xenogenic heart valve: Secondary | ICD-10-CM

## 2011-05-09 DIAGNOSIS — I359 Nonrheumatic aortic valve disorder, unspecified: Secondary | ICD-10-CM

## 2011-05-09 DIAGNOSIS — I4891 Unspecified atrial fibrillation: Secondary | ICD-10-CM

## 2011-05-15 ENCOUNTER — Other Ambulatory Visit: Payer: Medicare Other | Admitting: *Deleted

## 2011-05-15 ENCOUNTER — Ambulatory Visit: Payer: Medicare Other | Admitting: Cardiology

## 2011-06-13 ENCOUNTER — Other Ambulatory Visit: Payer: Self-pay | Admitting: *Deleted

## 2011-06-13 MED ORDER — CLONIDINE HCL 0.1 MG PO TABS: 0.2000 mg | ORAL_TABLET | Freq: Two times a day (BID) | ORAL | Status: DC

## 2011-06-13 NOTE — Telephone Encounter (Signed)
Refilled clonidine.

## 2011-06-22 LAB — POCT INR: INR: 2.6

## 2011-06-25 ENCOUNTER — Ambulatory Visit (INDEPENDENT_AMBULATORY_CARE_PROVIDER_SITE_OTHER): Payer: Self-pay | Admitting: Cardiology

## 2011-06-25 DIAGNOSIS — Z953 Presence of xenogenic heart valve: Secondary | ICD-10-CM

## 2011-06-25 DIAGNOSIS — R0989 Other specified symptoms and signs involving the circulatory and respiratory systems: Secondary | ICD-10-CM

## 2011-06-25 DIAGNOSIS — I4891 Unspecified atrial fibrillation: Secondary | ICD-10-CM

## 2011-06-25 DIAGNOSIS — I359 Nonrheumatic aortic valve disorder, unspecified: Secondary | ICD-10-CM

## 2011-06-25 NOTE — Patient Instructions (Signed)
Patient to have his INR rechecked in 6 weeks at [facility]

## 2011-07-02 ENCOUNTER — Other Ambulatory Visit: Payer: Self-pay | Admitting: *Deleted

## 2011-07-02 DIAGNOSIS — I119 Hypertensive heart disease without heart failure: Secondary | ICD-10-CM

## 2011-07-02 MED ORDER — VALSARTAN-HYDROCHLOROTHIAZIDE 160-12.5 MG PO TABS: 1.0000 | ORAL_TABLET | Freq: Every day | ORAL | Status: DC

## 2011-07-02 NOTE — Telephone Encounter (Signed)
Refilled valsartan

## 2011-07-06 ENCOUNTER — Other Ambulatory Visit: Payer: Self-pay | Admitting: *Deleted

## 2011-07-06 MED ORDER — SPIRONOLACTONE 25 MG PO TABS: 25.0000 mg | ORAL_TABLET | Freq: Every day | ORAL | Status: DC

## 2011-07-06 NOTE — Telephone Encounter (Signed)
Refilled spironolactone

## 2011-07-13 ENCOUNTER — Telehealth: Payer: Self-pay | Admitting: *Deleted

## 2011-07-13 NOTE — Telephone Encounter (Signed)
Received letter from patient stating Diovan no longer covered under insurance.   Dr. Patty Sermons said ok to change to Losartan HCT 100/25 mg daily.  Left message for patient to call back

## 2011-07-16 ENCOUNTER — Telehealth: Payer: Self-pay | Admitting: Cardiology

## 2011-07-16 NOTE — Telephone Encounter (Signed)
Patient wanted Evan Perez to know that the is taking Diovan HCT 160/12.5.

## 2011-07-16 NOTE — Telephone Encounter (Signed)
New Problem:     Patient called returning Melinda's call from Friday regarding his insurance company not covering his valsartan-hydrochlorothiazide (DIOVAN HCT) 160-12.5 MG per tablet. He wanted to receive a call back to clarify a few things. Please call back.

## 2011-07-17 NOTE — Telephone Encounter (Signed)
Noted will continue current medications

## 2011-07-17 NOTE — Telephone Encounter (Signed)
Leeanne Mannan, [place] 1/61/0960 45:40 PM Signed  Patient wanted Juliette Alcide to know that the is taking Diovan HCT 160/12.5.  Patient phoned back and insurance will cover name brand Diovan HCT, ok to continue

## 2011-08-07 ENCOUNTER — Telehealth: Payer: Self-pay | Admitting: Cardiology

## 2011-08-07 LAB — PROTIME-INR: INR: 2.7 — AB (ref 0.9–1.1)

## 2011-08-07 NOTE — Telephone Encounter (Signed)
New Msg: [facility] calling needing standing order for PT/INR. Please include start date, end date and frequency.  Please return call to discuss further if necessary.  Pt is in the office now. Please send order ASAP.  FAX# [phone]

## 2011-08-07 NOTE — Telephone Encounter (Signed)
PT/INR order written and faxed to [facility].

## 2011-08-08 ENCOUNTER — Ambulatory Visit: Payer: Self-pay | Admitting: Cardiology

## 2011-08-08 DIAGNOSIS — I359 Nonrheumatic aortic valve disorder, unspecified: Secondary | ICD-10-CM

## 2011-08-08 DIAGNOSIS — Z953 Presence of xenogenic heart valve: Secondary | ICD-10-CM

## 2011-08-08 DIAGNOSIS — I4891 Unspecified atrial fibrillation: Secondary | ICD-10-CM

## 2011-08-30 ENCOUNTER — Ambulatory Visit: Payer: Medicare Other | Admitting: Cardiology

## 2011-09-18 ENCOUNTER — Encounter: Payer: Self-pay | Admitting: Cardiology

## 2011-09-18 ENCOUNTER — Ambulatory Visit: Payer: Medicare Other | Admitting: Cardiology

## 2011-09-18 ENCOUNTER — Ambulatory Visit (INDEPENDENT_AMBULATORY_CARE_PROVIDER_SITE_OTHER): Payer: Medicare Other

## 2011-09-18 ENCOUNTER — Ambulatory Visit (INDEPENDENT_AMBULATORY_CARE_PROVIDER_SITE_OTHER): Payer: Medicare Other | Admitting: Cardiology

## 2011-09-18 VITALS — BP 148/84 | HR 76 | Ht 70.0 in | Wt 169.0 lb

## 2011-09-18 DIAGNOSIS — Z953 Presence of xenogenic heart valve: Secondary | ICD-10-CM

## 2011-09-18 DIAGNOSIS — I359 Nonrheumatic aortic valve disorder, unspecified: Secondary | ICD-10-CM

## 2011-09-18 DIAGNOSIS — I4891 Unspecified atrial fibrillation: Secondary | ICD-10-CM

## 2011-09-18 DIAGNOSIS — I119 Hypertensive heart disease without heart failure: Secondary | ICD-10-CM

## 2011-09-18 DIAGNOSIS — Z952 Presence of prosthetic heart valve: Secondary | ICD-10-CM

## 2011-09-18 DIAGNOSIS — E785 Hyperlipidemia, unspecified: Secondary | ICD-10-CM

## 2011-09-18 LAB — LIPID PANEL
Cholesterol: 196 mg/dL (ref 0–200)
HDL: 53.5 mg/dL (ref >39.00)
Triglycerides: 75 mg/dL (ref 0.0–149.0)

## 2011-09-18 LAB — BASIC METABOLIC PANEL
Calcium: 9.1 mg/dL (ref 8.4–10.5)
Creatinine, Ser: 2.1 mg/dL — ABNORMAL HIGH (ref 0.4–1.5)
GFR: 32.46 mL/min — ABNORMAL LOW (ref >60.00)
Sodium: 136 mEq/L (ref 135–145)

## 2011-09-18 LAB — HEPATIC FUNCTION PANEL
ALT: 27 U/L (ref 0–53)
AST: 25 U/L (ref 0–37)
Albumin: 4.3 g/dL (ref 3.5–5.2)
Alkaline Phosphatase: 61 U/L (ref 39–117)

## 2011-09-18 NOTE — Progress Notes (Signed)
Evan Perez Date of Birth:  1922-07-10 [facility] [address] [place], [place]  [address] [phone]         Fax   [phone]  History of Present Illness: This pleasant 78 year old gentleman is seen for a scheduled followup office visit.  He recently returned from [place] where he spent the winter months.  He has a history of severe aortic stenosis and on 04/12/10 he underwent aortic valve replacement with a pericardial tissue valve.  Her stop Lahey had a long period of problems with congestive heart failure which gradually resolved.  He is not having any chest pain or shortness of breath.  He has had a cough and sinus congestion and is being seen by an allergist.  He is now living at [facility] and is very pleased with his amenities there.  Current Outpatient Prescriptions  Medication Sig Dispense Refill  . aspirin 81 MG tablet Take 81 mg by mouth daily. Take 3 x week      . cloNIDine (CATAPRES) 0.1 MG tablet Take 2 tablets (0.2 mg total) by mouth 2 (two) times daily.  60 tablet  11  . colesevelam (WELCHOL) 625 MG tablet Take 1,875 mg by mouth 2 (two) times daily with a meal.        . ferrous gluconate (FERGON) 325 MG tablet Take 1 tablet (325 mg total) by mouth daily with breakfast.  30 tablet  3  . guaiFENesin (MUCINEX) 600 MG 12 hr tablet Take 1,200 mg by mouth 2 (two) times daily as needed.        Marland Kitchen levothyroxine (SYNTHROID, LEVOTHROID) 25 MCG tablet Take 25 mcg by mouth daily.        . multivitamin (THERAGRAN) per tablet Take 1 tablet by mouth daily.        Marland Kitchen spironolactone (ALDACTONE) 25 MG tablet Take 1 tablet (25 mg total) by mouth daily. 3 x week  30 tablet  11  . valsartan-hydrochlorothiazide (DIOVAN HCT) 160-12.5 MG per tablet Take 1 tablet by mouth daily.  30 tablet  11  . warfarin (COUMADIN) 5 MG tablet Take 5 mg by mouth daily. As directed        Allergies  Allergen Reactions  . Altace   . Lipitor (Atorvastatin Calcium)   . Zocor  (Simvastatin)     Patient Active Problem List  Diagnoses  . HYPERLIPIDEMIA  . GOUT  . HYPERTENSION  . AORTIC STENOSIS, SEVERE  . Atrial fibrillation  . URI  . BENIGN PROSTATIC HYPERTROPHY  . ERECTILE DYSFUNCTION, ORGANIC  . BACK PAIN  . POLYMYALGIA RHEUMATICA  . DYSPNEA  . S/P aortic valve replacement with porcine valve  . Drug-induced skin rash  . Perennial allergic rhinitis    History  Smoking status  . Former Smoker  . Quit date: 06/04/1988  Smokeless tobacco  . Never Used    History  Alcohol Use  . Yes    Family History  Problem Relation Age of Onset  . Hypertension Mother   . Heart disease Mother   . Hypertension Father     Review of Systems: Constitutional: no fever chills diaphoresis or fatigue or change in weight.  Head and neck: no hearing loss, no epistaxis, no photophobia or visual disturbance. Respiratory: No cough, shortness of breath or wheezing. Cardiovascular: No chest pain peripheral edema, palpitations. Gastrointestinal: No abdominal distention, no abdominal pain, no change in bowel habits hematochezia or melena. Genitourinary: No dysuria, no frequency, no urgency, no nocturia. Musculoskeletal:No arthralgias, no  back pain, no gait disturbance or myalgias. Neurological: No dizziness, no headaches, no numbness, no seizures, no syncope, no weakness, no tremors. Hematologic: No lymphadenopathy, no easy bruising. Psychiatric: No confusion, no hallucinations, no sleep disturbance.    Physical Exam: Filed Vitals:   09/18/11 1041  BP: 148/84  Pulse: 76   the general appearance reveals a well-developed well-nourished gentleman in no distress.Pupils equal and reactive.   Extraocular Movements are full.  There is no scleral icterus.  The mouth and pharynx are normal.  The neck is supple.  The carotids reveal no bruits.  The jugular venous pressure is normal.  The thyroid is not enlarged.  There is no lymphadenopathy.  The chest is clear to  percussion and auscultation. There are no rales or rhonchi. Expansion of the chest is symmetrical.  The heart reveals a soft systolic ejection murmur at the base.  The rhythm is irregular in atrial fibrillation.  No  gallop or rub The abdomen is soft and nontender. Bowel sounds are normal. The liver and spleen are not enlarged. There Are no abdominal masses. There are no bruits.  The pedal pulses are good.  There is no phlebitis or edema.  There is no cyanosis or clubbing. Strength is normal and symmetrical in all extremities.  There is no lateralizing weakness.  There are no sensory deficits.     Assessment / Plan: Continue same medication.  Recheck in 4 months for followup office visit and EKG

## 2011-09-18 NOTE — Patient Instructions (Signed)
Will obtain labs today and call you with the results (LP/BMET/HFP)  Your physician recommends that you continue on your current medications as directed. Please refer to the Current Medication list given to you today.  Your physician recommends that you schedule a follow-up appointment in: 4 months for office visit and EKG

## 2011-09-18 NOTE — Assessment & Plan Note (Signed)
The patient has a history of hypercholesterolemia.  He is intolerant of statins.  He is on WelChol.  Checking lab work today

## 2011-09-18 NOTE — Assessment & Plan Note (Signed)
The patient has not had any symptoms of congestive heart failure.  He is not having any peripheral edema.  He has some mild increase in abdominal girth which is probably related to his diet and he intends to be more physically active going forward.

## 2011-09-18 NOTE — Assessment & Plan Note (Signed)
The patient has been in chronic atrial fibrillation for many years.  He is on long-term Coumadin.  He has not had any complications from the Coumadin.  No TIA symptoms

## 2011-09-18 NOTE — Progress Notes (Signed)
Quick Note:  Please report to patient. The recent labs are stable. Continue same medication and careful diet. The LDL is higher. Watch diet, increase exercise. The kidney function is better. ______

## 2011-09-19 ENCOUNTER — Other Ambulatory Visit: Payer: Self-pay | Admitting: Cardiology

## 2011-09-19 DIAGNOSIS — D649 Anemia, unspecified: Secondary | ICD-10-CM

## 2011-09-19 MED ORDER — FERROUS GLUCONATE 325 MG PO TABS: 325.0000 mg | ORAL_TABLET | Freq: Every day | ORAL | Status: DC

## 2011-09-20 ENCOUNTER — Telehealth: Payer: Self-pay | Admitting: *Deleted

## 2011-09-20 MED ORDER — AMOXICILLIN 500 MG PO CAPS: ORAL_CAPSULE | ORAL | Status: DC

## 2011-09-20 NOTE — Telephone Encounter (Signed)
Message copied by Burnell Blanks on Thu Sep 20, 2011  5:38 PM ------      Message from: Cassell Clement      Created: Tue Sep 18, 2011  7:23 PM       Please report to patient.  The recent labs are stable. Continue same medication and careful diet. The LDL is higher.  Watch diet, increase exercise.  The kidney function is better.

## 2011-09-20 NOTE — Telephone Encounter (Signed)
Advised of labs  Faxed signed Rx for Amoxil back to pharmacy

## 2011-10-03 ENCOUNTER — Telehealth: Payer: Self-pay | Admitting: Cardiology

## 2011-10-03 NOTE — Telephone Encounter (Signed)
Gave [facility] afib diagnosis.

## 2011-10-03 NOTE — Telephone Encounter (Signed)
New msg [facility] called to get dx . Please call them back

## 2011-10-30 ENCOUNTER — Ambulatory Visit: Payer: Self-pay | Admitting: Cardiology

## 2011-10-30 DIAGNOSIS — Z953 Presence of xenogenic heart valve: Secondary | ICD-10-CM

## 2011-10-30 DIAGNOSIS — I359 Nonrheumatic aortic valve disorder, unspecified: Secondary | ICD-10-CM

## 2011-10-30 DIAGNOSIS — I4891 Unspecified atrial fibrillation: Secondary | ICD-10-CM

## 2011-12-11 ENCOUNTER — Ambulatory Visit: Payer: Self-pay | Admitting: Cardiology

## 2011-12-11 DIAGNOSIS — I4891 Unspecified atrial fibrillation: Secondary | ICD-10-CM

## 2011-12-11 DIAGNOSIS — Z953 Presence of xenogenic heart valve: Secondary | ICD-10-CM

## 2011-12-11 DIAGNOSIS — I359 Nonrheumatic aortic valve disorder, unspecified: Secondary | ICD-10-CM

## 2012-01-09 ENCOUNTER — Emergency Department (HOSPITAL_BASED_OUTPATIENT_CLINIC_OR_DEPARTMENT_OTHER)
Admission: EM | Admit: 2012-01-09 | Discharge: 2012-01-09 | Disposition: A | Discharge status: Home / Self Care | Payer: Medicare Other | Attending: Emergency Medicine | Admitting: Emergency Medicine

## 2012-01-09 ENCOUNTER — Emergency Department (HOSPITAL_BASED_OUTPATIENT_CLINIC_OR_DEPARTMENT_OTHER): Payer: Medicare Other

## 2012-01-09 ENCOUNTER — Encounter (HOSPITAL_BASED_OUTPATIENT_CLINIC_OR_DEPARTMENT_OTHER): Payer: Self-pay

## 2012-01-09 DIAGNOSIS — Z79899 Other long term (current) drug therapy: Secondary | ICD-10-CM | POA: Insufficient documentation

## 2012-01-09 DIAGNOSIS — S0990XA Unspecified injury of head, initial encounter: Secondary | ICD-10-CM | POA: Insufficient documentation

## 2012-01-09 DIAGNOSIS — I4891 Unspecified atrial fibrillation: Secondary | ICD-10-CM | POA: Insufficient documentation

## 2012-01-09 DIAGNOSIS — S61409A Unspecified open wound of unspecified hand, initial encounter: Secondary | ICD-10-CM | POA: Insufficient documentation

## 2012-01-09 DIAGNOSIS — I10 Essential (primary) hypertension: Secondary | ICD-10-CM | POA: Insufficient documentation

## 2012-01-09 DIAGNOSIS — Y9210 Unspecified residential institution as the place of occurrence of the external cause: Secondary | ICD-10-CM | POA: Insufficient documentation

## 2012-01-09 DIAGNOSIS — Z87891 Personal history of nicotine dependence: Secondary | ICD-10-CM | POA: Insufficient documentation

## 2012-01-09 DIAGNOSIS — Z7982 Long term (current) use of aspirin: Secondary | ICD-10-CM | POA: Insufficient documentation

## 2012-01-09 DIAGNOSIS — I509 Heart failure, unspecified: Secondary | ICD-10-CM | POA: Insufficient documentation

## 2012-01-09 DIAGNOSIS — S61411A Laceration without foreign body of right hand, initial encounter: Secondary | ICD-10-CM

## 2012-01-09 DIAGNOSIS — W010XXA Fall on same level from slipping, tripping and stumbling without subsequent striking against object, initial encounter: Secondary | ICD-10-CM | POA: Insufficient documentation

## 2012-01-09 MED ORDER — NAPROXEN 250 MG PO TABS
ORAL_TABLET | ORAL | Status: AC
  Filled 2012-01-09: qty 1

## 2012-01-09 MED ORDER — NAPROXEN 250 MG PO TABS
250.0000 mg | ORAL_TABLET | Freq: Once | ORAL | Status: AC
  Administered 2012-01-09: 250 mg via ORAL

## 2012-01-09 MED ORDER — NAPROXEN 500 MG PO TABS: 500.0000 mg | ORAL_TABLET | Freq: Two times a day (BID) | ORAL | Status: DC

## 2012-01-09 MED ORDER — BACITRACIN 500 UNIT/GM EX OINT
1.0000 "application " | TOPICAL_OINTMENT | Freq: Two times a day (BID) | CUTANEOUS | Status: DC
  Administered 2012-01-09: 1 via TOPICAL
  Filled 2012-01-09: qty 0.9
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
## 2012-01-09 NOTE — ED Notes (Signed)
Denies LOC

## 2012-01-15 ENCOUNTER — Ambulatory Visit (INDEPENDENT_AMBULATORY_CARE_PROVIDER_SITE_OTHER): Payer: Medicare Other | Admitting: *Deleted

## 2012-01-15 ENCOUNTER — Encounter: Payer: Self-pay | Admitting: Cardiology

## 2012-01-15 ENCOUNTER — Ambulatory Visit (INDEPENDENT_AMBULATORY_CARE_PROVIDER_SITE_OTHER): Payer: Medicare Other | Admitting: Cardiology

## 2012-01-15 VITALS — BP 138/82 | HR 69 | Ht 70.0 in | Wt 173.2 lb

## 2012-01-15 DIAGNOSIS — Z953 Presence of xenogenic heart valve: Secondary | ICD-10-CM

## 2012-01-15 DIAGNOSIS — M109 Gout, unspecified: Secondary | ICD-10-CM

## 2012-01-15 DIAGNOSIS — I359 Nonrheumatic aortic valve disorder, unspecified: Secondary | ICD-10-CM

## 2012-01-15 DIAGNOSIS — Z954 Presence of other heart-valve replacement: Secondary | ICD-10-CM

## 2012-01-15 DIAGNOSIS — D649 Anemia, unspecified: Secondary | ICD-10-CM

## 2012-01-15 DIAGNOSIS — I4891 Unspecified atrial fibrillation: Secondary | ICD-10-CM

## 2012-01-15 LAB — POCT INR: INR: 2.4

## 2012-01-15 NOTE — Assessment & Plan Note (Signed)
Patient is doing well.  He denies any orthopnea or paroxysmal nocturnal dyspnea or peripheral edema.  He is not having any significant exertional dyspnea.

## 2012-01-15 NOTE — Patient Instructions (Addendum)
Will obtain labs today and call you with the results Your physician recommends that you continue on your current medications as directed. Please refer to the Current Medication list given to you today. Your physician recommends that you schedule a follow-up appointment in: 4 months  

## 2012-01-15 NOTE — Assessment & Plan Note (Signed)
The patient remains in chronic atrial fibrillation on Coumadin.  EKG today shows satisfactory ventricular response.  Patient has had no TIA symptoms or embolic symptoms

## 2012-01-15 NOTE — Progress Notes (Signed)
Evan Perez Date of Birth:  1922-10-18 [facility] [address] [place], [place]  [address] [phone]         Fax   [phone]  History of Present Illness: This pleasant 78 year old gentleman who is a resident of [facility] is seen for a scheduled followup visit.  He has a past history of previous severe aortic stenosis.  An 04/12/10 he underwent aortic valve replacement with a pericardial tissue valve by Dr. Laneta Simmers.  Postoperatively the patient had a lot of problems with fluid retention and congestive heart failure which gradually resolved.  He has had a persistent cough and sinus congestion and is being followed by the bowel or allergy.  He is not having any symptoms of ongoing congestive heart failure.  He does have a ventral hernia which he attributes to his marked fluid retention postoperatively and which he states was not present preoperatively.  He was wondering about getting his ventral hernia fixed but I suggested that he probably should not have any more elective surgery at this point unless a more urgent situation.  The patient has not been expressing any chest pain.  His activity has improved and he is starting to play golf again.  Current Outpatient Prescriptions  Medication Sig Dispense Refill  . aspirin 81 MG tablet Take 81 mg by mouth daily. Take 3 x week      . cloNIDine (CATAPRES) 0.1 MG tablet Take 2 tablets (0.2 mg total) by mouth 2 (two) times daily.  60 tablet  11  . colesevelam (WELCHOL) 625 MG tablet Take 1,875 mg by mouth 2 (two) times daily with a meal.       . ferrous gluconate (FERGON) 325 MG tablet Take 1 tablet (325 mg total) by mouth daily with breakfast.  30 tablet  3  . guaiFENesin (MUCINEX) 600 MG 12 hr tablet Take 1,200 mg by mouth 2 (two) times daily as needed. For congestion.      Marland Kitchen levothyroxine (SYNTHROID, LEVOTHROID) 25 MCG tablet Take 25 mcg by mouth daily.        . multivitamin (THERAGRAN) per tablet  Take 1 tablet by mouth daily.       Marland Kitchen spironolactone (ALDACTONE) 25 MG tablet Take 1 tablet (25 mg total) by mouth daily. 3 x week  30 tablet  11  . valsartan-hydrochlorothiazide (DIOVAN HCT) 160-12.5 MG per tablet Take 1 tablet by mouth daily.  30 tablet  11  . warfarin (COUMADIN) 5 MG tablet Take 5 mg by mouth daily. As directed        Allergies  Allergen Reactions  . Lipitor (Atorvastatin Calcium)   . Ramipril   . Zocor (Simvastatin)     Patient Active Problem List  Diagnosis  . HYPERLIPIDEMIA  . GOUT  . HYPERTENSION  . AORTIC STENOSIS, SEVERE  . Atrial fibrillation  . URI  . BENIGN PROSTATIC HYPERTROPHY  . ERECTILE DYSFUNCTION, ORGANIC  . BACK PAIN  . POLYMYALGIA RHEUMATICA  . DYSPNEA  . Drug-induced skin rash  . Perennial allergic rhinitis  . Status post aortic valve replacement with bioprosthetic valve    History  Smoking status  . Former Smoker  . Quit date: 06/04/1988  Smokeless tobacco  . Never Used    History  Alcohol Use  . Yes    Family History  Problem Relation Age of Onset  . Hypertension Mother   . Heart disease Mother   . Hypertension Father     Review of Systems:  Constitutional: no fever chills diaphoresis or fatigue or change in weight.  Head and neck: no hearing loss, no epistaxis, no photophobia or visual disturbance. Respiratory: No cough, shortness of breath or wheezing. Cardiovascular: No chest pain peripheral edema, palpitations. Gastrointestinal: No abdominal distention, no abdominal pain, no change in bowel habits hematochezia or melena. Genitourinary: No dysuria, no frequency, no urgency, no nocturia. Musculoskeletal:No arthralgias, no back pain, no gait disturbance or myalgias. Neurological: No dizziness, no headaches, no numbness, no seizures, no syncope, no weakness, no tremors. Hematologic: No lymphadenopathy, no easy bruising. Psychiatric: No confusion, no hallucinations, no sleep disturbance.    Physical Exam: Filed  Vitals:   01/15/12 1548  BP: 138/82  Pulse: 69   the general appearance reveals a healthy-appearing elderly gentleman in no distress.Pupils equal and reactive.   Extraocular Movements are full.  There is no scleral icterus.  The mouth and pharynx are normal.  The neck is supple.  The carotids reveal no bruits.  The jugular venous pressure is normal.  The thyroid is not enlarged.  There is no lymphadenopathy.  The chest is clear to percussion and auscultation. There are no rales or rhonchi. Expansion of the chest is symmetrical.  The heart reveals good opening and closing aortic valve sounds and no aortic insufficiency.  No pericardial rub and no gallop   Abdomen reveals a small ventral umbilical hernia.  Liver and spleen not enlarged Extremities reveal no peripheral edema Strength is normal and symmetrical in all extremities.  There is no lateralizing weakness.  There are no sensory deficits.  The skin is warm and dry.  There is no rash.  EKG shows atrial fibrillation and no ischemic changes   Assessment / Plan:  Continue on same medication.  We are checking a CBC today.  If he is no longer anemic he would like to stop his oral iron therapy. Recheck in 4 months for followup office visit

## 2012-01-15 NOTE — Assessment & Plan Note (Signed)
The patient has not had any recent flareup of acute gout

## 2012-01-16 ENCOUNTER — Telehealth: Payer: Self-pay | Admitting: *Deleted

## 2012-01-16 DIAGNOSIS — D649 Anemia, unspecified: Secondary | ICD-10-CM

## 2012-01-16 LAB — CBC WITH DIFFERENTIAL/PLATELET
Basophils Relative: 0.1 % (ref 0.0–3.0)
Eosinophils Relative: 8.8 % — ABNORMAL HIGH (ref 0.0–5.0)
HCT: 40.2 % (ref 39.0–52.0)
Hemoglobin: 13.4 g/dL (ref 13.0–17.0)
Lymphs Abs: 1 10*3/uL (ref 0.7–4.0)
Monocytes Relative: 12.9 % — ABNORMAL HIGH (ref 3.0–12.0)
Neutro Abs: 4.3 10*3/uL (ref 1.4–7.7)
RBC: 4.3 Mil/uL (ref 4.22–5.81)
WBC: 6.9 10*3/uL (ref 4.5–10.5)

## 2012-01-16 NOTE — Telephone Encounter (Signed)
I reviewed the two new nasal inhaler drugs

## 2012-01-16 NOTE — Telephone Encounter (Signed)
Message copied by Burnell Blanks on Wed Jan 16, 2012  5:24 PM ------      Message from: Cassell Clement      Created: Wed Jan 16, 2012  3:25 PM       His hemoglobin has come up to normal finally so we can stop his iron tablets as he has requested.  Recheck a CBC at his next visit.

## 2012-01-16 NOTE — Telephone Encounter (Signed)
Advised patient of lab results and added cbc to next ov  Added 2 medications Rx'd by allergist, will forward to  Dr. Patty Sermons for review

## 2012-01-22 ENCOUNTER — Ambulatory Visit: Payer: Medicare Other | Admitting: Cardiology

## 2012-02-26 ENCOUNTER — Ambulatory Visit: Payer: Self-pay | Admitting: Cardiovascular Disease

## 2012-02-26 DIAGNOSIS — I4891 Unspecified atrial fibrillation: Secondary | ICD-10-CM

## 2012-02-26 DIAGNOSIS — I359 Nonrheumatic aortic valve disorder, unspecified: Secondary | ICD-10-CM

## 2012-02-26 LAB — POCT INR: INR: 2.6

## 2012-04-08 ENCOUNTER — Ambulatory Visit: Payer: Self-pay | Admitting: Cardiology

## 2012-04-08 DIAGNOSIS — I4891 Unspecified atrial fibrillation: Secondary | ICD-10-CM

## 2012-04-08 DIAGNOSIS — I359 Nonrheumatic aortic valve disorder, unspecified: Secondary | ICD-10-CM

## 2012-05-07 ENCOUNTER — Ambulatory Visit: Payer: Self-pay | Admitting: Internal Medicine

## 2012-05-07 DIAGNOSIS — I359 Nonrheumatic aortic valve disorder, unspecified: Secondary | ICD-10-CM

## 2012-05-07 DIAGNOSIS — I4891 Unspecified atrial fibrillation: Secondary | ICD-10-CM

## 2012-05-13 ENCOUNTER — Ambulatory Visit
Admission: RE | Admit: 2012-05-13 | Discharge: 2012-05-13 | Disposition: A | Discharge status: Home / Self Care | Payer: Medicare Other | Source: Ambulatory Visit | Attending: Allergy and Immunology | Admitting: Allergy and Immunology

## 2012-05-13 ENCOUNTER — Other Ambulatory Visit: Payer: Self-pay | Admitting: Allergy and Immunology

## 2012-05-13 DIAGNOSIS — R059 Cough, unspecified: Secondary | ICD-10-CM

## 2012-05-13 DIAGNOSIS — R05 Cough: Secondary | ICD-10-CM

## 2012-05-14 ENCOUNTER — Ambulatory Visit (INDEPENDENT_AMBULATORY_CARE_PROVIDER_SITE_OTHER): Payer: Medicare Other | Admitting: Cardiology

## 2012-05-14 ENCOUNTER — Other Ambulatory Visit (INDEPENDENT_AMBULATORY_CARE_PROVIDER_SITE_OTHER): Payer: Medicare Other

## 2012-05-14 ENCOUNTER — Encounter: Payer: Self-pay | Admitting: Cardiology

## 2012-05-14 VITALS — BP 138/70 | HR 64 | Ht 70.0 in | Wt 172.0 lb

## 2012-05-14 DIAGNOSIS — R0989 Other specified symptoms and signs involving the circulatory and respiratory systems: Secondary | ICD-10-CM

## 2012-05-14 DIAGNOSIS — Z952 Presence of prosthetic heart valve: Secondary | ICD-10-CM

## 2012-05-14 DIAGNOSIS — Z953 Presence of xenogenic heart valve: Secondary | ICD-10-CM

## 2012-05-14 DIAGNOSIS — I10 Essential (primary) hypertension: Secondary | ICD-10-CM

## 2012-05-14 DIAGNOSIS — I4891 Unspecified atrial fibrillation: Secondary | ICD-10-CM

## 2012-05-14 DIAGNOSIS — Z954 Presence of other heart-valve replacement: Secondary | ICD-10-CM

## 2012-05-14 DIAGNOSIS — D649 Anemia, unspecified: Secondary | ICD-10-CM

## 2012-05-14 DIAGNOSIS — E7800 Pure hypercholesterolemia, unspecified: Secondary | ICD-10-CM

## 2012-05-14 NOTE — Patient Instructions (Addendum)
Your physician recommends that you continue on your current medications as directed. Please refer to the Current Medication list given to you today.  Your physician wants you to follow-up in: 4 months with fasting labs (lp/bmet/hfp) You will receive a reminder letter in the mail two months in advance. If you don't receive a letter, please call our office to schedule the follow-up appointment.  

## 2012-05-14 NOTE — Assessment & Plan Note (Signed)
Patient has a history of chronic established atrial fibrillation.  He remains on long-term Coumadin.  He has not had any TIA symptoms.  Because of his coronary artery disease he is also on a baby aspirin daily.

## 2012-05-14 NOTE — Assessment & Plan Note (Signed)
The patient has not been expressing any headaches or dizzy spells or syncope.  Her pressure has been remaining stable on current medication.

## 2012-05-14 NOTE — Progress Notes (Signed)
Evan Perez Date of Birth:  07-15-1922 [facility] [address] [place], [place]  [address] [phone]         Fax   [phone]  History of Present Illness: This pleasant 78 year old gentleman who is a resident of [facility] is seen for a scheduled followup visit. He has a past history of previous severe aortic stenosis. An 04/12/10 he underwent aortic valve replacement with a bovine pericardial tissue valve by Dr. Laneta Simmers.  His cardiac catheterization preoperatively showed only minimal coronary artery disease and he did not require coronary artery bypass graft surgery. Postoperatively the patient had a lot of problems with fluid retention and congestive heart failure which gradually resolved. He has had a persistent cough and sinus congestion and is being followed by [facility]. He is not having any symptoms of ongoing congestive heart failure.  He had a chest x-ray 05/13/12 which showed no change in chronic cardiomegaly and his lungs were clear and there was no evidence of congestive heart failure.    Current Outpatient Prescriptions  Medication Sig Dispense Refill  . aspirin 81 MG tablet Take 81 mg by mouth daily. Take 3 x week      . azelastine (ASTELIN) 137 MCG/SPRAY nasal spray Place 1 spray into the nose 2 (two) times daily. Use in each nostril as directed      . cloNIDine (CATAPRES) 0.1 MG tablet Take 2 tablets (0.2 mg total) by mouth 2 (two) times daily.  60 tablet  11  . colesevelam (WELCHOL) 625 MG tablet Take 1,875 mg by mouth 2 (two) times daily with a meal.       . guaiFENesin (MUCINEX) 600 MG 12 hr tablet Take 1,200 mg by mouth 2 (two) times daily as needed. For congestion.      Marland Kitchen ipratropium (ATROVENT) 0.03 % nasal spray Place 2 sprays into the nose 2 (two) times daily.      Marland Kitchen levothyroxine (SYNTHROID, LEVOTHROID) 25 MCG tablet Take 25 mcg by mouth daily.        Marland Kitchen loratadine (CLARITIN) 10 MG tablet Take 10 mg by mouth  daily.      . mometasone (NASONEX) 50 MCG/ACT nasal spray Place 2 sprays into the nose daily.      . multivitamin (THERAGRAN) per tablet Take 1 tablet by mouth daily.       Marland Kitchen spironolactone (ALDACTONE) 25 MG tablet Take 1 tablet (25 mg total) by mouth daily. 3 x week  30 tablet  11  . valsartan-hydrochlorothiazide (DIOVAN HCT) 160-12.5 MG per tablet Take 1 tablet by mouth daily.  30 tablet  11  . warfarin (COUMADIN) 5 MG tablet Take 5 mg by mouth daily. As directed        Allergies  Allergen Reactions  . Dust Mite Extract   . Lipitor (Atorvastatin Calcium)   . Ramipril   . Zocor (Simvastatin)     Patient Active Problem List  Diagnosis  . HYPERLIPIDEMIA  . GOUT  . HYPERTENSION  . AORTIC STENOSIS, SEVERE  . Atrial fibrillation  . URI  . BENIGN PROSTATIC HYPERTROPHY  . ERECTILE DYSFUNCTION, ORGANIC  . BACK PAIN  . POLYMYALGIA RHEUMATICA  . DYSPNEA  . Drug-induced skin rash  . Perennial allergic rhinitis  . Status post aortic valve replacement with bioprosthetic valve    History  Smoking status  . Former Smoker  . Quit date: 06/04/1988  Smokeless tobacco  . Never Used    History  Alcohol Use  .  Yes    Family History  Problem Relation Age of Onset  . Hypertension Mother   . Heart disease Mother   . Hypertension Father     Review of Systems: Constitutional: no fever chills diaphoresis or fatigue or change in weight.  Head and neck: no hearing loss, no epistaxis, no photophobia or visual disturbance. Respiratory: No cough, shortness of breath or wheezing. Cardiovascular: No chest pain peripheral edema, palpitations. Gastrointestinal: No abdominal distention, no abdominal pain, no change in bowel habits hematochezia or melena. Genitourinary: No dysuria, no frequency, no urgency, no nocturia. Musculoskeletal:No arthralgias, no back pain, no gait disturbance or myalgias. Neurological: No dizziness, no headaches, no numbness, no seizures, no syncope, no weakness, no  tremors. Hematologic: No lymphadenopathy, no easy bruising. Psychiatric: No confusion, no hallucinations, no sleep disturbance.    Physical Exam: Filed Vitals:   05/14/12 1038  BP: 138/70  Pulse: 64   general appearance reveals a well-developed well-nourished elderly gentleman in no distress.The head and neck exam reveals pupils equal and reactive.  Extraocular movements are full.  There is no scleral icterus.  The mouth and pharynx are normal.  The neck is supple.  The carotids reveal no bruits.  The jugular venous pressure is normal.  The  thyroid is not enlarged.  There is no lymphadenopathy.  The chest is clear to percussion and auscultation.  There are no rales or rhonchi.  Expansion of the chest is symmetrical.  The precordium is quiet.  The first heart sound is normal.  The second heart sound is physiologically split.  There is a soft systolic ejection murmur at the base.  No diastolic murmur.  There is no abnormal lift or heave.  The abdomen is soft and nontender.  The bowel sounds are normal.  The liver and spleen are not enlarged.  There are no abdominal masses.  There are no abdominal bruits.  Extremities reveal good pedal pulses.  There is no phlebitis or edema.  There is no cyanosis or clubbing.  Strength is normal and symmetrical in all extremities.  There is no lateralizing weakness.  There are no sensory deficits.  The skin is warm and dry.  There is no rash.     Assessment / Plan: Continue same medication.  Recheck in 4 months for office visit EKG and we will also get a CBC to be sure that he can continue to stay off iron therapy.

## 2012-05-14 NOTE — Assessment & Plan Note (Signed)
The patient continues to have good exercise tolerance.  No symptoms of congestive heart failure. He knows about SBE prophylaxis for dental work

## 2012-06-02 ENCOUNTER — Other Ambulatory Visit: Payer: Self-pay

## 2012-06-02 ENCOUNTER — Ambulatory Visit: Payer: Self-pay | Admitting: Internal Medicine

## 2012-06-02 DIAGNOSIS — I359 Nonrheumatic aortic valve disorder, unspecified: Secondary | ICD-10-CM

## 2012-06-02 DIAGNOSIS — I4891 Unspecified atrial fibrillation: Secondary | ICD-10-CM

## 2012-06-02 LAB — POCT INR: INR: 2.9

## 2012-06-02 MED ORDER — LEVOTHYROXINE SODIUM 25 MCG PO TABS: 25.0000 ug | ORAL_TABLET | Freq: Every day | ORAL | Status: DC

## 2012-07-01 LAB — POCT INR: INR: 2.7

## 2012-07-02 ENCOUNTER — Ambulatory Visit: Payer: Self-pay | Admitting: Internal Medicine

## 2012-07-02 DIAGNOSIS — I4891 Unspecified atrial fibrillation: Secondary | ICD-10-CM

## 2012-07-02 DIAGNOSIS — I359 Nonrheumatic aortic valve disorder, unspecified: Secondary | ICD-10-CM

## 2012-07-09 ENCOUNTER — Other Ambulatory Visit: Payer: Self-pay | Admitting: *Deleted

## 2012-07-09 DIAGNOSIS — I119 Hypertensive heart disease without heart failure: Secondary | ICD-10-CM

## 2012-07-09 MED ORDER — VALSARTAN-HYDROCHLOROTHIAZIDE 160-12.5 MG PO TABS: 1.0000 | ORAL_TABLET | Freq: Every day | ORAL | Status: DC

## 2012-07-14 ENCOUNTER — Other Ambulatory Visit: Payer: Self-pay | Admitting: *Deleted

## 2012-07-14 MED ORDER — CLONIDINE HCL 0.1 MG PO TABS: 0.2000 mg | ORAL_TABLET | Freq: Two times a day (BID) | ORAL | Status: DC

## 2012-07-31 ENCOUNTER — Ambulatory Visit: Payer: Self-pay | Admitting: Internal Medicine

## 2012-07-31 DIAGNOSIS — I359 Nonrheumatic aortic valve disorder, unspecified: Secondary | ICD-10-CM

## 2012-07-31 DIAGNOSIS — I4891 Unspecified atrial fibrillation: Secondary | ICD-10-CM

## 2012-08-14 ENCOUNTER — Ambulatory Visit: Payer: Self-pay | Admitting: Cardiology

## 2012-08-14 DIAGNOSIS — I359 Nonrheumatic aortic valve disorder, unspecified: Secondary | ICD-10-CM

## 2012-08-14 DIAGNOSIS — I4891 Unspecified atrial fibrillation: Secondary | ICD-10-CM

## 2012-09-03 LAB — PROTIME-INR: INR: 2.7 — AB (ref 0.9–1.1)

## 2012-09-04 ENCOUNTER — Ambulatory Visit (INDEPENDENT_AMBULATORY_CARE_PROVIDER_SITE_OTHER): Payer: Medicare Other | Admitting: Internal Medicine

## 2012-09-04 DIAGNOSIS — I4891 Unspecified atrial fibrillation: Secondary | ICD-10-CM

## 2012-09-04 DIAGNOSIS — I359 Nonrheumatic aortic valve disorder, unspecified: Secondary | ICD-10-CM

## 2012-09-23 ENCOUNTER — Ambulatory Visit: Payer: Medicare Other | Admitting: Cardiology

## 2012-10-07 ENCOUNTER — Other Ambulatory Visit: Payer: Self-pay | Admitting: *Deleted

## 2012-10-07 ENCOUNTER — Encounter: Payer: Self-pay | Admitting: Cardiology

## 2012-10-07 ENCOUNTER — Ambulatory Visit (INDEPENDENT_AMBULATORY_CARE_PROVIDER_SITE_OTHER): Payer: Medicare Other | Admitting: Cardiology

## 2012-10-07 ENCOUNTER — Ambulatory Visit (INDEPENDENT_AMBULATORY_CARE_PROVIDER_SITE_OTHER): Payer: Medicare Other | Admitting: Pharmacist

## 2012-10-07 VITALS — BP 132/78 | HR 64 | Ht 70.0 in | Wt 169.8 lb

## 2012-10-07 DIAGNOSIS — I10 Essential (primary) hypertension: Secondary | ICD-10-CM

## 2012-10-07 DIAGNOSIS — I4891 Unspecified atrial fibrillation: Secondary | ICD-10-CM

## 2012-10-07 DIAGNOSIS — Z953 Presence of xenogenic heart valve: Secondary | ICD-10-CM

## 2012-10-07 DIAGNOSIS — I359 Nonrheumatic aortic valve disorder, unspecified: Secondary | ICD-10-CM

## 2012-10-07 DIAGNOSIS — Z954 Presence of other heart-valve replacement: Secondary | ICD-10-CM

## 2012-10-07 DIAGNOSIS — Z952 Presence of prosthetic heart valve: Secondary | ICD-10-CM

## 2012-10-07 DIAGNOSIS — Z79899 Other long term (current) drug therapy: Secondary | ICD-10-CM

## 2012-10-07 DIAGNOSIS — E7800 Pure hypercholesterolemia, unspecified: Secondary | ICD-10-CM

## 2012-10-07 MED ORDER — SPIRONOLACTONE 25 MG PO TABS: 25.0000 mg | ORAL_TABLET | Freq: Every day | ORAL | Status: DC

## 2012-10-07 NOTE — Progress Notes (Signed)
Evan Perez Date of Birth:  [birth year] [facility] [address] [place], [place]  [address] [phone]         Fax   [phone]  History of Present Illness: This pleasant 79 year old gentleman who is a resident of [facility] is seen for a scheduled followup visit. He has a past history of previous severe aortic stenosis. An 04/12/10 he underwent aortic valve replacement with a bovine pericardial tissue valve by Dr. Laneta Simmers. His cardiac catheterization preoperatively showed only minimal coronary artery disease and he did not require coronary artery bypass graft surgery. Postoperatively the patient had a lot of problems with fluid retention and congestive heart failure which gradually resolved. He has had a persistent cough and sinus congestion and is being followed by [facility]. He is not having any symptoms of ongoing congestive heart failure. He had a chest x-ray 05/13/12 which showed no change in chronic cardiomegaly and his lungs were clear and there was no evidence of congestive heart failure.  His last echocardiogram 06/20/10 showed mild left ventricular hypertrophy with ejection fraction 50-55% and the bioprosthetic aortic valve was functioning normally.  There was biatrial enlargement and there was mild to moderate tricuspid regurgitation.   Current Outpatient Prescriptions  Medication Sig Dispense Refill  . aspirin 81 MG tablet Take 81 mg by mouth daily. Take 3 x week      . azelastine (ASTELIN) 137 MCG/SPRAY nasal spray Place 1 spray into the nose 2 (two) times daily. Use in each nostril as directed      . cloNIDine (CATAPRES) 0.1 MG tablet Take 2 tablets (0.2 mg total) by mouth 2 (two) times daily.  120 tablet  5  . colesevelam (WELCHOL) 625 MG tablet Take 1,875 mg by mouth 2 (two) times daily with a meal.       . guaiFENesin (MUCINEX) 600 MG 12 hr tablet Take 1,200 mg by mouth 2 (two) times daily as needed. For congestion.        Marland Kitchen ipratropium (ATROVENT) 0.03 % nasal spray Place 2 sprays into the nose 2 (two) times daily.      Marland Kitchen levothyroxine (SYNTHROID, LEVOTHROID) 25 MCG tablet Take 1 tablet (25 mcg total) by mouth daily.  30 tablet  11  . loratadine (CLARITIN) 10 MG tablet Take 10 mg by mouth daily.      . mometasone (NASONEX) 50 MCG/ACT nasal spray Place 2 sprays into the nose daily.      . multivitamin (THERAGRAN) per tablet Take 1 tablet by mouth daily.       Marland Kitchen OVER THE COUNTER MEDICATION as needed. neilmed nasal wash      . valsartan-hydrochlorothiazide (DIOVAN HCT) 160-12.5 MG per tablet Take 1 tablet by mouth daily.  30 tablet  5  . warfarin (COUMADIN) 5 MG tablet Take 5 mg by mouth daily. As directed       No current facility-administered medications for this visit.    Allergies  Allergen Reactions  . Dust Mite Extract   . Lipitor (Atorvastatin Calcium)   . Ramipril   . Zocor (Simvastatin)     Patient Active Problem List   Diagnosis Date Noted  . Status post aortic valve replacement with bioprosthetic valve 01/15/2012    Priority: High  . Perennial allergic rhinitis 03/13/2011    Priority: High  . Drug-induced skin rash 09/12/2010    Priority: Medium  . HYPERTENSION 12/02/2006    Priority: Medium  . Atrial fibrillation 12/02/2006  Priority: Medium  . AORTIC STENOSIS, SEVERE 11/01/2009  . DYSPNEA 11/01/2009  . BACK PAIN 10/03/2009  . POLYMYALGIA RHEUMATICA 04/01/2009  . ERECTILE DYSFUNCTION, ORGANIC 10/26/2008  . URI 06/24/2008  . GOUT 03/10/2007  . HYPERLIPIDEMIA 12/02/2006  . BENIGN PROSTATIC HYPERTROPHY 12/02/2006    History  Smoking status  . Former Smoker  . Quit date: 06/04/1988  Smokeless tobacco  . Never Used    History  Alcohol Use  . Yes    Family History  Problem Relation Age of Onset  . Hypertension Mother   . Heart disease Mother   . Hypertension Father     Review of Systems: Constitutional: no fever chills diaphoresis or fatigue or change in weight.   Head and neck: no hearing loss, no epistaxis, no photophobia or visual disturbance. Respiratory: No cough, shortness of breath or wheezing. Cardiovascular: No chest pain peripheral edema, palpitations. Gastrointestinal: No abdominal distention, no abdominal pain, no change in bowel habits hematochezia or melena. Genitourinary: No dysuria, no frequency, no urgency, no nocturia. Musculoskeletal:No arthralgias, no back pain, no gait disturbance or myalgias. Neurological: No dizziness, no headaches, no numbness, no seizures, no syncope, no weakness, no tremors. Hematologic: No lymphadenopathy, no easy bruising. Psychiatric: No confusion, no hallucinations, no sleep disturbance.    Physical Exam: Filed Vitals:   10/07/12 1125  BP: 132/78  Pulse: 64   the general appearance reveals a well-developed well-nourished gentleman in no distress.The head and neck exam reveals pupils equal and reactive.  Extraocular movements are full.  There is no scleral icterus.  The mouth and pharynx are normal.  The neck is supple.  The carotids reveal no bruits.  The jugular venous pressure is normal.  The  thyroid is not enlarged.  There is no lymphadenopathy.  The chest is clear to percussion and auscultation.  There are no rales or rhonchi.  Expansion of the chest is symmetrical.  The precordium is quiet.  The pulse is irregularly irregular.  The first heart sound is normal.  The second heart sound is physiologically split.  There is no murmur gallop rub or click.  There is no abnormal lift or heave.  The abdomen is soft and nontender.  The bowel sounds are normal.  The liver and spleen are not enlarged.  There are no abdominal masses.  There are no abdominal bruits.  Extremities reveal good pedal pulses.  There is no phlebitis or edema.  There is no cyanosis or clubbing.  Strength is normal and symmetrical in all extremities.  There is no lateralizing weakness.  There are no sensory deficits.  The skin is warm and dry.   There is no rash.     Assessment / Plan:  Continue same medication.  Stop spironolactone altogether at this time.  Recheck in 4 months for office visit CBC lipid panel hepatic function panel and basal metabolic panel.

## 2012-10-07 NOTE — Assessment & Plan Note (Signed)
Blood pressure was remaining stable on current therapy 

## 2012-10-07 NOTE — Assessment & Plan Note (Signed)
The patient is no longer experiencing any symptoms of CHF.  He has been taking spironolactone only twice a week but it makes him dizzy and we will stop the spironolactone at this point.

## 2012-10-07 NOTE — Patient Instructions (Signed)
STOP YOUR SPIRONOLACTONE   Your physician recommends that you schedule a follow-up appointment in: 4 months with fasting labs (lp/bmet/hfp/cbc)

## 2012-10-07 NOTE — Assessment & Plan Note (Signed)
The patient has permanent atrial fibrillation and is on long-term Coumadin.  Is not having problems with the Coumadin

## 2012-10-24 ENCOUNTER — Telehealth: Payer: Self-pay | Admitting: Cardiology

## 2012-10-24 MED ORDER — WARFARIN SODIUM 5 MG PO TABS: 5.0000 mg | ORAL_TABLET | ORAL | Status: DC

## 2012-10-24 NOTE — Telephone Encounter (Signed)
New Prob     Refill Request [id number]

## 2012-11-05 ENCOUNTER — Ambulatory Visit (INDEPENDENT_AMBULATORY_CARE_PROVIDER_SITE_OTHER): Payer: Medicare Other | Admitting: Cardiology

## 2012-11-05 DIAGNOSIS — I4891 Unspecified atrial fibrillation: Secondary | ICD-10-CM

## 2012-11-05 DIAGNOSIS — I359 Nonrheumatic aortic valve disorder, unspecified: Secondary | ICD-10-CM

## 2012-12-03 ENCOUNTER — Telehealth: Payer: Self-pay | Admitting: Cardiology

## 2012-12-03 NOTE — Telephone Encounter (Signed)
Advised patient  Dr. Patty Sermons did not receive doppler, he will mail again

## 2012-12-03 NOTE — Telephone Encounter (Signed)
Follow Up    Following up on a report he mailed about a week ago. Please call.

## 2012-12-04 ENCOUNTER — Telehealth: Payer: Self-pay | Admitting: *Deleted

## 2012-12-04 DIAGNOSIS — R6889 Other general symptoms and signs: Secondary | ICD-10-CM

## 2012-12-04 NOTE — Telephone Encounter (Signed)
Patient had screening tests that showed low ABI' s and will arrange for lower extremity arterial dopplers. Advised patient and will send to [facility]

## 2012-12-08 ENCOUNTER — Encounter (INDEPENDENT_AMBULATORY_CARE_PROVIDER_SITE_OTHER): Payer: Medicare Other

## 2012-12-08 DIAGNOSIS — R6889 Other general symptoms and signs: Secondary | ICD-10-CM

## 2012-12-08 DIAGNOSIS — I723 Aneurysm of iliac artery: Secondary | ICD-10-CM

## 2012-12-08 DIAGNOSIS — I711 Thoracic aortic aneurysm, ruptured: Secondary | ICD-10-CM

## 2012-12-08 DIAGNOSIS — I739 Peripheral vascular disease, unspecified: Secondary | ICD-10-CM

## 2012-12-10 ENCOUNTER — Telehealth: Payer: Self-pay | Admitting: *Deleted

## 2012-12-10 DIAGNOSIS — I714 Abdominal aortic aneurysm, without rupture: Secondary | ICD-10-CM

## 2012-12-10 DIAGNOSIS — I723 Aneurysm of iliac artery: Secondary | ICD-10-CM

## 2012-12-10 NOTE — Telephone Encounter (Signed)
Order placed for consult

## 2012-12-10 NOTE — Telephone Encounter (Signed)
Message copied by Burnell Blanks on Wed Dec 10, 2012  2:05 PM ------      Message from: Cassell Clement      Created: Wed Dec 10, 2012  9:58 AM       I discussed findings with patient by phone yesterday.  We are arranging consultation with [facility]. ------

## 2012-12-11 NOTE — Telephone Encounter (Signed)
Appointment scheduled for next week.

## 2012-12-12 ENCOUNTER — Encounter: Payer: Self-pay | Admitting: Surgery

## 2012-12-15 ENCOUNTER — Encounter: Payer: Self-pay | Admitting: Surgery

## 2012-12-15 ENCOUNTER — Ambulatory Visit (INDEPENDENT_AMBULATORY_CARE_PROVIDER_SITE_OTHER): Payer: Medicare Other | Admitting: Surgery

## 2012-12-15 ENCOUNTER — Other Ambulatory Visit: Payer: Self-pay | Admitting: Surgery

## 2012-12-15 VITALS — BP 133/70 | HR 83 | Ht 70.0 in | Wt 170.0 lb

## 2012-12-15 DIAGNOSIS — I714 Abdominal aortic aneurysm, without rupture, unspecified: Secondary | ICD-10-CM | POA: Insufficient documentation

## 2012-12-15 NOTE — Progress Notes (Signed)
[facility]   Patient name: Evan Perez MRN: [id number] DOB: 09-Mar-1923 Sex: male   Referred by: Dr. Patty Sermons  Reason for referral:  Chief Complaint  Patient presents with  . New Evaluation    AAA - aneurysm common iliac artery     HISTORY OF PRESENT ILLNESS: This is a very pleasant 79 year old gentleman that I had seen for evaluation of an aortoiliac aneurysm. This was just detected on ultrasound. 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 The patient is without abdominal or back pain. He is very active.  The patient has history of hypertension which is treated medically. He also suffers from coronary artery disease. He is status post coronary artery bypass grafting in November of 2011. He had aortic valve replacement with a by a prosthetic valve. He is taking Coumadin for atrial fibrillation. He takes a statin 4 hypercholesterolemia.  Past Medical History  Diagnosis Date  . Aortic valve disorder   . AAA (abdominal aortic aneurysm)     CHRONIC AFIB  . Hypertension   . Fall     FRACTURE VERTEBRAL 2011  . CHF (congestive heart failure)     LV DYSFUNCTION -EF 55% JAN 2012  . Atrial fibrillation   . Cancer     Past Surgical History  Procedure Laterality Date  . Aortic valve replacement      NOV 2011-TISSUE VALVE, 23 mm Mitroflow aortic pericardial heart valve  . Cardiac catheterization      03/2010-non obstructive CAD; severe AS  . Vasectomy    . Tonsillectomy      History   Social History  . Marital Status: Widowed    Spouse Name: N/A    Number of Children: N/A  . Years of Education: N/A   Occupational History  . Not on file.   Social History Main Topics  . Smoking status: Former Smoker    Quit date: 06/04/1988  . Smokeless tobacco: Never Used  . Alcohol Use: 1.2 - 3 oz/week    1-3 Glasses of wine, 1-2 Shots of liquor per week  . Drug Use: No  . Sexually Active: Not on file   Other Topics Concern  . Not on file    Social History Narrative  . No narrative on file    Family History  Problem Relation Age of Onset  . Hypertension Mother   . Heart disease Mother   . Heart attack Mother   . Hypertension Father   . Heart disease Father   . Heart disease Brother     Allergies as of 12/15/2012 - Review Complete 12/15/2012  Allergen Reaction Noted  . Dust mite extract  05/14/2012  . Lipitor (atorvastatin calcium)  09/12/2010  . Ramipril  09/12/2010  . Zocor (simvastatin)  09/12/2010    Current Outpatient Prescriptions on File Prior to Visit  Medication Sig Dispense Refill  . aspirin 81 MG tablet Take 81 mg by mouth daily. Take 3 x week      . azelastine (ASTELIN) 137 MCG/SPRAY nasal spray Place 1 spray into the nose 2 (two) times daily. Use in each nostril as directed      . cloNIDine (CATAPRES) 0.1 MG tablet Take 2 tablets (0.2 mg total) by mouth 2 (two) times daily.  120 tablet  5  . colesevelam (WELCHOL) 625 MG tablet Take 1,875 mg by mouth 2 (two) times daily with a meal.       . guaiFENesin (MUCINEX) 600 MG 12 hr tablet  Take 1,200 mg by mouth 2 (two) times daily as needed. For congestion.      Marland Kitchen ipratropium (ATROVENT) 0.03 % nasal spray Place 2 sprays into the nose 2 (two) times daily.      Marland Kitchen levothyroxine (SYNTHROID, LEVOTHROID) 25 MCG tablet Take 1 tablet (25 mcg total) by mouth daily.  30 tablet  11  . loratadine (CLARITIN) 10 MG tablet Take 10 mg by mouth daily.      . mometasone (NASONEX) 50 MCG/ACT nasal spray Place 2 sprays into the nose daily.      . multivitamin (THERAGRAN) per tablet Take 1 tablet by mouth daily.       Marland Kitchen OVER THE COUNTER MEDICATION as needed. neilmed nasal wash      . valsartan-hydrochlorothiazide (DIOVAN HCT) 160-12.5 MG per tablet Take 1 tablet by mouth daily.  30 tablet  5  . warfarin (COUMADIN) 5 MG tablet Take 1 tablet (5 mg total) by mouth as directed.  40 tablet  3   No current facility-administered medications on file prior to visit.     REVIEW OF  SYSTEMS: Cardiovascular: No chest pain, chest pressure, palpitations, orthopnea. No claudication or rest pain,  No history of DVT or phlebitis. Positive for shortness of breath with exertion and atrial fibrillation Pulmonary: No productive cough, asthma or wheezing. Neurologic: No weakness, paresthesias, aphasia, or amaurosis. No dizziness. Hematologic: No bleeding problems or clotting disorders. Musculoskeletal: No joint pain or joint swelling. Gastrointestinal: No blood in stool or hematemesis Genitourinary: No dysuria or hematuria. Psychiatric:: No history of major depression. Integumentary: No rashes or ulcers. Constitutional: No fever or chills.  PHYSICAL EXAMINATION: General: The patient appears their stated age.  Vital signs are BP 133/70  Pulse 83  Ht 5\' 10"  (1.778 m)  Wt 170 lb (77.111 kg)  BMI 24.39 kg/m2  SpO2 96% HEENT:  No gross abnormalities Pulmonary: Respirations are non-labored Abdomen: Soft and non-tender.  Musculoskeletal: There are no major deformities.   Neurologic: No focal weakness or paresthesias are detected, Skin: There are no ulcer or rashes noted. Psychiatric: The patient has normal affect. Cardiovascular: There is a regular rate and rhythm without significant murmur appreciated. No carotid bruits. Palpable femoral pulses bilaterally  Diagnostic Studies: Patient has an outside ultrasound which shows an aortic and left iliac aneurysm    Assessment:  Aortic and left iliac aneurysm Plan: I discussed with the patient that in order to determine the exact dimensions of his aneurysmal disease as well as to determine the best options for repair, he will need to undergo CT angiography. I have scheduled this for 2 weeks from today. I will see him following his CAT scan. In addition I'm going to obtain carotid Doppler studies for preoperative evaluation.     Jorge Ny, M.D. [facility] [place] Office: [phone] Pager:   [phone]

## 2012-12-16 ENCOUNTER — Other Ambulatory Visit: Payer: Self-pay | Admitting: *Deleted

## 2012-12-16 DIAGNOSIS — I714 Abdominal aortic aneurysm, without rupture: Secondary | ICD-10-CM

## 2012-12-16 DIAGNOSIS — I739 Peripheral vascular disease, unspecified: Secondary | ICD-10-CM

## 2012-12-16 DIAGNOSIS — Z01810 Encounter for preprocedural cardiovascular examination: Secondary | ICD-10-CM

## 2012-12-17 ENCOUNTER — Ambulatory Visit (INDEPENDENT_AMBULATORY_CARE_PROVIDER_SITE_OTHER): Payer: Medicare Other | Admitting: *Deleted

## 2012-12-17 DIAGNOSIS — I359 Nonrheumatic aortic valve disorder, unspecified: Secondary | ICD-10-CM

## 2012-12-17 DIAGNOSIS — I4891 Unspecified atrial fibrillation: Secondary | ICD-10-CM

## 2012-12-25 ENCOUNTER — Other Ambulatory Visit: Payer: Self-pay | Admitting: Surgery

## 2012-12-25 LAB — BUN: BUN: 32 mg/dL — ABNORMAL HIGH (ref 6–23)

## 2012-12-26 ENCOUNTER — Encounter: Payer: Self-pay | Admitting: Surgery

## 2012-12-29 ENCOUNTER — Other Ambulatory Visit: Payer: Self-pay

## 2012-12-29 ENCOUNTER — Ambulatory Visit (INDEPENDENT_AMBULATORY_CARE_PROVIDER_SITE_OTHER): Payer: Medicare Other | Admitting: Surgery

## 2012-12-29 ENCOUNTER — Other Ambulatory Visit (INDEPENDENT_AMBULATORY_CARE_PROVIDER_SITE_OTHER): Payer: Medicare Other | Admitting: *Deleted

## 2012-12-29 ENCOUNTER — Ambulatory Visit
Admission: RE | Admit: 2012-12-29 | Discharge: 2012-12-29 | Disposition: A | Discharge status: Home / Self Care | Payer: Medicare Other | Source: Ambulatory Visit | Attending: Surgery | Admitting: Surgery

## 2012-12-29 ENCOUNTER — Other Ambulatory Visit: Payer: Self-pay | Admitting: *Deleted

## 2012-12-29 ENCOUNTER — Encounter: Payer: Self-pay | Admitting: Surgery

## 2012-12-29 ENCOUNTER — Other Ambulatory Visit: Payer: Self-pay | Admitting: Surgery

## 2012-12-29 VITALS — BP 157/78 | HR 82 | Resp 16 | Ht 70.0 in | Wt 170.0 lb

## 2012-12-29 DIAGNOSIS — I6529 Occlusion and stenosis of unspecified carotid artery: Secondary | ICD-10-CM

## 2012-12-29 DIAGNOSIS — Z01810 Encounter for preprocedural cardiovascular examination: Secondary | ICD-10-CM

## 2012-12-29 DIAGNOSIS — I714 Abdominal aortic aneurysm, without rupture: Secondary | ICD-10-CM

## 2012-12-29 DIAGNOSIS — I739 Peripheral vascular disease, unspecified: Secondary | ICD-10-CM

## 2012-12-29 MED ORDER — IOHEXOL 300 MG/ML  SOLN
60.0000 mL | Freq: Once | INTRAMUSCULAR | Status: AC | PRN
  Administered 2012-12-29: 60 mL via INTRAVENOUS

## 2012-12-29 NOTE — Progress Notes (Signed)
The patient is back today to discuss the CT scan findings for his left iliac aneurysm. He reports no interval change since I last saw him.  I have reviewed his CT scan which shows a markedly abnormal infrarenal abdominal aorta as well as bilateral iliac aneurysms.  I'm unsure as to whether or not he is going to be a candidate for endovascular repair. My concern is that he has multiple renal arteries was made make placing a stent graft challenging. I have recommended that we proceed with angiography to better define the location of his renal arteries and to see if there is enough length between the accessory renal arteries and his aortic bifurcation 2 and a main body of a stent graft. If I feel that he is a candidate for endovascular repair I would embolize his left hypogastric artery in anticipation of coverage into the left external iliac artery for exclusion of his left common iliac artery aneurysm.  The patient is on Coumadin for an aortic valve. This will need to be discontinued 5 days prior. I'll discuss with Dr. Patty Sermons the need for a Lovenox bridge. I have scheduled his arteriogram for Tuesday, August 5. I spent 45 minutes discussing this with the patient

## 2012-12-30 MED ORDER — FAMOTIDINE IN NACL 20-0.9 MG/50ML-% IV SOLN
INTRAVENOUS | Status: AC
  Filled 2012-12-30: qty 50

## 2013-01-01 ENCOUNTER — Telehealth: Payer: Self-pay | Admitting: *Deleted

## 2013-01-01 ENCOUNTER — Encounter (HOSPITAL_COMMUNITY): Payer: Self-pay | Admitting: Pharmacy Technician

## 2013-01-01 ENCOUNTER — Telehealth: Payer: Self-pay

## 2013-01-01 NOTE — Telephone Encounter (Signed)
Notified pt. Regarding need for Lovenox bridge; and to expect phone call from the Coumadin Clinic.  Answered questions about his upcoming procedure.  Pt. Verb. Understanding.

## 2013-01-01 NOTE — Telephone Encounter (Signed)
Spoke with pt and explained his need for Lovenox per Dr Patty Sermons. He did take his last coumadin dose yesterday. He has never done these injections, thus appt scheduled for CVRR tomorrow. Lovenox 80mg s injections SQ every 24hrs #10 with 1 refill called into [facility] at [facility] at [phone].  Pt's weight is 77Kg and his CrCl is 34.75 .

## 2013-01-01 NOTE — Telephone Encounter (Signed)
Message copied by Phillips Odor on Thu Jan 01, 2013  9:08 AM ------      Message from: Regis Bill B      Created: Wed Dec 31, 2012  6:04 PM      Regarding: FW: need for Lovenox bridge?       Spoke with  Dr. Patty Sermons and he would like Lovenox bridging. Will forward to Coumadin clinic.             ----- Message -----         From: Erenest Blank, RN         Sent: 12/29/2012   4:32 PM           To: Burnell Blanks      Subject: need for Lovenox bridge?                                 Pt. is scheduled for Abdominal Aortogram with possible coil embolization of Left Hypogastric Artery on 01/06/13; he will need to hold Coumadin x 5 days, prior.  Instructed to take last dose of Coumadin on 12/31/12.  Does pt. Need Lovenox bridge?  If so, can you order this for pt?       ------

## 2013-01-01 NOTE — Telephone Encounter (Signed)
Message copied by MUSE, Franchot Mimes on Thu Jan 01, 2013  9:50 AM ------      Message from: Regis Bill B      Created: Wed Dec 31, 2012  6:04 PM      Regarding: FW: need for Lovenox bridge?       Spoke with  Dr. Patty Sermons and he would like Lovenox bridging. Will forward to Coumadin clinic.             ----- Message -----         From: Erenest Blank, RN         Sent: 12/29/2012   4:32 PM           To: Burnell Blanks      Subject: need for Lovenox bridge?                                 Pt. is scheduled for Abdominal Aortogram with possible coil embolization of Left Hypogastric Artery on 01/06/13; he will need to hold Coumadin x 5 days, prior.  Instructed to take last dose of Coumadin on 12/31/12.  Does pt. Need Lovenox bridge?  If so, can you order this for pt?       ------

## 2013-01-02 ENCOUNTER — Ambulatory Visit (INDEPENDENT_AMBULATORY_CARE_PROVIDER_SITE_OTHER): Payer: Medicare Other | Admitting: *Deleted

## 2013-01-02 DIAGNOSIS — I359 Nonrheumatic aortic valve disorder, unspecified: Secondary | ICD-10-CM

## 2013-01-02 DIAGNOSIS — I4891 Unspecified atrial fibrillation: Secondary | ICD-10-CM

## 2013-01-02 LAB — POCT INR: INR: 2

## 2013-01-02 NOTE — Patient Instructions (Addendum)
lovenox 80 mg daily up until day of procedure, no Lovenox day of procedure, after procedure take Lovenox and coumadin both, when you start your coumadin back take an extra 1/2 tablet for 2 days then resume prior dose, calendar provided to patient with instructions   Patient injected Lovenox 80 mg subcutaneous to right side abdomen, followed procedure instructions well.

## 2013-01-05 ENCOUNTER — Telehealth: Payer: Self-pay | Admitting: Cardiology

## 2013-01-05 ENCOUNTER — Ambulatory Visit (INDEPENDENT_AMBULATORY_CARE_PROVIDER_SITE_OTHER): Payer: Medicare Other | Admitting: Cardiology

## 2013-01-05 NOTE — Progress Notes (Signed)
Patient here c/o rash on abdomen since starting Lovenox, he noticed a little bit yesterday and more pronounced today, nurse noted rash on abdomen, legs, heavy rash on back, lightly on face, no other symptoms including shortness of breath or itching. I spoke with Dr Patty Sermons whom instructed for pt to stop Lovenox (today was last dose due to procedure tomorrow) and not take any Lovenox post procedure, I have instructed patient of this, I added to allergy list on profile, I instructed patient to get some benadryl po and take as directed. He has directions already for extra coumadin dosing post procedure for 2 days, I reviewed this with him, he also has f/u appt with coumadin clinic. I sent Dr Myra Gianotti this information by basket.

## 2013-01-05 NOTE — Telephone Encounter (Signed)
New Prob     Pt would like to speak to someone in coumadin clinic regarding his LOVENOX shot. Please call.

## 2013-01-05 NOTE — Addendum Note (Signed)
Addended by: Carmela Hurt on: 01/05/2013 03:54 PM   Modules accepted: Level of Service

## 2013-01-05 NOTE — Telephone Encounter (Signed)
Patient seen in coumadin clinic

## 2013-01-06 ENCOUNTER — Encounter (HOSPITAL_COMMUNITY)
Admission: RE | Disposition: A | Discharge status: Home / Self Care | Payer: Self-pay | Source: Ambulatory Visit | Attending: Surgery

## 2013-01-06 ENCOUNTER — Ambulatory Visit (HOSPITAL_COMMUNITY)
Admission: RE | Admit: 2013-01-06 | Discharge: 2013-01-06 | Disposition: A | Discharge status: Home / Self Care | Payer: Medicare Other | Source: Ambulatory Visit | Attending: Surgery | Admitting: Surgery

## 2013-01-06 ENCOUNTER — Telehealth: Payer: Self-pay | Admitting: *Deleted

## 2013-01-06 DIAGNOSIS — I701 Atherosclerosis of renal artery: Secondary | ICD-10-CM | POA: Insufficient documentation

## 2013-01-06 DIAGNOSIS — E7800 Pure hypercholesterolemia, unspecified: Secondary | ICD-10-CM | POA: Insufficient documentation

## 2013-01-06 DIAGNOSIS — I2510 Atherosclerotic heart disease of native coronary artery without angina pectoris: Secondary | ICD-10-CM | POA: Insufficient documentation

## 2013-01-06 DIAGNOSIS — Z79899 Other long term (current) drug therapy: Secondary | ICD-10-CM | POA: Insufficient documentation

## 2013-01-06 DIAGNOSIS — Z87891 Personal history of nicotine dependence: Secondary | ICD-10-CM | POA: Insufficient documentation

## 2013-01-06 DIAGNOSIS — Z951 Presence of aortocoronary bypass graft: Secondary | ICD-10-CM | POA: Insufficient documentation

## 2013-01-06 DIAGNOSIS — Z7982 Long term (current) use of aspirin: Secondary | ICD-10-CM | POA: Insufficient documentation

## 2013-01-06 DIAGNOSIS — Z9181 History of falling: Secondary | ICD-10-CM | POA: Insufficient documentation

## 2013-01-06 DIAGNOSIS — I10 Essential (primary) hypertension: Secondary | ICD-10-CM | POA: Insufficient documentation

## 2013-01-06 DIAGNOSIS — I714 Abdominal aortic aneurysm, without rupture, unspecified: Secondary | ICD-10-CM | POA: Insufficient documentation

## 2013-01-06 DIAGNOSIS — I4891 Unspecified atrial fibrillation: Secondary | ICD-10-CM | POA: Insufficient documentation

## 2013-01-06 DIAGNOSIS — Z954 Presence of other heart-valve replacement: Secondary | ICD-10-CM | POA: Insufficient documentation

## 2013-01-06 DIAGNOSIS — I723 Aneurysm of iliac artery: Secondary | ICD-10-CM

## 2013-01-06 DIAGNOSIS — Z7901 Long term (current) use of anticoagulants: Secondary | ICD-10-CM | POA: Insufficient documentation

## 2013-01-06 DIAGNOSIS — I7789 Other specified disorders of arteries and arterioles: Secondary | ICD-10-CM | POA: Insufficient documentation

## 2013-01-06 HISTORY — PX: ABDOMINAL AORTAGRAM: SHX5454

## 2013-01-06 LAB — POCT I-STAT, CHEM 8
BUN: 29 mg/dL — ABNORMAL HIGH (ref 6–23)
Calcium, Ion: 1.12 mmol/L — ABNORMAL LOW (ref 1.13–1.30)
Creatinine, Ser: 2.1 mg/dL — ABNORMAL HIGH (ref 0.50–1.35)
TCO2: 29 mmol/L (ref 0–100)

## 2013-01-06 LAB — PROTIME-INR: INR: 1.1 (ref 0.00–1.49)

## 2013-01-06 LAB — POCT ACTIVATED CLOTTING TIME: Activated Clotting Time: 196 seconds

## 2013-01-06 SURGERY — ABDOMINAL AORTAGRAM
Anesthesia: LOCAL

## 2013-01-06 MED ORDER — HYDRALAZINE HCL 20 MG/ML IJ SOLN
INTRAMUSCULAR | Status: AC
  Filled 2013-01-06: qty 1

## 2013-01-06 MED ORDER — PHENOL 1.4 % MT LIQD: 1.0000 | OROMUCOSAL | Status: DC | PRN

## 2013-01-06 MED ORDER — METOPROLOL TARTRATE 1 MG/ML IV SOLN: 2.0000 mg | INTRAVENOUS | Status: DC | PRN

## 2013-01-06 MED ORDER — ACETAMINOPHEN 325 MG RE SUPP: 325.0000 mg | RECTAL | Status: DC | PRN

## 2013-01-06 MED ORDER — FENTANYL CITRATE 0.05 MG/ML IJ SOLN
INTRAMUSCULAR | Status: AC
  Filled 2013-01-06: qty 2

## 2013-01-06 MED ORDER — DIPHENHYDRAMINE HCL 50 MG/ML IJ SOLN
INTRAMUSCULAR | Status: AC
  Filled 2013-01-06: qty 1

## 2013-01-06 MED ORDER — TRAMADOL HCL 50 MG PO TABS: 50.0000 mg | ORAL_TABLET | Freq: Four times a day (QID) | ORAL | Status: DC | PRN

## 2013-01-06 MED ORDER — METHYLPREDNISOLONE SODIUM SUCC 125 MG IJ SOLR
INTRAMUSCULAR | Status: AC
  Filled 2013-01-06: qty 2

## 2013-01-06 MED ORDER — LIDOCAINE HCL (PF) 1 % IJ SOLN
INTRAMUSCULAR | Status: AC
  Filled 2013-01-06: qty 30

## 2013-01-06 MED ORDER — LABETALOL HCL 5 MG/ML IV SOLN: 10.0000 mg | INTRAVENOUS | Status: DC | PRN

## 2013-01-06 MED ORDER — FAMOTIDINE IN NACL 20-0.9 MG/50ML-% IV SOLN
INTRAVENOUS | Status: AC
  Filled 2013-01-06: qty 50

## 2013-01-06 MED ORDER — MIDAZOLAM HCL 2 MG/2ML IJ SOLN
INTRAMUSCULAR | Status: AC
  Filled 2013-01-06: qty 2

## 2013-01-06 MED ORDER — HEPARIN (PORCINE) IN NACL 2-0.9 UNIT/ML-% IJ SOLN
INTRAMUSCULAR | Status: AC
  Filled 2013-01-06: qty 1000

## 2013-01-06 MED ORDER — MORPHINE SULFATE 10 MG/ML IJ SOLN: 2.0000 mg | INTRAMUSCULAR | Status: DC | PRN

## 2013-01-06 MED ORDER — ONDANSETRON HCL 4 MG/2ML IJ SOLN: 4.0000 mg | Freq: Four times a day (QID) | INTRAMUSCULAR | Status: DC | PRN

## 2013-01-06 MED ORDER — HYDRALAZINE HCL 20 MG/ML IJ SOLN: 10.0000 mg | INTRAMUSCULAR | Status: DC | PRN

## 2013-01-06 MED ORDER — ALUM & MAG HYDROXIDE-SIMETH 200-200-20 MG/5ML PO SUSP: 15.0000 mL | ORAL | Status: DC | PRN

## 2013-01-06 MED ORDER — SODIUM CHLORIDE 0.9 % IV SOLN: 1.0000 mL/kg/h | INTRAVENOUS | Status: DC

## 2013-01-06 MED ORDER — SODIUM CHLORIDE 0.9 % IV SOLN
INTRAVENOUS | Status: DC
  Administered 2013-01-06: 09:00:00 via INTRAVENOUS

## 2013-01-06 MED ORDER — GUAIFENESIN-DM 100-10 MG/5ML PO SYRP: 15.0000 mL | ORAL_SOLUTION | ORAL | Status: DC | PRN

## 2013-01-06 MED ORDER — ACETAMINOPHEN 325 MG PO TABS: 325.0000 mg | ORAL_TABLET | ORAL | Status: DC | PRN

## 2013-01-06 NOTE — H&P (View-Only) (Signed)
The patient is back today to discuss the CT scan findings for his left iliac aneurysm. He reports no interval change since I last saw him.  I have reviewed his CT scan which shows a markedly abnormal infrarenal abdominal aorta as well as bilateral iliac aneurysms.  I'm unsure as to whether or not he is going to be a candidate for endovascular repair. My concern is that he has multiple renal arteries was made make placing a stent graft challenging. I have recommended that we proceed with angiography to better define the location of his renal arteries and to see if there is enough length between the accessory renal arteries and his aortic bifurcation 2 and a main body of a stent graft. If I feel that he is a candidate for endovascular repair I would embolize his left hypogastric artery in anticipation of coverage into the left external iliac artery for exclusion of his left common iliac artery aneurysm.  The patient is on Coumadin for an aortic valve. This will need to be discontinued 5 days prior. I'll discuss with Dr. Brackbill the need for a Lovenox bridge. I have scheduled his arteriogram for Tuesday, August 5. I spent 45 minutes discussing this with the patient 

## 2013-01-06 NOTE — Telephone Encounter (Signed)
Message copied by Carmela Hurt on Tue Jan 06, 2013 11:14 AM ------      Message from: Nada Libman      Created: Mon Jan 05, 2013  9:32 PM      Regarding: RE: rash       thanks      ----- Message -----         From: Carmela Hurt, RN         Sent: 01/05/2013   3:38 PM           To: Nada Libman, MD      Subject: rash                                                     Patient had reaction to lovenox injection, took last injection today will not taken any after procedure per Dr Patty Sermons, just an FYI.            Thanks, coumadin clinic       ------

## 2013-01-06 NOTE — Op Note (Signed)
[facility]  Patient name: Evan Perez MRN: [id number] DOB: Nov 01, 1922 Sex: male  01/06/2013 Pre-operative Diagnosis: Abdominal and iliac aneurysm Post-operative diagnosis:  Same Surgeon:  Jorge Ny Procedure Performed:  1.  ultrasound-guided access, right femoral artery  2.  abdominal gram  3.  second order catheterization (left hypogastric artery)  4.  pelvic angiogram  5.  coil embolization, left hypogastric artery   Indications:  The patient was found to have a large left common iliac aneurysm. In addition he has ulcers throughout his abdominal aorta with maximum diameter of 4 cm. He comes in for further evaluation to see if he is a candidate for stent graft, and possible embolization of his left hypogastric artery  Procedure:  The patient was identified in the holding area and taken to room 8.  The patient was then placed supine on the table and prepped and draped in the usual sterile fashion.  A time out was called.  Ultrasound was used to evaluate the right common femoral artery.  It was patent .  A digital ultrasound image was acquired.  A micropuncture needle was used to access the right common femoral artery under ultrasound guidance.  An 018 wire was advanced without resistance and a micropuncture sheath was placed.  The 018 wire was removed and a benson wire was placed.  The micropuncture sheath was exchanged for a 5 french sheath. A marked pigtail catheter was advanced to L1 and an abdominal aortogram was performed.  Findings:   Aortogram:  The suprarenal aorta is patent. Aneurysmal/ulcer disease is present around the juxtarenal aorta. There is stenosis within the left main renal artery. The right renal artery is patent. There is a large accessory renal on the right which originates below the largest ulceration. This perfuses approximately 2/3 of the right kidney. The remaining portion of the aorta shows ulcer/ectatic disease. The right  common iliac artery is patent throughout it's course the right external and hypogastric arteries are widely patent. Aneurysmal changes are seen within the left common iliac artery. The left external and hypogastric arteries are widely patent.   Intervention:  After the above images were obtained, the decision was made to proceed with embolization. A Omni flush catheter was used to gain access into the left external iliac artery. A 6 French Ansel 1 sheath was then placed in the left external iliac artery. The patient was given 5000 units of heparin. The cancellous sheath was then withdrawn into the common iliac artery, and using a KM P. catheter and a Benson wire, the left hypogastric artery was selected. Contrast injections were performed to confirm correct placement. The KM P. catheter was removed the dilator for the sheath was inserted and the Ansel 1 sheath was advanced into the proximal left hypogastric artery. Over the Bentson wire the Kumpe catheter was placed back into the hypogastric artery. I then proceeded with coil embolization of the hypogastric artery. I initially placed an 8 mm nester coil followed by 1010 mm nester coils followed by an 8 mm nester coil. Final imaging revealed successful embolization of the hypogastric artery on the left. Catheters and wires were removed. The patient's sheath was withdrawn to the right iliac system. We taken the holding area for sheath pull.  Impression:  #1  successful coil embolization of the left hypogastric artery  #2  ulcer disease with aneurysmal changes within the aorta and left common iliac artery.  #3  left renal artery stenosis.  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 Durene Cal, M.D. [facility] [place] Office: [phone] Pager:  [phone]

## 2013-01-06 NOTE — H&P (View-Only) (Signed)
[facility] [place]   Patient name: Evan Perez MRN: [id number] DOB: 06/21/1922 Sex: male   Referred by: Dr. Brackbill  Reason for referral:  Chief Complaint  Patient presents with  . New Evaluation    AAA - aneurysm common iliac artery     HISTORY OF PRESENT ILLNESS: This is a very pleasant 79-year-old gentleman that I had seen for evaluation of an aortoiliac aneurysm. This was just detected on ultrasound. 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 The patient is without abdominal or back pain. He is very active.  The patient has history of hypertension which is treated medically. He also suffers from coronary artery disease. He is status post coronary artery bypass grafting in November of 2011. He had aortic valve replacement with a by a prosthetic valve. He is taking Coumadin for atrial fibrillation. He takes a statin 4 hypercholesterolemia.  Past Medical History  Diagnosis Date  . Aortic valve disorder   . AAA (abdominal aortic aneurysm)     CHRONIC AFIB  . Hypertension   . Fall     FRACTURE VERTEBRAL 2011  . CHF (congestive heart failure)     LV DYSFUNCTION -EF 55% JAN 2012  . Atrial fibrillation   . Cancer     Past Surgical History  Procedure Laterality Date  . Aortic valve replacement      NOV 2011-TISSUE VALVE, 23 mm Mitroflow aortic pericardial heart valve  . Cardiac catheterization      03/2010-non obstructive CAD; severe AS  . Vasectomy    . Tonsillectomy      History   Social History  . Marital Status: Widowed    Spouse Name: N/A    Number of Children: N/A  . Years of Education: N/A   Occupational History  . Not on file.   Social History Main Topics  . Smoking status: Former Smoker    Quit date: 06/04/1988  . Smokeless tobacco: Never Used  . Alcohol Use: 1.2 - 3 oz/week    1-3 Glasses of wine, 1-2 Shots of liquor per week  . Drug Use: No  . Sexually Active: Not on file   Other Topics Concern  . Not on file    Social History Narrative  . No narrative on file    Family History  Problem Relation Age of Onset  . Hypertension Mother   . Heart disease Mother   . Heart attack Mother   . Hypertension Father   . Heart disease Father   . Heart disease Brother     Allergies as of 12/15/2012 - Review Complete 12/15/2012  Allergen Reaction Noted  . Dust mite extract  05/14/2012  . Lipitor (atorvastatin calcium)  09/12/2010  . Ramipril  09/12/2010  . Zocor (simvastatin)  09/12/2010    Current Outpatient Prescriptions on File Prior to Visit  Medication Sig Dispense Refill  . aspirin 81 MG tablet Take 81 mg by mouth daily. Take 3 x week      . azelastine (ASTELIN) 137 MCG/SPRAY nasal spray Place 1 spray into the nose 2 (two) times daily. Use in each nostril as directed      . cloNIDine (CATAPRES) 0.1 MG tablet Take 2 tablets (0.2 mg total) by mouth 2 (two) times daily.  120 tablet  5  . colesevelam (WELCHOL) 625 MG tablet Take 1,875 mg by mouth 2 (two) times daily with a meal.       . guaiFENesin (MUCINEX) 600 MG 12 hr tablet   Take 1,200 mg by mouth 2 (two) times daily as needed. For congestion.      . ipratropium (ATROVENT) 0.03 % nasal spray Place 2 sprays into the nose 2 (two) times daily.      . levothyroxine (SYNTHROID, LEVOTHROID) 25 MCG tablet Take 1 tablet (25 mcg total) by mouth daily.  30 tablet  11  . loratadine (CLARITIN) 10 MG tablet Take 10 mg by mouth daily.      . mometasone (NASONEX) 50 MCG/ACT nasal spray Place 2 sprays into the nose daily.      . multivitamin (THERAGRAN) per tablet Take 1 tablet by mouth daily.       . OVER THE COUNTER MEDICATION as needed. neilmed nasal wash      . valsartan-hydrochlorothiazide (DIOVAN HCT) 160-12.5 MG per tablet Take 1 tablet by mouth daily.  30 tablet  5  . warfarin (COUMADIN) 5 MG tablet Take 1 tablet (5 mg total) by mouth as directed.  40 tablet  3   No current facility-administered medications on file prior to visit.     REVIEW OF  SYSTEMS: Cardiovascular: No chest pain, chest pressure, palpitations, orthopnea. No claudication or rest pain,  No history of DVT or phlebitis. Positive for shortness of breath with exertion and atrial fibrillation Pulmonary: No productive cough, asthma or wheezing. Neurologic: No weakness, paresthesias, aphasia, or amaurosis. No dizziness. Hematologic: No bleeding problems or clotting disorders. Musculoskeletal: No joint pain or joint swelling. Gastrointestinal: No blood in stool or hematemesis Genitourinary: No dysuria or hematuria. Psychiatric:: No history of major depression. Integumentary: No rashes or ulcers. Constitutional: No fever or chills.  PHYSICAL EXAMINATION: General: The patient appears their stated age.  Vital signs are BP 133/70  Pulse 83  Ht 5' 10" (1.778 m)  Wt 170 lb (77.111 kg)  BMI 24.39 kg/m2  SpO2 96% HEENT:  No gross abnormalities Pulmonary: Respirations are non-labored Abdomen: Soft and non-tender.  Musculoskeletal: There are no major deformities.   Neurologic: No focal weakness or paresthesias are detected, Skin: There are no ulcer or rashes noted. Psychiatric: The patient has normal affect. Cardiovascular: There is a regular rate and rhythm without significant murmur appreciated. No carotid bruits. Palpable femoral pulses bilaterally  Diagnostic Studies: Patient has an outside ultrasound which shows an aortic and left iliac aneurysm    Assessment:  Aortic and left iliac aneurysm Plan: I discussed with the patient that in order to determine the exact dimensions of his aneurysmal disease as well as to determine the best options for repair, he will need to undergo CT angiography. I have scheduled this for 2 weeks from today. I will see him following his CAT scan. In addition I'm going to obtain carotid Doppler studies for preoperative evaluation.     V. Wells Jesten Cappuccio IV, M.D. [facility] [place] Office: [phone] Pager:   [phone]   

## 2013-01-06 NOTE — Interval H&P Note (Signed)
History and Physical Interval Note:  01/06/2013 11:00 AM  Evan Perez  has presented today for surgery, with the diagnosis of Aneurysm  The various methods of treatment have been discussed with the patient and family. After consideration of risks, benefits and other options for treatment, the patient has consented to  Procedure(s): ABDOMINAL AORTAGRAM (N/A) as a surgical intervention .  The patient's history has been reviewed, patient examined, no change in status, stable for surgery.  I have reviewed the patient's chart and labs.  Questions were answered to the patient's satisfaction.     Selia Wareing IV, V. WELLS

## 2013-01-06 NOTE — Interval H&P Note (Signed)
History and Physical Interval Note:  01/06/2013 11:01 AM  Evan Perez  has presented today for surgery, with the diagnosis of Aneurysm  The various methods of treatment have been discussed with the patient and family. After consideration of risks, benefits and other options for treatment, the patient has consented to  Procedure(s): ABDOMINAL AORTAGRAM (N/A) as a surgical intervention .  The patient's history has been reviewed, patient examined, no change in status, stable for surgery.  I have reviewed the patient's chart and labs.  Questions were answered to the patient's satisfaction.     Dickson Kostelnik IV, V. WELLS

## 2013-01-08 ENCOUNTER — Telehealth: Payer: Self-pay

## 2013-01-08 ENCOUNTER — Other Ambulatory Visit: Payer: Self-pay

## 2013-01-08 ENCOUNTER — Telehealth: Payer: Self-pay | Admitting: Surgery

## 2013-01-08 NOTE — Telephone Encounter (Signed)
LVM re appt info, sent letter - kf °

## 2013-01-08 NOTE — Telephone Encounter (Signed)
Pt. Called the office on 01/07/13 afternoon, to state he "had a procedure on 8/5, and wasn't feeling very good."  Returned call to pt. approx. 2:20 PM 8/6 to triage symptoms.  At that time pt. stated he had eaten an egg and piece of toast and was feeling a little better.  Resides at an assisted living facility and reported he had been to the nurse and had been checked-out; reported T. 97.5, pulse 93, BP 130/60, and O2 Sat. 98%.  Questioned pt. about the right groin access site; states has "bruising slightly above the right groin, and bruising in the genital region".  Denies any swelling within the bruised areas.  States area of bruising is "soft".   Explained that with the anticoagulation, pt. More susceptible to bruising.  Advised pt. to continue to monitor these areas of bruising and report any change in firmness or swelling.  Also advised to stay hydrated.  Verb. Understanidng.  Pt. had multiple questions about the next step in treating his AAA.  Requested to see Dr. Myra Gianotti again in office to discuss this procedure.  Advised will schedule pt. Appt. To see Dr. Myra Gianotti to have questions answered.

## 2013-01-12 ENCOUNTER — Ambulatory Visit (INDEPENDENT_AMBULATORY_CARE_PROVIDER_SITE_OTHER): Payer: Medicare Other | Admitting: Pharmacist

## 2013-01-12 DIAGNOSIS — Z953 Presence of xenogenic heart valve: Secondary | ICD-10-CM

## 2013-01-12 DIAGNOSIS — I4891 Unspecified atrial fibrillation: Secondary | ICD-10-CM

## 2013-01-12 DIAGNOSIS — Z952 Presence of prosthetic heart valve: Secondary | ICD-10-CM

## 2013-01-12 DIAGNOSIS — I359 Nonrheumatic aortic valve disorder, unspecified: Secondary | ICD-10-CM

## 2013-01-12 LAB — POCT INR: INR: 1.4

## 2013-01-13 ENCOUNTER — Telehealth: Payer: Self-pay | Admitting: *Deleted

## 2013-01-13 DIAGNOSIS — R609 Edema, unspecified: Secondary | ICD-10-CM

## 2013-01-13 NOTE — Telephone Encounter (Signed)
Will forward to  Dr. Brackbill for review 

## 2013-01-13 NOTE — Telephone Encounter (Signed)
Message copied by Burnell Blanks on Tue Jan 13, 2013  6:01 PM ------      Message from: Phillips Odor      Created: Thu Jan 08, 2013  3:42 PM      Regarding: anticoagulation recommendation       This pt. recently had a coil embolization of left hypogastric artery.  He was bridged with Lovenox, and had a severe reaction to the Lovenox.  We have scheduled him for an Endovascular Stent Graft Repair of AAA on 01/29/13.  He will need to stop his Coumadin at least 5 days prior to procedure on 8/28.  Please check with Dr. Patty Sermons about anticoagulation recommendations, since pt. reacted to Lovenox.  (please reply to Oswaldo Done, RN, as I will be out of office starting 1:00 PM on 8/8 for 1 week.)  ------

## 2013-01-14 NOTE — Telephone Encounter (Signed)
Spoke with Dr. Patty Sermons.  Since pt had allergic reaction to Lovenox, will bridge with Xarelto.  Made pt appt for 8/18 to discuss instructions.

## 2013-01-19 ENCOUNTER — Encounter: Payer: Self-pay | Admitting: Surgery

## 2013-01-19 ENCOUNTER — Ambulatory Visit (INDEPENDENT_AMBULATORY_CARE_PROVIDER_SITE_OTHER): Payer: Medicare Other | Admitting: Pharmacist

## 2013-01-19 DIAGNOSIS — I359 Nonrheumatic aortic valve disorder, unspecified: Secondary | ICD-10-CM

## 2013-01-19 DIAGNOSIS — Z952 Presence of prosthetic heart valve: Secondary | ICD-10-CM

## 2013-01-19 DIAGNOSIS — I4891 Unspecified atrial fibrillation: Secondary | ICD-10-CM

## 2013-01-19 DIAGNOSIS — Z953 Presence of xenogenic heart valve: Secondary | ICD-10-CM

## 2013-01-19 LAB — POCT INR: INR: 1.7

## 2013-01-19 NOTE — Patient Instructions (Signed)
8/23- Last dose of Coumadin 8/24- No Coumadin or Xarelto 8/25- Xarelto 15mg  once daily with food 8/26- Xarelto 15mg  once daily with food  8/27- Xarelto 15mg  once daily with food  8/28- Day of procedure.  Do not take any blood thinners.  We will follow up with you once you are discharged from the hospital.

## 2013-01-22 ENCOUNTER — Encounter (HOSPITAL_COMMUNITY): Payer: Self-pay | Admitting: Pharmacy Technician

## 2013-01-23 ENCOUNTER — Encounter: Payer: Self-pay | Admitting: Surgery

## 2013-01-26 ENCOUNTER — Encounter (HOSPITAL_COMMUNITY): Payer: Self-pay | Admitting: Physician Assistant

## 2013-01-26 ENCOUNTER — Inpatient Hospital Stay (HOSPITAL_COMMUNITY): Admission: RE | Admit: 2013-01-26 | Payer: Medicare Other | Source: Ambulatory Visit

## 2013-01-26 ENCOUNTER — Ambulatory Visit (INDEPENDENT_AMBULATORY_CARE_PROVIDER_SITE_OTHER): Payer: Medicare Other | Admitting: Surgery

## 2013-01-26 ENCOUNTER — Inpatient Hospital Stay (HOSPITAL_COMMUNITY): Payer: Medicare Other

## 2013-01-26 ENCOUNTER — Inpatient Hospital Stay (HOSPITAL_COMMUNITY)
Admission: AD | Admit: 2013-01-26 | Discharge: 2013-02-06 | DRG: 237 | Disposition: A | Discharge status: Home / Self Care | Payer: Medicare Other | Source: Ambulatory Visit | Attending: Surgery | Admitting: Surgery

## 2013-01-26 ENCOUNTER — Encounter: Payer: Self-pay | Admitting: Surgery

## 2013-01-26 ENCOUNTER — Telehealth: Payer: Self-pay | Admitting: *Deleted

## 2013-01-26 VITALS — BP 146/63 | HR 84 | Ht 70.0 in | Wt 170.0 lb

## 2013-01-26 DIAGNOSIS — IMO0002 Reserved for concepts with insufficient information to code with codable children: Secondary | ICD-10-CM | POA: Diagnosis not present

## 2013-01-26 DIAGNOSIS — Z79899 Other long term (current) drug therapy: Secondary | ICD-10-CM

## 2013-01-26 DIAGNOSIS — I5031 Acute diastolic (congestive) heart failure: Secondary | ICD-10-CM | POA: Diagnosis not present

## 2013-01-26 DIAGNOSIS — I10 Essential (primary) hypertension: Secondary | ICD-10-CM

## 2013-01-26 DIAGNOSIS — Z951 Presence of aortocoronary bypass graft: Secondary | ICD-10-CM

## 2013-01-26 DIAGNOSIS — I723 Aneurysm of iliac artery: Secondary | ICD-10-CM | POA: Diagnosis present

## 2013-01-26 DIAGNOSIS — I714 Abdominal aortic aneurysm, without rupture, unspecified: Principal | ICD-10-CM | POA: Diagnosis present

## 2013-01-26 DIAGNOSIS — E871 Hypo-osmolality and hyponatremia: Secondary | ICD-10-CM | POA: Diagnosis not present

## 2013-01-26 DIAGNOSIS — N179 Acute kidney failure, unspecified: Secondary | ICD-10-CM | POA: Diagnosis not present

## 2013-01-26 DIAGNOSIS — Z952 Presence of prosthetic heart valve: Secondary | ICD-10-CM

## 2013-01-26 DIAGNOSIS — Z87891 Personal history of nicotine dependence: Secondary | ICD-10-CM

## 2013-01-26 DIAGNOSIS — Z7901 Long term (current) use of anticoagulants: Secondary | ICD-10-CM

## 2013-01-26 DIAGNOSIS — I129 Hypertensive chronic kidney disease with stage 1 through stage 4 chronic kidney disease, or unspecified chronic kidney disease: Secondary | ICD-10-CM | POA: Diagnosis present

## 2013-01-26 DIAGNOSIS — I4891 Unspecified atrial fibrillation: Secondary | ICD-10-CM | POA: Diagnosis present

## 2013-01-26 DIAGNOSIS — Q278 Other specified congenital malformations of peripheral vascular system: Secondary | ICD-10-CM

## 2013-01-26 DIAGNOSIS — E785 Hyperlipidemia, unspecified: Secondary | ICD-10-CM | POA: Diagnosis present

## 2013-01-26 DIAGNOSIS — Z953 Presence of xenogenic heart valve: Secondary | ICD-10-CM

## 2013-01-26 DIAGNOSIS — I2510 Atherosclerotic heart disease of native coronary artery without angina pectoris: Secondary | ICD-10-CM | POA: Diagnosis present

## 2013-01-26 DIAGNOSIS — Y849 Medical procedure, unspecified as the cause of abnormal reaction of the patient, or of later complication, without mention of misadventure at the time of the procedure: Secondary | ICD-10-CM | POA: Diagnosis not present

## 2013-01-26 DIAGNOSIS — Y9210 Unspecified residential institution as the place of occurrence of the external cause: Secondary | ICD-10-CM | POA: Diagnosis not present

## 2013-01-26 DIAGNOSIS — D62 Acute posthemorrhagic anemia: Secondary | ICD-10-CM | POA: Diagnosis not present

## 2013-01-26 DIAGNOSIS — I359 Nonrheumatic aortic valve disorder, unspecified: Secondary | ICD-10-CM

## 2013-01-26 DIAGNOSIS — Z7982 Long term (current) use of aspirin: Secondary | ICD-10-CM

## 2013-01-26 DIAGNOSIS — Z5181 Encounter for therapeutic drug level monitoring: Secondary | ICD-10-CM

## 2013-01-26 DIAGNOSIS — N189 Chronic kidney disease, unspecified: Secondary | ICD-10-CM | POA: Diagnosis present

## 2013-01-26 DIAGNOSIS — K5900 Constipation, unspecified: Secondary | ICD-10-CM | POA: Diagnosis not present

## 2013-01-26 DIAGNOSIS — I482 Chronic atrial fibrillation, unspecified: Secondary | ICD-10-CM | POA: Diagnosis present

## 2013-01-26 HISTORY — DX: Atherosclerotic heart disease of native coronary artery without angina pectoris: I25.10

## 2013-01-26 HISTORY — DX: Hyperlipidemia, unspecified: E78.5

## 2013-01-26 HISTORY — DX: Permanent atrial fibrillation: I48.21

## 2013-01-26 HISTORY — DX: Adverse effect of unspecified anesthetic, initial encounter: T41.45XA

## 2013-01-26 HISTORY — DX: Other complications of anesthesia, initial encounter: T88.59XA

## 2013-01-26 HISTORY — DX: Personal history of other diseases of the circulatory system: Z86.79

## 2013-01-26 LAB — BLOOD GAS, ARTERIAL
Drawn by: 275531
TCO2: 28.4 mmol/L (ref 0–100)
pCO2 arterial: 43.5 mmHg (ref 35.0–45.0)
pH, Arterial: 7.411 (ref 7.350–7.450)

## 2013-01-26 LAB — COMPREHENSIVE METABOLIC PANEL
ALT: 18 U/L (ref 0–53)
AST: 24 U/L (ref 0–37)
Alkaline Phosphatase: 74 U/L (ref 39–117)
CO2: 30 mEq/L (ref 19–32)
Calcium: 9.7 mg/dL (ref 8.4–10.5)
Chloride: 90 mEq/L — ABNORMAL LOW (ref 96–112)
GFR calc non Af Amer: 33 mL/min — ABNORMAL LOW (ref >90)
Potassium: 4.9 mEq/L (ref 3.5–5.1)
Sodium: 128 mEq/L — ABNORMAL LOW (ref 135–145)
Total Bilirubin: 0.5 mg/dL (ref 0.3–1.2)

## 2013-01-26 LAB — CBC
Platelets: 134 10*3/uL — ABNORMAL LOW (ref 150–400)
RBC: 4.51 MIL/uL (ref 4.22–5.81)
WBC: 6 10*3/uL (ref 4.0–10.5)

## 2013-01-26 MED ORDER — HEPARIN (PORCINE) IN NACL 100-0.45 UNIT/ML-% IJ SOLN
1000.0000 [IU]/h | INTRAMUSCULAR | Status: DC
  Administered 2013-01-26 – 2013-01-27 (×2): 1000 [IU]/h via INTRAVENOUS
  Filled 2013-01-26 (×4): qty 250

## 2013-01-26 MED ORDER — HYDRALAZINE HCL 20 MG/ML IJ SOLN
10.0000 mg | INTRAMUSCULAR | Status: DC | PRN
  Administered 2013-01-29: 10 mg via INTRAVENOUS

## 2013-01-26 MED ORDER — GUAIFENESIN-DM 100-10 MG/5ML PO SYRP: 15.0000 mL | ORAL_SOLUTION | ORAL | Status: DC | PRN

## 2013-01-26 MED ORDER — VALSARTAN-HYDROCHLOROTHIAZIDE 160-12.5 MG PO TABS: 1.0000 | ORAL_TABLET | Freq: Every day | ORAL | Status: DC

## 2013-01-26 MED ORDER — LEVOTHYROXINE SODIUM 25 MCG PO TABS: 25.0000 ug | ORAL_TABLET | Freq: Every day | ORAL | Status: DC

## 2013-01-26 MED ORDER — IPRATROPIUM BROMIDE 0.03 % NA SOLN
2.0000 | Freq: Three times a day (TID) | NASAL | Status: DC
  Filled 2013-01-26 (×2): qty 30

## 2013-01-26 MED ORDER — LABETALOL HCL 5 MG/ML IV SOLN
10.0000 mg | INTRAVENOUS | Status: DC | PRN
  Filled 2013-01-26: qty 4

## 2013-01-26 MED ORDER — COLESEVELAM HCL 625 MG PO TABS
1250.0000 mg | ORAL_TABLET | Freq: Two times a day (BID) | ORAL | Status: DC
  Administered 2013-01-27 – 2013-02-06 (×19): 1250 mg via ORAL
  Filled 2013-01-26 (×25): qty 2

## 2013-01-26 MED ORDER — HYDROCHLOROTHIAZIDE 12.5 MG PO CAPS
12.5000 mg | ORAL_CAPSULE | Freq: Every day | ORAL | Status: DC
  Administered 2013-01-27 – 2013-01-30 (×3): 12.5 mg via ORAL
  Filled 2013-01-26 (×5): qty 1

## 2013-01-26 MED ORDER — COLESEVELAM HCL 625 MG PO TABS
1250.0000 mg | ORAL_TABLET | Freq: Two times a day (BID) | ORAL | Status: DC
  Administered 2013-01-26: 1250 mg via ORAL

## 2013-01-26 MED ORDER — METOPROLOL TARTRATE 1 MG/ML IV SOLN: 2.0000 mg | INTRAVENOUS | Status: DC | PRN

## 2013-01-26 MED ORDER — SODIUM CHLORIDE 0.9 % IV SOLN: INTRAVENOUS | Status: DC

## 2013-01-26 MED ORDER — FLUTICASONE PROPIONATE 50 MCG/ACT NA SUSP
1.0000 | Freq: Every day | NASAL | Status: DC
  Administered 2013-01-27: 1 via NASAL
  Filled 2013-01-26 (×2): qty 16

## 2013-01-26 MED ORDER — ACETAMINOPHEN 650 MG RE SUPP: 325.0000 mg | RECTAL | Status: DC | PRN

## 2013-01-26 MED ORDER — ASPIRIN 81 MG PO TABS: 81.0000 mg | ORAL_TABLET | ORAL | Status: DC

## 2013-01-26 MED ORDER — ASPIRIN EC 81 MG PO TBEC
81.0000 mg | DELAYED_RELEASE_TABLET | ORAL | Status: DC
  Administered 2013-01-28 – 2013-02-06 (×5): 81 mg via ORAL
  Filled 2013-01-26 (×5): qty 1

## 2013-01-26 MED ORDER — IPRATROPIUM BROMIDE 0.06 % NA SOLN
2.0000 | Freq: Three times a day (TID) | NASAL | Status: DC
  Filled 2013-01-26: qty 15

## 2013-01-26 MED ORDER — IRBESARTAN 150 MG PO TABS
150.0000 mg | ORAL_TABLET | Freq: Every day | ORAL | Status: DC
  Administered 2013-01-27 – 2013-01-31 (×4): 150 mg via ORAL
  Filled 2013-01-26 (×6): qty 1

## 2013-01-26 MED ORDER — GUAIFENESIN ER 600 MG PO TB12
600.0000 mg | ORAL_TABLET | Freq: Two times a day (BID) | ORAL | Status: DC | PRN
  Administered 2013-01-26 – 2013-01-31 (×3): 600 mg via ORAL
  Filled 2013-01-26 (×5): qty 1

## 2013-01-26 MED ORDER — ONDANSETRON HCL 4 MG/2ML IJ SOLN: 4.0000 mg | Freq: Four times a day (QID) | INTRAMUSCULAR | Status: DC | PRN

## 2013-01-26 MED ORDER — IPRATROPIUM BROMIDE 0.03 % NA SOLN: 2.0000 | Freq: Three times a day (TID) | NASAL | Status: DC

## 2013-01-26 MED ORDER — POTASSIUM CHLORIDE CRYS ER 20 MEQ PO TBCR: 20.0000 meq | EXTENDED_RELEASE_TABLET | Freq: Once | ORAL | Status: DC

## 2013-01-26 MED ORDER — DEXTROSE 5 % IV SOLN
1.5000 g | INTRAVENOUS | Status: DC
  Filled 2013-01-26: qty 1.5

## 2013-01-26 MED ORDER — CLONIDINE HCL 0.2 MG PO TABS
0.2000 mg | ORAL_TABLET | Freq: Two times a day (BID) | ORAL | Status: DC
  Administered 2013-01-26 – 2013-02-06 (×21): 0.2 mg via ORAL
  Filled 2013-01-26 (×25): qty 1

## 2013-01-26 MED ORDER — AZELASTINE HCL 0.1 % NA SOLN: 1.0000 | Freq: Two times a day (BID) | NASAL | Status: DC

## 2013-01-26 MED ORDER — OXYCODONE-ACETAMINOPHEN 5-325 MG PO TABS: 1.0000 | ORAL_TABLET | ORAL | Status: DC | PRN

## 2013-01-26 MED ORDER — PHENOL 1.4 % MT LIQD
1.0000 | OROMUCOSAL | Status: DC | PRN
  Filled 2013-01-26: qty 177

## 2013-01-26 MED ORDER — MULTIVITAMINS PO TABS: 1.0000 | ORAL_TABLET | Freq: Every day | ORAL | Status: DC

## 2013-01-26 MED ORDER — ADULT MULTIVITAMIN W/MINERALS CH
1.0000 | ORAL_TABLET | Freq: Every day | ORAL | Status: DC
  Administered 2013-01-27 – 2013-02-06 (×10): 1 via ORAL
  Filled 2013-01-26 (×11): qty 1

## 2013-01-26 MED ORDER — GUAIFENESIN ER 600 MG PO TB12: 600.0000 mg | ORAL_TABLET | Freq: Two times a day (BID) | ORAL | Status: DC | PRN

## 2013-01-26 MED ORDER — PANTOPRAZOLE SODIUM 40 MG PO TBEC
40.0000 mg | DELAYED_RELEASE_TABLET | Freq: Every day | ORAL | Status: DC
  Administered 2013-01-27 – 2013-01-28 (×2): 40 mg via ORAL
  Filled 2013-01-26 (×2): qty 1

## 2013-01-26 MED ORDER — LEVOTHYROXINE SODIUM 25 MCG PO TABS
25.0000 ug | ORAL_TABLET | Freq: Every day | ORAL | Status: DC
  Administered 2013-01-27 – 2013-02-06 (×9): 25 ug via ORAL
  Filled 2013-01-26 (×12): qty 1

## 2013-01-26 MED ORDER — FLUTICASONE PROPIONATE 50 MCG/ACT NA SUSP: 1.0000 | Freq: Every day | NASAL | Status: DC

## 2013-01-26 MED ORDER — LORATADINE 10 MG PO TABS
10.0000 mg | ORAL_TABLET | Freq: Every day | ORAL | Status: DC
  Administered 2013-01-27 – 2013-02-06 (×10): 10 mg via ORAL
  Filled 2013-01-26 (×11): qty 1

## 2013-01-26 MED ORDER — AZELASTINE HCL 0.1 % NA SOLN
1.0000 | Freq: Two times a day (BID) | NASAL | Status: DC
  Administered 2013-01-27 – 2013-02-06 (×15): 1 via NASAL
  Filled 2013-01-26 (×3): qty 30

## 2013-01-26 MED ORDER — LORATADINE 10 MG PO TABS: 10.0000 mg | ORAL_TABLET | Freq: Every day | ORAL | Status: DC

## 2013-01-26 MED ORDER — CLONIDINE HCL 0.1 MG PO TABS: 0.2000 mg | ORAL_TABLET | Freq: Two times a day (BID) | ORAL | Status: DC

## 2013-01-26 MED ORDER — ALUM & MAG HYDROXIDE-SIMETH 200-200-20 MG/5ML PO SUSP
15.0000 mL | ORAL | Status: DC | PRN
Start: 2013-01-26 — End: 2013-01-29

## 2013-01-26 MED ORDER — ACETAMINOPHEN 325 MG PO TABS: 325.0000 mg | ORAL_TABLET | ORAL | Status: DC | PRN

## 2013-01-26 NOTE — Telephone Encounter (Signed)
Received a call from Okey Regal at Dr Estanislado Spire office regarding upcoming procedure.  Dr. Patty Sermons had recommended patient bridging with Xarelto since patient has allergy to Lovenox. Dr Myra Gianotti preferred not doing Xarelto bridging, he will admit patient for Heparin bridging. Dr Myra Gianotti would like to have cardiology handle Heparin, discussed with Dr Myrtis Ser (office DOD) and ok for [place] to handle. Called Trish and advised per Dr Myrtis Ser to use Heparin protocol.

## 2013-01-26 NOTE — Addendum Note (Signed)
Addended by: Lars Mage on: 01/26/2013 11:11 AM   Modules accepted: Orders

## 2013-01-26 NOTE — H&P (Signed)
[facility] [place]      History and Physical  Patient name: Evan Perez MRN: [id number] DOB: 1923-01-28 Sex: male   Reason for Admission: No chief complaint on file.   HISTORY OF PRESENT ILLNESS: HISTORY OF PRESENT ILLNESS:  This is a very pleasant 79 year old gentleman that I had seen for evaluation of an aortoiliac aneurysm. This was just detected on ultrasound. 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 The patient is without abdominal or back pain. He is very active.  The patient has history of hypertension which is treated medically. He also suffers from coronary artery disease. He is status post coronary artery bypass grafting in November of 2011. He had aortic valve replacement with a by a prosthetic valve. He is taking Coumadin for atrial fibrillation. He takes a statin for hypercholesterolemia. He underwent angiography and coiling of his left hypogastric artery, in anticipation of coverage with a stent graft. 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   Past Medical History  Diagnosis Date  . Aortic valve disorder   . AAA (abdominal aortic aneurysm)     CHRONIC AFIB  . Hypertension   . Fall     FRACTURE VERTEBRAL 2011  . CHF (congestive heart failure)     LV DYSFUNCTION -EF 55% JAN 2012  . Atrial fibrillation   . Cancer     Past Surgical History  Procedure Laterality Date  . Aortic valve replacement      NOV 2011-TISSUE VALVE, 23 mm Mitroflow aortic pericardial heart valve  . Cardiac catheterization      03/2010-non obstructive CAD; severe AS  . Vasectomy    . Tonsillectomy      History   Social History  . Marital Status: Widowed    Spouse Name: N/A    Number of Children: N/A  . Years of Education: N/A   Occupational History  . Not on file.   Social History Main Topics  . Smoking status: Former Smoker    Quit date: 06/04/1988  . Smokeless tobacco: Never Used  . Alcohol Use: 1.2 - 3 oz/week   1-3 Glasses of wine, 1-2 Shots of liquor per week  . Drug Use: No  . Sexual Activity: Not on file   Other Topics Concern  . Not on file   Social History Narrative  . No narrative on file    Family History  Problem Relation Age of Onset  . Hypertension Mother   . Heart disease Mother   . Heart attack Mother   . Hypertension Father   . Heart disease Father   . Heart disease Brother     Allergies as of 01/08/2013 - Review Complete 01/06/2013  Allergen Reaction Noted  . Dust mite extract  05/14/2012  . Lipitor [atorvastatin calcium] Other (See Comments) 09/12/2010  . Ramipril Other (See Comments) 09/12/2010  . Zocor [simvastatin] Other (See Comments) 09/12/2010  . Lovenox [enoxaparin sodium] Rash 01/05/2013    No current facility-administered medications on file prior to encounter.   Current Outpatient Prescriptions on File Prior to Encounter  Medication Sig Dispense Refill  . aspirin 81 MG tablet Take 81 mg by mouth every Monday, Wednesday, and Friday.       Marland Kitchen azelastine (ASTELIN) 137 MCG/SPRAY nasal spray Place 1 spray into the nose 2 (two) times daily. Use in each nostril as directed      . betamethasone valerate (VALISONE) 0.1 % cream Apply 1 application topically  daily as needed (rash).      . cloNIDine (CATAPRES) 0.1 MG tablet Take 2 tablets (0.2 mg total) by mouth 2 (two) times daily.  120 tablet  5  . colesevelam (WELCHOL) 625 MG tablet Take 1,250 mg by mouth 2 (two) times daily with a meal.       . guaiFENesin (MUCINEX) 600 MG 12 hr tablet Take 600 mg by mouth 2 (two) times daily as needed for congestion. For congestion.      Marland Kitchen ipratropium (ATROVENT) 0.03 % nasal spray Place 2 sprays into the nose 2 (two) times daily as needed for rhinitis.       Marland Kitchen levothyroxine (SYNTHROID, LEVOTHROID) 25 MCG tablet Take 1 tablet (25 mcg total) by mouth daily.  30 tablet  11  . loratadine (CLARITIN) 10 MG tablet Take 10 mg by mouth daily.      . mometasone (NASONEX) 50 MCG/ACT nasal  spray Place 2 sprays into the nose daily as needed (for allergies).       . multivitamin (THERAGRAN) per tablet Take 1 tablet by mouth daily.       Marland Kitchen OVER THE COUNTER MEDICATION Place 1 spray into both nostrils daily as needed (for allergies). neilmed nasal wash      . valsartan-hydrochlorothiazide (DIOVAN HCT) 160-12.5 MG per tablet Take 1 tablet by mouth daily.  30 tablet  5  . warfarin (COUMADIN) 5 MG tablet Take 5-7.5 mg by mouth daily. 5 mg daily except 7.5 mg on Monday and Friday         REVIEW OF SYSTEMS: No changes  PHYSICAL EXAMINATION: General: The patient appears their stated age.  Vital signs are There were no vitals taken for this visit. HEENT:  No gross abnormalities Pulmonary: Respirations are non-labored Abdomen: Soft and non-tender with. Musculoskeletal: There are no major deformities.   Neurologic: No focal weakness or paresthesias are detected, Skin: There are no ulcer or rashes noted. Psychiatric: The patient has normal affect. Cardiovascular: There is a regular rate and rhythm without significant murmur appreciated.  Diagnostic Studies: Carotid Dopplers: Less than 40% stenosis bilaterally ABI: Right is 0.94, left is 0.76     Assessment:  Left common iliac aneurysm Plan: The patient will be admitted for IV heparinization prior to his aneurysm repair. The patient has a mechanical heart valve and is on chronic anti-coagulation. He was placed on Lovenox for his hypogastric artery embolization, and developed a total body rash. His rash has healed. He will be admitted for IV heparin.     Jorge Ny, M.D. [facility] [place] Office: [phone] Pager:  [phone]

## 2013-01-26 NOTE — Progress Notes (Signed)
ANTICOAGULATION CONSULT NOTE - Initial Consult  Pharmacy Consult:  Heparin Indication:   Afib  Allergies  Allergen Reactions  . Dust Mite Extract   . Lipitor [Atorvastatin Calcium] Other (See Comments)    Nervousness     . Ramipril Other (See Comments)    Pt cannot remember reaction   . Zocor [Simvastatin] Other (See Comments)    Nervousness   . Lovenox [Enoxaparin Sodium] Rash    Rash on abdomen, back, lower legs.     Patient Measurements: Height: 5\' 10"  (177.8 cm) Weight: 169 lb 15.6 oz (77.1 kg) IBW/kg (Calculated) : 73 Heparin Dosing Weight: 77 kg  Vital Signs: Temp: 98.3 F (36.8 C) (08/25 1635) Temp src: Oral (08/25 1635) BP: 141/72 mmHg (08/25 1635) Pulse Rate: 74 (08/25 1635)  Labs:  Recent Labs  01/26/13 1700  HGB 13.5  HCT 40.6  PLT 134*  APTT 34  LABPROT 19.9*  INR 1.75*    Estimated Creatinine Clearance: 25.1 ml/min (by C-G formula based on Cr of 2.1).   Medical History: Past Medical History  Diagnosis Date  . History of aortic stenosis     Severe s/p bovine pericardial tissue AVR 04/2010  . AAA (abdominal aortic aneurysm)   . Hypertension   . Fall     FRACTURE VERTEBRAL 2011  . CHF (congestive heart failure)     LV DYSFUNCTION -EF 55% JAN 2012  . Permanent atrial fibrillation     On chronic Coumadin anticoagulation  . Cancer   . CAD (coronary artery disease)     Nonobstructive by 2011 cath  . Hyperlipidemia         Assessment: 43 YOM with history of Afib and prosthetic AVR on Coumadin PTA.  Patient needs to undergo aneurysm repair and is admitted for IV heparin bridge while Coumadin is on hold.  Noted patient has an allergy to Lovenox.  INR currently sub-therapeutic at 1.75.  His hemoglobin is WNL and his platelets is a little low at 134.  No bleeding reported.   Goal of Therapy:  Heparin level 0.3-0.7 units/ml Monitor platelets by anticoagulation protocol: Yes    Plan:  - Heparin gtt at 1000 units/hr, no bolus - Check 8 hr  HL - Daily HL / CBC    Thresa Dozier D. Laney Potash, PharmD, BCPS Pager:  [phone] 01/26/2013, 7:22 PM

## 2013-01-26 NOTE — Progress Notes (Signed)
The patient is here for further discussions of his left iliac aneurysm repair which is scheduled for this Thursday. The patient had several questions which he wanted to discuss. I had a discussion of approximately 30 minutes with the patient and his friend detailing the intricacies of the operation. I diagrammed out his aneurysm and the proposed treatment method. His biggest issue is anti-coagulation. He is anticoagulated for a mechanical heart valve. He was given a bridge with Lovenox however developed a severe total body rash. We are coordinating with his cardiologist the most appropriate step for management of his anticoagulation. He may require preprocedure admission for heparin.

## 2013-01-26 NOTE — Consult Note (Signed)
Cardiology Consult Note   Patient ID: JAKAIDEN FILL MRN: [id number], DOB/AGE: 09-23-1922   Admit date: 01/26/2013 Date of Consult: 01/26/2013  Primary Physician: Rogelia Boga, MD Primary Cardiologist: Wylene Simmer, MD  Reason for consult: management of peri-op anticoagulation  HPI: Evan Perez is a 79 y.o. 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 He is admitted to [facility] today for management of pre-op anticoagulation prior to undergoing endovascular repair.   He followed up with Dr. Myra Gianotti in 12/2012 after an aortoiliac aneurysm was detected incidentally on u/s (measuring 3.9 cm). He was asymptomatic with this. He has a history of nonobstructive CAD (no prior CABG or PCI). He did undergo bovine pericardial tissue valve AVR in 04/2010 for severe AS. There was post-op CHF associated with this which improved on recovery (EF 55%). The patient underwent CT-A revealing diffuse ulcerations. He underwent coil embolization of the left hypogastric artery prior to endovascular repair. Left main renal artery stenosis was described. The procedure has been tentatively planned for 8/28. There was a question of mechanical AVR and bridging pre-op. The patient has a tissue valve. Anticoagulation in the peri-op period would serve as thromboembolic protection from a-fib. The patient was admitted for this reason and our office was called to assist in managing. He has a history of rash on Lovenox.   Labwork pending - PT/INR, CBC, BMET, PTT  Problem List: Past Medical History  Diagnosis Date  . Aortic valve disorder   . AAA (abdominal aortic aneurysm)     CHRONIC AFIB  . Hypertension   . Fall     FRACTURE VERTEBRAL 2011  . CHF (congestive heart failure)     LV DYSFUNCTION -EF 55% JAN 2012  . Atrial fibrillation   . Cancer      Past Surgical History  Procedure Laterality Date  . Aortic valve replacement      NOV 2011-TISSUE VALVE, 23 mm Mitroflow aortic pericardial heart valve  . Cardiac catheterization      03/2010-non obstructive CAD; severe AS  . Vasectomy    . Tonsillectomy       Allergies:  Allergies  Allergen Reactions  . Dust Mite Extract   . Lipitor [Atorvastatin Calcium] Other (See Comments)    Nervousness     . Ramipril Other (See Comments)    Pt cannot remember reaction   . Zocor [Simvastatin] Other (See Comments)    Nervousness   . Lovenox [Enoxaparin Sodium] Rash    Rash on abdomen, back, lower legs.     Home Medications: Prior to Admission medications   Medication Sig Start Date End Date Taking? Authorizing Provider  aspirin 81 MG tablet Take 81 mg by mouth every Monday, Wednesday, and Friday.     Historical Provider, MD  azelastine (ASTELIN) 137 MCG/SPRAY nasal spray Place 1 spray into the nose 2 (two) times daily. Use in each nostril as directed    Historical Provider, MD  betamethasone valerate (VALISONE) 0.1 % cream Apply 1 application topically daily as needed (rash).    Historical Provider, MD  cloNIDine (CATAPRES) 0.1 MG tablet Take 2 tablets (0.2 mg total) by mouth 2 (two) times daily. 07/14/12   Cassell Clement, MD  colesevelam (WELCHOL) 625 MG tablet Take 1,250 mg by mouth 2 (two) times daily with a meal.     Historical Provider, MD  enoxaparin (LOVENOX) 80 MG/0.8ML  injection  01/01/13   Historical Provider, MD  guaiFENesin (MUCINEX) 600 MG 12 hr tablet Take 600 mg by mouth 2 (two) times daily as needed for congestion. For congestion.    Historical Provider, MD  ipratropium (ATROVENT) 0.03 % nasal spray Place 2 sprays into the nose 2 (two) times daily as needed for rhinitis.     Historical Provider, MD  levothyroxine (SYNTHROID, LEVOTHROID) 25 MCG tablet Take 1 tablet (25 mcg total) by mouth daily. 06/02/12   Cassell Clement, MD  loratadine (CLARITIN) 10 MG tablet Take 10 mg  by mouth daily.    Historical Provider, MD  mometasone (NASONEX) 50 MCG/ACT nasal spray Place 2 sprays into the nose daily as needed (for allergies).     Historical Provider, MD  multivitamin [facility]) per tablet Take 1 tablet by mouth daily.     Historical Provider, MD  OVER THE COUNTER MEDICATION Place 1 spray into both nostrils daily as needed (for allergies). neilmed nasal wash    Historical Provider, MD  Rivaroxaban (XARELTO) 15 MG TABS tablet Take 15 mg by mouth daily. Starting 01/26/13 for 3 total days then will stop.    Historical Provider, MD  valsartan-hydrochlorothiazide (DIOVAN HCT) 160-12.5 MG per tablet Take 1 tablet by mouth daily. 07/09/12 07/09/13  Cassell Clement, MD  warfarin (COUMADIN) 5 MG tablet Take 5-7.5 mg by mouth daily. 5 mg daily except 7.5 mg on Monday and Friday    Historical Provider, MD    Inpatient Medications:  . aspirin  81 mg Oral Q M,W,F  . azelastine  1 spray Each Nare BID  . cloNIDine  0.2 mg Oral BID  . colesevelam  1,250 mg Oral BID WC  . fluticasone  1 spray Each Nare Daily  . ipratropium  2 spray Nasal TID  . levothyroxine  25 mcg Oral Daily  . loratadine  10 mg Oral Daily  . multivitamin  1 tablet Oral Daily  . pantoprazole  40 mg Oral Daily  . potassium chloride  20-40 mEq Oral Once  . valsartan-hydrochlorothiazide  1 tablet Oral Daily   Prescriptions prior to admission  Medication Sig Dispense Refill  . aspirin 81 MG tablet Take 81 mg by mouth every Monday, Wednesday, and Friday.       Marland Kitchen azelastine (ASTELIN) 137 MCG/SPRAY nasal spray Place 1 spray into the nose 2 (two) times daily. Use in each nostril as directed      . betamethasone valerate (VALISONE) 0.1 % cream Apply 1 application topically daily as needed (rash).      . cloNIDine (CATAPRES) 0.1 MG tablet Take 2 tablets (0.2 mg total) by mouth 2 (two) times daily.  120 tablet  5  . colesevelam (WELCHOL) 625 MG tablet Take 1,250 mg by mouth 2 (two) times daily with a meal.       .  enoxaparin (LOVENOX) 80 MG/0.8ML injection       . guaiFENesin (MUCINEX) 600 MG 12 hr tablet Take 600 mg by mouth 2 (two) times daily as needed for congestion. For congestion.      Marland Kitchen ipratropium (ATROVENT) 0.03 % nasal spray Place 2 sprays into the nose 2 (two) times daily as needed for rhinitis.       Marland Kitchen levothyroxine (SYNTHROID, LEVOTHROID) 25 MCG tablet Take 1 tablet (25 mcg total) by mouth daily.  30 tablet  11  . loratadine (CLARITIN) 10 MG tablet Take 10 mg by mouth daily.      . mometasone (NASONEX) 50 MCG/ACT nasal spray Place  2 sprays into the nose daily as needed (for allergies).       . multivitamin (THERAGRAN) per tablet Take 1 tablet by mouth daily.       Marland Kitchen OVER THE COUNTER MEDICATION Place 1 spray into both nostrils daily as needed (for allergies). neilmed nasal wash      . Rivaroxaban (XARELTO) 15 MG TABS tablet Take 15 mg by mouth daily. Starting 01/26/13 for 3 total days then will stop.      . valsartan-hydrochlorothiazide (DIOVAN HCT) 160-12.5 MG per tablet Take 1 tablet by mouth daily.  30 tablet  5  . warfarin (COUMADIN) 5 MG tablet Take 5-7.5 mg by mouth daily. 5 mg daily except 7.5 mg on Monday and Friday        Family History  Problem Relation Age of Onset  . Hypertension Mother   . Heart disease Mother   . Heart attack Mother   . Hypertension Father   . Heart disease Father   . Heart disease Brother      History   Social History  . Marital Status: Widowed    Spouse Name: N/A    Number of Children: N/A  . Years of Education: N/A   Occupational History  . Not on file.   Social History Main Topics  . Smoking status: Former Smoker    Quit date: 06/04/1988  . Smokeless tobacco: Never Used  . Alcohol Use: 1.2 - 3 oz/week    1-3 Glasses of wine, 1-2 Shots of liquor per week  . Drug Use: No  . Sexual Activity: Not on file   Other Topics Concern  . Not on file   Social History Narrative  . No narrative on file     Review of Systems: General: negative  for chills, fever, night sweats or weight changes.  Cardiovascular: negative for chest pain, dyspnea on exertion, edema, orthopnea, palpitations, paroxysmal nocturnal dyspnea or shortness of breath Dermatological: negative for rash Respiratory: negative for cough or wheezing Urologic: negative for hematuria Abdominal: negative for nausea, vomiting, diarrhea, bright red blood per rectum, melena, or hematemesis Neurologic: negative for visual changes, syncope, or dizziness All other systems reviewed and are otherwise negative except as noted above.  Physical Exam: Blood pressure 141/72, pulse 74, temperature 98.3 F (36.8 C), temperature source Oral, resp. rate 18, height 5\' 10"  (1.778 m), SpO2 99.00%.     General:  Well developed, well nourished, in no acute distress. Head:  Normocephalic, atraumatic, sclera non-icteric, no xanthomas, nares are without discharge.  Neck:  Negative for carotid bruits. JVD not elevated. Lungs:  Clear bilaterally to auscultation without wheezes, rales, or rhonchi. Breathing is unlabored. Heart:  RRR with S1 S2. SEM through tissue AVR  No  rubs, or gallops appreciated. Abdomen:  Soft, non-tender, non-distended with normoactive bowel sounds. No hepatomegaly. No rebound/guarding. No obvious abdominal masses. Msk:   Strength and tone appears normal for age. Extremities:  No clubbing, cyanosis or edema.  Distal pedal pulses are 2+ and equal bilaterally. Neuro:  Alert and oriented X 3. Moves all extremities spontaneously. Psych:  Responds to questions appropriately with a normal affect.  Labs:  Pending  Radiology/Studies: Ct Angio Abd/pel W/ And/or W/o  12/29/2012   *RADIOLOGY REPORT*  Clinical Data:  Abdominal aortic aneurysm.  CT ANGIOGRAPHY ABDOMEN AND PELVIS  Technique:  Multidetector CT imaging of the abdomen and pelvis was performed using the standard protocol during bolus administration of intravenous contrast.  Multiplanar reconstructed images including  MIPs were obtained  and reviewed to evaluate the vascular anatomy.  Contrast: 60mL OMNIPAQUE IOHEXOL 300 MG/ML  SOLN  intravenously.  Comparison:   None.  Findings:  Visualized lung bases appear normal.  The liver, spleen and pancreas appear normal.  No gallstones are noted.  Adrenal glands appear normal.  Bilateral cortical scarring of both kidneys is noted.  No hydronephrosis or renal obstruction is noted. Aneurysmal dilatation of the proximal portion of the infrarenal abdominal aorta is noted measuring 4.0 x 3.7 cm in size.  More inferiorly, the aneurysm also measures 4.0 x 3.7 cm.  The celiac artery is widely patent as well as the inferior mesenteric artery. The superior mesenteric artery is patent although a severe eccentric plaque is noted proximally.  Renal arteries are widely patent, although severe stenoses are noted at their origins as well due to aortic plaques.  Multiple large atherosclerotic ulcers are noted in the proximal infrarenal abdominal aorta.  There is noted either a large chronic dissection or atherosclerotic ulcer involving the left common iliac artery that measures 6.4 x 3.4 cm.  It results in compression of the true lumen of this vessel. Similar abnormality is seen involving the proximal right common iliac artery that measures 3.0 x 2.9 cm in size. The internal and external iliac arteries as well as the common femoral arteries are widely patent as well.   Review of the MIP images confirms the above findings.  IMPRESSION: Infrarenal abdominal aortic aneurysm is noted with maximum measured transverse size of 4.0 x 3.7 cm.  Multiple large atherosclerotic ulcers are seen in this aneurysm.  The mesenteric and renal arteries are patent, although significant stenoses are seen involving the origins of both renal arteries and the superior mesenteric artery.  Also noted is a large chronic dissection or atherosclerotic ulcer involving the left common iliac artery which measures 6.4 x 3.4 cm and results  in significant compression of the true lumen of this vessel.  Similar but smaller abnormality is seen involving the proximal right common iliac artery which measures 3.0 x 2.9 cm.  This also results in significant narrowing of the true lumen of the vessel.   Original Report Authenticated By: Lupita Raider.,  M.D.   EKG: atrial fibrillation, 75 bpm, RBBB, inferior IVCD, LAD, no ST/T changes  ASSESSMENT AND PLAN:   1. AAA/left iliac artery aneurysm to undergo scheduled endovascular repair 2. H/o severe AS s/p bovine pericardial tissue AVR 04/2010 3. Permanent atrial fibrillation 4. Chronic Coumadin anticoagulation 5. Nonobstructive CAD  6. HTN 7. HLD 8. Rash on Lovenox  Will plan to initiate a-fib dose heparin pre-operatively. Hold Coumadin. Resume anticoagulation when deemed appropriate by [facility] post-op. Will need heparin bridging to Coumadin. Check PT/INR. Hold ASA.    Signed, R. Hurman Horn, PA-C 01/26/2013, 5:40 PM   Patient examined chart reviewed.  On coumadin for chronic afib  No previous TIA . Has tissue valve not requiring anticoagulation.  INR was 1.7 sub Rx a week ago.  Last took coumadin Saturday.  Start heparin when INR less than  1.8.  Suspect angiogram may be able to be done sooner than Thursday depending on Dr Estanislado Spire schedule Exam remarkable for SEM through tissue AVR with no AR  Evan Perez

## 2013-01-27 DIAGNOSIS — I714 Abdominal aortic aneurysm, without rupture: Secondary | ICD-10-CM

## 2013-01-27 LAB — URINALYSIS, ROUTINE W REFLEX MICROSCOPIC
Glucose, UA: NEGATIVE mg/dL
Ketones, ur: NEGATIVE mg/dL
Leukocytes, UA: NEGATIVE
pH: 6.5 (ref 5.0–8.0)

## 2013-01-27 LAB — CBC
HCT: 35.7 % — ABNORMAL LOW (ref 39.0–52.0)
MCH: 30.8 pg (ref 26.0–34.0)
MCHC: 34.5 g/dL (ref 30.0–36.0)
MCV: 89.5 fL (ref 78.0–100.0)
RDW: 13.4 % (ref 11.5–15.5)

## 2013-01-27 LAB — TYPE AND SCREEN: ABO/RH(D): A POS

## 2013-01-27 MED ORDER — TIOTROPIUM BROMIDE MONOHYDRATE 18 MCG IN CAPS
18.0000 ug | ORAL_CAPSULE | Freq: Every day | RESPIRATORY_TRACT | Status: DC
  Administered 2013-01-28 – 2013-02-05 (×6): 18 ug via RESPIRATORY_TRACT
  Filled 2013-01-27 (×3): qty 5

## 2013-01-27 MED ORDER — IPRATROPIUM BROMIDE HFA 17 MCG/ACT IN AERS
2.0000 | INHALATION_SPRAY | RESPIRATORY_TRACT | Status: DC
  Filled 2013-01-27: qty 12.9

## 2013-01-27 NOTE — Care Management Note (Unsigned)
    Page 1 of 1   02/03/2013     3:25:20 PM   CARE MANAGEMENT NOTE 02/03/2013  Patient:  Evan Perez, Evan Perez   Account Number:  [id number]  Date Initiated:  01/27/2013  Documentation initiated by:  Kinzlee Selvy  Subjective/Objective Assessment:   PT ADM ON 01/26/13 WITH AAA, REQUIRING REPAIR ON 8/28.  PTA, PT LIVES AT [facility].     Action/Plan:   WILL FOLLOW UP WITH PT ON DISCHARGE PLANS.  MAY NEED SKILLED LOC, DEPENDING ON PROGRESS POST OP.   Anticipated DC Date:  02/03/2013   Anticipated DC Plan:  HOME/SELF CARE      DC Planning Services  CM consult      Choice offered to / List presented to:             Status of service:  In process, will continue to follow Medicare Important Message given?   (If response is "NO", the following Medicare IM given date fields will be blank) Date Medicare IM given:   Date Additional Medicare IM given:    Discharge Disposition:    Per UR Regulation:  Reviewed for med. necessity/level of care/duration of stay  If discussed at Long Length of Stay Meetings, dates discussed:    Comments:  02/03/13 Ayza Ripoll,RN,BSN [phone] P.T.  EVAL TODAY; RECOMMENDING NO FOLLOW UP AT DC.  WILL FOLLOW.  01/30/13- 1045- Donn Pierini RN, BSN [phone] s/p AAA repair on 01/29/13- to tx back to 2W tele today- Bridging with xarelto per cardiology.  Home when okay with cards.

## 2013-01-27 NOTE — Progress Notes (Addendum)
Patient ID: Evan Perez, male   DOB: Apr 17, 1923, 79 y.o.   MRN: [id number]    Subjective:  Denies SSCP, palpitations or Dyspnea   Objective:  Filed Vitals:   01/26/13 1635 01/26/13 2029 01/27/13 0442  BP: 141/72 138/61 108/61  Pulse: 74 75 71  Temp: 98.3 F (36.8 C) 97.7 F (36.5 C) 97.6 F (36.4 C)  TempSrc: Oral Oral Oral  Resp: 18 18 18   Height: 5\' 10"  (1.778 m)    Weight: 169 lb 15.6 oz (77.1 kg)    SpO2: 99% 98% 98%    Intake/Output from previous day:  Intake/Output Summary (Last 24 hours) at 01/27/13 0750 Last data filed at 01/27/13 0442  Gross per 24 hour  Intake      0 ml  Output    150 ml  Net   -150 ml    Physical Exam: Affect appropriate Healthy:  appears stated age HEENT: normal Neck supple with no adenopathy JVP normal no bruits no thyromegaly Lungs clear with no wheezing and good diaphragmatic motion Heart:  S1/S2 SEM murmur, no rub, gallop or click PMI normal Abdomen: benighn, BS positve, no tenderness, no AAA no bruit.  No HSM or HJR Distal pulses intact with no bruits No edema Neuro non-focal Skin warm and dry No muscular weakness   Lab Results: Basic Metabolic Panel:  Recent Labs  40/98/11 1700  NA 128*  K 4.9  CL 90*  CO2 30  GLUCOSE 87  BUN 36*  CREATININE 1.73*  CALCIUM 9.7   Liver Function Tests:  Recent Labs  01/26/13 1700  AST 24  ALT 18  ALKPHOS 74  BILITOT 0.5  PROT 8.2  ALBUMIN 4.2   No results found for this basename: LIPASE, AMYLASE,  in the last 72 hours CBC:  Recent Labs  01/26/13 1700 01/27/13 0405  WBC 6.0 5.5  HGB 13.5 12.3*  HCT 40.6 35.7*  MCV 90.0 89.5  PLT 134* 120*    Imaging: Dg Chest Portable 1 View  01/26/2013   *RADIOLOGY REPORT*  Clinical Data: Preop for abdominal aortic aneurysm.  Hypertension. Ex-smoker.  PORTABLE CHEST - 1 VIEW  Comparison: 05/13/2012  Findings: Mild hyperinflation. Prior median sternotomy. Midline trachea.  Mild cardiomegaly with atherosclerosis in the  transverse aorta. No pleural effusion or pneumothorax.  Apparent nodular density projecting over the right upper lobe is most likely related to the overlying EKG lead.  There is also left base scarring. Nodular density projects over the right lung base laterally.  IMPRESSION: Cardiomegaly, without acute disease.  Probable artifactual density projecting over the right upper lobe.  Consider repeat frontal radiograph with removal of EKG leads.  Possible nipple shadow projecting at the right lung base.  Consider nipple markers on repeat frontal.   Original Report Authenticated By: Jeronimo Greaves, M.D.    Cardiac Studies:  ECG:  afib nonspecific ST/T wave changes   Telemetry:  afib no VT 01/27/2013   Echo:   Medications:   . [START ON 01/28/2013] aspirin EC  81 mg Oral Q M,W,F  . azelastine  1 spray Each Nare BID  . [START ON 01/29/2013] cefUROXime (ZINACEF)  IV  1.5 g Intravenous 30 min Pre-Op  . cloNIDine  0.2 mg Oral BID  . colesevelam  1,250 mg Oral BID WC  . fluticasone  1 spray Each Nare Daily  . hydrochlorothiazide  12.5 mg Oral Daily  . ipratropium  2 spray Each Nare TID  . irbesartan  150 mg Oral Daily  .  levothyroxine  25 mcg Oral QAC breakfast  . loratadine  10 mg Oral Daily  . multivitamin with minerals  1 tablet Oral Daily  . pantoprazole  40 mg Oral Daily  . potassium chloride  20-40 mEq Oral Once     . sodium chloride    . sodium chloride    . heparin 1,000 Units/hr (01/26/13 2149)    Assessment/Plan:  Afib:  Good rate control  ON heparin INR 1.75 AVR:  Tissue not mechanical  Pleas transfer to [facility] service post procedure.  He has a tissue valve in aortic position and is on coumadin for afib.       (See my note on 01/28/2013--(Katz)-- Dr. Eden Emms saw the patient today.   I am aware 01/28/2013  that Dr. Patty Sermons has specific reasons for wanting the patient to be fully anicoagulated before and after proceedures. This is being done)(Katz)  Charlton Haws 01/27/2013, 7:50  AM

## 2013-01-27 NOTE — Progress Notes (Signed)
PHARMACY FOLLOW UP CONSULT NOTE   Pharmacy Consult :  Heparin bridging Indication: Heparin bridging in preparation for aneurysm repair surgery  Dosing Weight: 77 kg  Labs:  Recent Labs  01/26/13 1700 01/27/13 0405 01/27/13 1431  HGB 13.5 12.3*  --   HCT 40.6 35.7*  --   PLT 134* 120*  --   INR 1.75*  --   --   HEPARINUNFRC  --  0.38 0.57  CREATININE 1.73*  --   --    Lab Results  Component Value Date   INR 1.75* 01/26/2013   INR 1.7 01/19/2013   INR 1.4 01/12/2013   Estimated Creatinine Clearance: 30.5 ml/min (by C-G formula based on Cr of 1.73).  Medications:  Scheduled:  . [START ON 01/28/2013] aspirin EC  81 mg Oral Q M,W,F  . azelastine  1 spray Each Nare BID  . [START ON 01/29/2013] cefUROXime (ZINACEF)  IV  1.5 g Intravenous 30 min Pre-Op  . cloNIDine  0.2 mg Oral BID  . colesevelam  1,250 mg Oral BID WC  . fluticasone  1 spray Each Nare Daily  . hydrochlorothiazide  12.5 mg Oral Daily  . ipratropium  2 puff Inhalation Q4H  . irbesartan  150 mg Oral Daily  . levothyroxine  25 mcg Oral QAC breakfast  . loratadine  10 mg Oral Daily  . multivitamin with minerals  1 tablet Oral Daily  . pantoprazole  40 mg Oral Daily  . potassium chloride  20-40 mEq Oral Once   Infusions:  . sodium chloride    . sodium chloride    . heparin 1,000 Units/hr (01/26/13 2149)    Assessment:  79 y/o male currently on Heparin infusion for bridging to prepare for aneurysm repair surgery later this week.  Heparin rate 1000 units/hr, Heparin level 0.57 units/ml > within therapeutic range. No bleeding complications noted   Goal of Therapy:  Heparin level 0.3-0.7 units/ml   Plan:   Continue Heparin infusion at the same rate. Daily Heparin Levels, CBC.  Monitor for bleeding complications   Laurena Bering, Pharm.D.  01/27/2013,3:50 PM

## 2013-01-27 NOTE — Progress Notes (Signed)
ANTICOAGULATION CONSULT NOTE  Pharmacy Consult:  Heparin Indication:   Afib  Allergies  Allergen Reactions  . Dust Mite Extract   . Lipitor [Atorvastatin Calcium] Other (See Comments)    Nervousness     . Ramipril Other (See Comments)    Pt cannot remember reaction   . Zocor [Simvastatin] Other (See Comments)    Nervousness   . Lovenox [Enoxaparin Sodium] Rash    Rash on abdomen, back, lower legs.     Patient Measurements: Height: 5\' 10"  (177.8 cm) Weight: 169 lb 15.6 oz (77.1 kg) IBW/kg (Calculated) : 73 Heparin Dosing Weight: 77 kg  Vital Signs: Temp: 97.6 F (36.4 C) (08/26 0442) Temp src: Oral (08/26 0442) BP: 108/61 mmHg (08/26 0442) Pulse Rate: 71 (08/26 0442)  Labs:  Recent Labs  01/26/13 1700 01/27/13 0405  HGB 13.5 12.3*  HCT 40.6 35.7*  PLT 134* 120*  APTT 34  --   LABPROT 19.9*  --   INR 1.75*  --   HEPARINUNFRC  --  0.38  CREATININE 1.73*  --     Estimated Creatinine Clearance: 30.5 ml/min (by C-G formula based on Cr of 1.73).  Assessment: 79 yo male with h/o Afib, Coumadin on hold, for heparin  Goal of Therapy:  Heparin level 0.3-0.7 units/ml Monitor platelets by anticoagulation protocol: Yes    Plan:  Continue Heparin at current rate Recheck level later this am to ensure remains within goal range  Geannie Risen, PharmD, BCPS  01/27/2013, 5:05 AM

## 2013-01-27 NOTE — Progress Notes (Signed)
[facility] Progress Note  01/27/2013 7:34 AM HD 1  Subjective:  "I'm feeling good this morning"  VSS Afebrile 98% RA  Filed Vitals:   01/27/13 0442  BP: 108/61  Pulse: 71  Temp: 97.6 F (36.4 C)  Resp: 18    Physical Exam: Cardiac:  Afib  Lungs:  Non labored   CBC    Component Value Date/Time   WBC 5.5 01/27/2013 0405   RBC 3.99* 01/27/2013 0405   HGB 12.3* 01/27/2013 0405   HCT 35.7* 01/27/2013 0405   PLT 120* 01/27/2013 0405   MCV 89.5 01/27/2013 0405   MCH 30.8 01/27/2013 0405   MCHC 34.5 01/27/2013 0405   RDW 13.4 01/27/2013 0405   LYMPHSABS 1.0 01/15/2012 1632   MONOABS 0.9 01/15/2012 1632   EOSABS 0.6 01/15/2012 1632   BASOSABS 0.0 01/15/2012 1632    BMET    Component Value Date/Time   NA 128* 01/26/2013 1700   K 4.9 01/26/2013 1700   CL 90* 01/26/2013 1700   CO2 30 01/26/2013 1700   GLUCOSE 87 01/26/2013 1700   BUN 36* 01/26/2013 1700   CREATININE 1.73* 01/26/2013 1700   CREATININE 1.60* 12/29/2012 0821   CALCIUM 9.7 01/26/2013 1700   GFRNONAA 33* 01/26/2013 1700   GFRAA 39* 01/26/2013 1700    INR    Component Value Date/Time   INR 1.75* 01/26/2013 1700   INR 1.7 01/19/2013 1355   Heparin level: 0.38  Intake/Output Summary (Last 24 hours) at 01/27/13 0734 Last data filed at 01/27/13 0442  Gross per 24 hour  Intake      0 ml  Output    150 ml  Net   -150 ml     Assessment/Plan:  79 y.o. male is  Admitted for coumadin/heparin bridge for surgery on Thursday HD 1  -INR yesterday is 1.75-continue heparin per cardiology (Afib)-pt does not have a mechanical heart valve-it is bovine pericardial valve. -check INR in am -Creatinine is 1.73 yesterday   Doreatha Massed, PA-C [facility] [phone] 01/27/2013 7:34 AM

## 2013-01-28 LAB — CBC
HCT: 36 % — ABNORMAL LOW (ref 39.0–52.0)
MCH: 30.3 pg (ref 26.0–34.0)
MCV: 89.3 fL (ref 78.0–100.0)
RDW: 13.5 % (ref 11.5–15.5)
WBC: 5.6 10*3/uL (ref 4.0–10.5)

## 2013-01-28 LAB — GLUCOSE, CAPILLARY: Glucose-Capillary: 116 mg/dL — ABNORMAL HIGH (ref 70–99)

## 2013-01-28 MED ORDER — PHYTONADIONE 5 MG PO TABS
2.5000 mg | ORAL_TABLET | Freq: Once | ORAL | Status: AC
  Administered 2013-01-28: 11:00:00 via ORAL
  Filled 2013-01-28: qty 1

## 2013-01-28 MED ORDER — HEPARIN (PORCINE) IN NACL 100-0.45 UNIT/ML-% IJ SOLN
1000.0000 [IU]/h | INTRAMUSCULAR | Status: DC
  Administered 2013-01-28: 1000 [IU]/h via INTRAVENOUS
  Filled 2013-01-28 (×2): qty 250

## 2013-01-28 NOTE — Progress Notes (Addendum)
Patient ID: Evan Perez, male   DOB: 10/19/1922, 79 y.o.   MRN: [id number]   SUBJECTIVE:  Patient is stable. Rhythm continues to be atrial fibrillation. The rate is controlled.   Filed Vitals:   01/27/13 0442 01/27/13 1415 01/27/13 1938 01/28/13 0400  BP: 108/61 114/53 133/64 127/70  Pulse: 71 70 67 61  Temp: 97.6 F (36.4 C) 98 F (36.7 C) 97.8 F (36.6 C) 97.2 F (36.2 C)  TempSrc: Oral Oral Oral Oral  Resp: 18 18 18 18   Height:      Weight:      SpO2: 98% 100% 98% 95%    Intake/Output Summary (Last 24 hours) at 01/28/13 0735 Last data filed at 01/27/13 1751  Gross per 24 hour  Intake    720 ml  Output    375 ml  Net    345 ml    LABS: Basic Metabolic Panel:  Recent Labs  40/98/11 1700  NA 128*  K 4.9  CL 90*  CO2 30  GLUCOSE 87  BUN 36*  CREATININE 1.73*  CALCIUM 9.7   Liver Function Tests:  Recent Labs  01/26/13 1700  AST 24  ALT 18  ALKPHOS 74  BILITOT 0.5  PROT 8.2  ALBUMIN 4.2   No results found for this basename: LIPASE, AMYLASE,  in the last 72 hours CBC:  Recent Labs  01/27/13 0405 01/28/13 0604  WBC 5.5 5.6  HGB 12.3* 12.2*  HCT 35.7* 36.0*  MCV 89.5 89.3  PLT 120* 125*   Cardiac Enzymes: No results found for this basename: CKTOTAL, CKMB, CKMBINDEX, TROPONINI,  in the last 72 hours BNP: No components found with this basename: POCBNP,  D-Dimer: No results found for this basename: DDIMER,  in the last 72 hours Hemoglobin A1C: No results found for this basename: HGBA1C,  in the last 72 hours Fasting Lipid Panel: No results found for this basename: CHOL, HDL, LDLCALC, TRIG, CHOLHDL, LDLDIRECT,  in the last 72 hours Thyroid Function Tests: No results found for this basename: TSH, T4TOTAL, FREET3, T3FREE, THYROIDAB,  in the last 72 hours  RADIOLOGY: Dg Chest Portable 1 View  01/26/2013   *RADIOLOGY REPORT*  Clinical Data: Preop for abdominal aortic aneurysm.  Hypertension. Ex-smoker.  PORTABLE CHEST - 1 VIEW  Comparison:  05/13/2012  Findings: Mild hyperinflation. Prior median sternotomy. Midline trachea.  Mild cardiomegaly with atherosclerosis in the transverse aorta. No pleural effusion or pneumothorax.  Apparent nodular density projecting over the right upper lobe is most likely related to the overlying EKG lead.  There is also left base scarring. Nodular density projects over the right lung base laterally.  IMPRESSION: Cardiomegaly, without acute disease.  Probable artifactual density projecting over the right upper lobe.  Consider repeat frontal radiograph with removal of EKG leads.  Possible nipple shadow projecting at the right lung base.  Consider nipple markers on repeat frontal.   Original Report Authenticated By: Jeronimo Greaves, M.D.   Ct Angio Abd/pel W/ And/or W/o  12/29/2012   *RADIOLOGY REPORT*  Clinical Data:  Abdominal aortic aneurysm.  CT ANGIOGRAPHY ABDOMEN AND PELVIS  Technique:  Multidetector CT imaging of the abdomen and pelvis was performed using the standard protocol during bolus administration of intravenous contrast.  Multiplanar reconstructed images including MIPs were obtained and reviewed to evaluate the vascular anatomy.  Contrast: 60mL OMNIPAQUE IOHEXOL 300 MG/ML  SOLN  intravenously.  Comparison:   None.  Findings:  Visualized lung bases appear normal.  The liver, spleen and pancreas appear  normal.  No gallstones are noted.  Adrenal glands appear normal.  Bilateral cortical scarring of both kidneys is noted.  No hydronephrosis or renal obstruction is noted. Aneurysmal dilatation of the proximal portion of the infrarenal abdominal aorta is noted measuring 4.0 x 3.7 cm in size.  More inferiorly, the aneurysm also measures 4.0 x 3.7 cm.  The celiac artery is widely patent as well as the inferior mesenteric artery. The superior mesenteric artery is patent although a severe eccentric plaque is noted proximally.  Renal arteries are widely patent, although severe stenoses are noted at their origins as well  due to aortic plaques.  Multiple large atherosclerotic ulcers are noted in the proximal infrarenal abdominal aorta.  There is noted either a large chronic dissection or atherosclerotic ulcer involving the left common iliac artery that measures 6.4 x 3.4 cm.  It results in compression of the true lumen of this vessel. Similar abnormality is seen involving the proximal right common iliac artery that measures 3.0 x 2.9 cm in size. The internal and external iliac arteries as well as the common femoral arteries are widely patent as well.   Review of the MIP images confirms the above findings.  IMPRESSION: Infrarenal abdominal aortic aneurysm is noted with maximum measured transverse size of 4.0 x 3.7 cm.  Multiple large atherosclerotic ulcers are seen in this aneurysm.  The mesenteric and renal arteries are patent, although significant stenoses are seen involving the origins of both renal arteries and the superior mesenteric artery.  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  Similar but smaller abnormality is seen involving the proximal right common iliac artery which measures 3.0 x 2.9 cm.  This also results in significant narrowing of the true lumen of the vessel.   Original Report Authenticated By: Lupita Raider.,  M.D.     TELEMETRY: I have reviewed telemetry today January 28, 2013. His atrial fibrillation. The rate is controlled.   ASSESSMENT AND PLAN:     Atrial fibrillation       The rate is controlled    Chronic anticoagulation     Patient is being coumadinized.     Status post tissue aortic valve replacement in the past  As per Dr Yevonne Pax original plan, the patient is to be fully anticoagulated before he goes home.  I have spoken with Dr.Brabham.  Willa Rough 01/28/2013 7:35 AM

## 2013-01-28 NOTE — Progress Notes (Addendum)
[facility] [place]  Subjective  - Doing well, no new complaints.   Objective 127/70 61 97.2 F (36.2 C) (Oral) 18 95%  Intake/Output Summary (Last 24 hours) at 01/28/13 0845 Last data filed at 01/27/13 1751  Gross per 24 hour  Intake    720 ml  Output    375 ml  Net    345 ml    Ambulating independently Heart RRR Lungs CTA  Assessment/Planning: Pre-op for EVAR in am completed    Clinton Gallant [facility] 01/28/2013 8:45 AM --  Laboratory Lab Results:  Recent Labs  01/27/13 0405 01/28/13 0604  WBC 5.5 5.6  HGB 12.3* 12.2*  HCT 35.7* 36.0*  PLT 120* 125*   BMET  Recent Labs  01/26/13 1700  NA 128*  K 4.9  CL 90*  CO2 30  GLUCOSE 87  BUN 36*  CREATININE 1.73*  CALCIUM 9.7    COAG Lab Results  Component Value Date   INR 1.69* 01/28/2013   INR 1.75* 01/26/2013   INR 1.7 01/19/2013   No results found for this basename: PTT      I agree with the above. The patient has been seen and examined. All of his questions were answered for tomorrow's procedure. His INR is slightly elevated this morning at 1.6. I will give him 2.5 mg of vitamin K orally today. I have further discussed his anticoagulation needs with Dr. Myrtis Ser.  He will be continued on heparin until his procedure.  Evan Perez

## 2013-01-28 NOTE — Progress Notes (Signed)
HR 54. Patient remains in chair. Denies any further dizziness. Spoke with Theodore Demark, [place] PA; will continue to monitor. Mamie Levers

## 2013-01-28 NOTE — Progress Notes (Signed)
HR 34-45 with frequent pauses. Patient sitting in chair asymptomatic. Denies chest pain or shortness of breath. Call bell near. Dr. Myrtis Ser notified; no new orders. Will continue to monitor. Mamie Levers

## 2013-01-28 NOTE — Progress Notes (Signed)
I agree with the above. 

## 2013-01-28 NOTE — Progress Notes (Signed)
PHARMACY FOLLOW UP CONSULT NOTE   Pharmacy Consult :  Heparin bridging Indication: Heparin bridging in preparation for aneurysm repair surgery  Dosing Weight: 77 kg  Labs:  Recent Labs  01/26/13 1700 01/27/13 0405 01/27/13 1431 01/28/13 0604  HGB 13.5 12.3*  --  12.2*  HCT 40.6 35.7*  --  36.0*  PLT 134* 120*  --  125*  INR 1.75*  --   --  1.69*  HEPARINUNFRC  --  0.38 0.57 0.63  CREATININE 1.73*  --   --   --    Lab Results  Component Value Date   INR 1.69* 01/28/2013   INR 1.75* 01/26/2013   INR 1.7 01/19/2013   Estimated Creatinine Clearance: 30.5 ml/min (by C-G formula based on Cr of 1.73).  Medications:  Scheduled:  . aspirin EC  81 mg Oral Q M,W,F  . azelastine  1 spray Each Nare BID  . [START ON 01/29/2013] cefUROXime (ZINACEF)  IV  1.5 g Intravenous 30 min Pre-Op  . cloNIDine  0.2 mg Oral BID  . colesevelam  1,250 mg Oral BID WC  . fluticasone  1 spray Each Nare Daily  . hydrochlorothiazide  12.5 mg Oral Daily  . irbesartan  150 mg Oral Daily  . levothyroxine  25 mcg Oral QAC breakfast  . loratadine  10 mg Oral Daily  . multivitamin with minerals  1 tablet Oral Daily  . pantoprazole  40 mg Oral Daily  . phytonadione  2.5 mg Oral Once  . potassium chloride  20-40 mEq Oral Once  . tiotropium  18 mcg Inhalation Daily   Infusions:  . sodium chloride    . sodium chloride    . heparin     Anti-infectives   Start     Dose/Rate Route Frequency Ordered Stop   01/29/13 0600  cefUROXime (ZINACEF) 1.5 g in dextrose 5 % 50 mL IVPB     1.5 g 100 mL/hr over 30 Minutes Intravenous 30 min pre-op 01/26/13 1926     01/26/13 1745  cefUROXime (ZINACEF) 1.5 g in dextrose 5 % 50 mL IVPB  Status:  Discontinued     1.5 g 100 mL/hr over 30 Minutes Intravenous 30 min pre-op 01/26/13 1745 01/26/13 1926     Assessment: 79 y/o male currently on Heparin infusion for bridging to prepare for aneurysm repair surgery later this week.  Heparin rate 1000 units/hr, Heparin level  0.57 units/ml > within therapeutic range. No bleeding complications noted  Goal of Therapy:  Heparin level 0.3-0.7 units/ml   Plan:   Continue Heparin infusion at same rate. Daily Heparin Levels, CBC.  Monitor for bleeding complications   Laurena Bering, Pharm.D.  01/28/2013,9:27 AM

## 2013-01-28 NOTE — Progress Notes (Signed)
HR 32. Patient sitting in chair and denies any distress. Stated he felt dizzy for a moment but it resolved after drinking orange juice. Patient stated he was told in the past by previous MD that he had hypoglycemia. CBG checked; 116 after OJ. Clinton Gallant, PA informed; no new orders. Will continue to monitor. Patient remains in chair. Call bell near.Mamie Levers

## 2013-01-28 NOTE — H&P (Signed)
[facility] [place]  Patient name: Evan Perez MRN: [id number] DOB: [birth year] Sex: male  Referred by: Dr. Patty Sermons  Reason for referral:  Chief Complaint   Patient presents with   .  New Evaluation     AAA - aneurysm common iliac artery   HISTORY OF PRESENT ILLNESS:  This is a very pleasant 79 year old gentleman that I had seen for evaluation of an aortoiliac aneurysm. This was just detected on ultrasound. 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 The patient is without abdominal or back pain. He is very active.  The patient has history of hypertension which is treated medically. He also suffers from coronary artery disease. He is status post coronary artery bypass grafting in November of 2011. He had aortic valve replacement with a by a prosthetic valve. He is taking Coumadin for atrial fibrillation. He takes a statin 4 hypercholesterolemia.  Past Medical History   Diagnosis  Date   .  Aortic valve disorder    .  AAA (abdominal aortic aneurysm)      CHRONIC AFIB   .  Hypertension    .  Fall      FRACTURE VERTEBRAL 2011   .  CHF (congestive heart failure)      LV DYSFUNCTION -EF 55% JAN 2012   .  Atrial fibrillation    .  Cancer     Past Surgical History   Procedure  Laterality  Date   .  Aortic valve replacement       NOV 2011-TISSUE VALVE, 23 mm Mitroflow aortic pericardial heart valve   .  Cardiac catheterization       03/2010-non obstructive CAD; severe AS   .  Vasectomy     .  Tonsillectomy      History    Social History   .  Marital Status:  Widowed     Spouse Name:  N/A     Number of Children:  N/A   .  Years of Education:  N/A    Occupational History   .  Not on file.    Social History Main Topics   .  Smoking status:  Former Smoker     Quit date:  06/04/1988   .  Smokeless tobacco:  Never Used   .  Alcohol Use:  1.2 - 3 oz/week     1-3 Glasses of wine, 1-2 Shots of liquor per week   .  Drug Use:  No   .  Sexually Active:   Not on file    Other Topics  Concern   .  Not on file    Social History Narrative   .  No narrative on file    Family History   Problem  Relation  Age of Onset   .  Hypertension  Mother    .  Heart disease  Mother    .  Heart attack  Mother    .  Hypertension  Father    .  Heart disease  Father    .  Heart disease  Brother     Allergies as of 12/15/2012 - Review Complete 12/15/2012   Allergen  Reaction  Noted   .  Dust mite extract   05/14/2012   .  Lipitor (atorvastatin calcium)   09/12/2010   .  Ramipril   09/12/2010   .  Zocor (simvastatin)   09/12/2010    Current Outpatient Prescriptions on File Prior to Visit  Medication  Sig  Dispense  Refill   .  aspirin 81 MG tablet  Take 81 mg by mouth daily. Take 3 x week     .  azelastine (ASTELIN) 137 MCG/SPRAY nasal spray  Place 1 spray into the nose 2 (two) times daily. Use in each nostril as directed     .  cloNIDine (CATAPRES) 0.1 MG tablet  Take 2 tablets (0.2 mg total) by mouth 2 (two) times daily.  120 tablet  5   .  colesevelam (WELCHOL) 625 MG tablet  Take 1,875 mg by mouth 2 (two) times daily with a meal.     .  guaiFENesin (MUCINEX) 600 MG 12 hr tablet  Take 1,200 mg by mouth 2 (two) times daily as needed. For congestion.     Marland Kitchen  ipratropium (ATROVENT) 0.03 % nasal spray  Place 2 sprays into the nose 2 (two) times daily.     Marland Kitchen  levothyroxine (SYNTHROID, LEVOTHROID) 25 MCG tablet  Take 1 tablet (25 mcg total) by mouth daily.  30 tablet  11   .  loratadine (CLARITIN) 10 MG tablet  Take 10 mg by mouth daily.     .  mometasone (NASONEX) 50 MCG/ACT nasal spray  Place 2 sprays into the nose daily.     .  multivitamin (THERAGRAN) per tablet  Take 1 tablet by mouth daily.     Marland Kitchen  OVER THE COUNTER MEDICATION  as needed. neilmed nasal wash     .  valsartan-hydrochlorothiazide (DIOVAN HCT) 160-12.5 MG per tablet  Take 1 tablet by mouth daily.  30 tablet  5   .  warfarin (COUMADIN) 5 MG tablet  Take 1 tablet (5 mg total) by mouth as  directed.  40 tablet  3    No current facility-administered medications on file prior to visit.   REVIEW OF SYSTEMS:  Cardiovascular: No chest pain, chest pressure, palpitations, orthopnea. No claudication or rest pain, No history of DVT or phlebitis. Positive for shortness of breath with exertion and atrial fibrillation  Pulmonary: No productive cough, asthma or wheezing.  Neurologic: No weakness, paresthesias, aphasia, or amaurosis. No dizziness.  Hematologic: No bleeding problems or clotting disorders.  Musculoskeletal: No joint pain or joint swelling.  Gastrointestinal: No blood in stool or hematemesis  Genitourinary: No dysuria or hematuria.  Psychiatric:: No history of major depression.  Integumentary: No rashes or ulcers.  Constitutional: No fever or chills.  PHYSICAL EXAMINATION:  General: The patient appears their stated age. Vital signs are BP 133/70  Pulse 83  Ht 5\' 10"  (1.778 m)  Wt 170 lb (77.111 kg)  BMI 24.39 kg/m2  SpO2 96%  HEENT: No gross abnormalities  Pulmonary: Respirations are non-labored  Abdomen: Soft and non-tender.  Musculoskeletal: There are no major deformities.  Neurologic: No focal weakness or paresthesias are detected,  Skin: There are no ulcer or rashes noted.  Psychiatric: The patient has normal affect.  Cardiovascular: There is a regular rate and rhythm without significant murmur appreciated. No carotid bruits. Palpable femoral pulses bilaterally  Diagnostic Studies:  Patient has an outside ultrasound which shows an aortic and left iliac aneurysm  Assessment:  Aortic and left iliac aneurysm  Plan:  I discussed with the patient that in order to determine the exact dimensions of his aneurysmal disease as well as to determine the best options for repair, he will need to undergo CT angiography. I have scheduled this for 2 weeks from today. I will see him following his CAT  scan. In addition I'm going to obtain carotid Doppler studies for preoperative  evaluation.   The patient has a large > 6cm left iliac aneurysm.  He has undergone embolization of his left internal iliac artery.  He developed a severe reaction to lovenox.  He was admitted for heparin bridging at the request of his cardiologist.  The plan is for endovascular repair.  I am intentionally leaving a supra renal aneurysm which only measures 4cm.  In order to repair this, it would require a very complex repair, likely an open procedure, therefore, I am only fixing the infrarenal component.     Jorge Ny, M.D.  [facility] [place]  Office: [phone]  Pager: [phone]

## 2013-01-29 ENCOUNTER — Encounter (HOSPITAL_COMMUNITY): Payer: Self-pay | Admitting: Certified Registered"

## 2013-01-29 ENCOUNTER — Inpatient Hospital Stay (HOSPITAL_COMMUNITY): Payer: Medicare Other | Admitting: Anesthesiology

## 2013-01-29 ENCOUNTER — Encounter (HOSPITAL_COMMUNITY): Payer: Self-pay | Admitting: Anesthesiology

## 2013-01-29 ENCOUNTER — Inpatient Hospital Stay (HOSPITAL_COMMUNITY): Payer: Medicare Other

## 2013-01-29 ENCOUNTER — Encounter (HOSPITAL_COMMUNITY)
Admission: AD | Disposition: A | Discharge status: Home / Self Care | Payer: Self-pay | Source: Ambulatory Visit | Attending: Surgery

## 2013-01-29 ENCOUNTER — Other Ambulatory Visit: Payer: Self-pay | Admitting: *Deleted

## 2013-01-29 ENCOUNTER — Inpatient Hospital Stay (HOSPITAL_COMMUNITY): Admission: RE | Admit: 2013-01-29 | Payer: Medicare Other | Source: Ambulatory Visit | Admitting: Surgery

## 2013-01-29 DIAGNOSIS — Z7901 Long term (current) use of anticoagulants: Secondary | ICD-10-CM

## 2013-01-29 DIAGNOSIS — Z48812 Encounter for surgical aftercare following surgery on the circulatory system: Secondary | ICD-10-CM

## 2013-01-29 DIAGNOSIS — I714 Abdominal aortic aneurysm, without rupture: Secondary | ICD-10-CM

## 2013-01-29 HISTORY — PX: ABDOMINAL AORTIC ENDOVASCULAR STENT GRAFT: SHX5707

## 2013-01-29 LAB — CBC
HCT: 33.9 % — ABNORMAL LOW (ref 39.0–52.0)
Hemoglobin: 11.8 g/dL — ABNORMAL LOW (ref 13.0–17.0)
Hemoglobin: 11.6 g/dL — ABNORMAL LOW (ref 13.0–17.0)
MCH: 30.4 pg (ref 26.0–34.0)
MCH: 30.3 pg (ref 26.0–34.0)
MCV: 88.5 fL (ref 78.0–100.0)
Platelets: 100 10*3/uL — ABNORMAL LOW (ref 150–400)
RBC: 3.88 MIL/uL — ABNORMAL LOW (ref 4.22–5.81)
RBC: 3.83 MIL/uL — ABNORMAL LOW (ref 4.22–5.81)
WBC: 4.8 10*3/uL (ref 4.0–10.5)

## 2013-01-29 LAB — BASIC METABOLIC PANEL
BUN: 33 mg/dL — ABNORMAL HIGH (ref 6–23)
BUN: 29 mg/dL — ABNORMAL HIGH (ref 6–23)
CO2: 26 mEq/L (ref 19–32)
Calcium: 8.7 mg/dL (ref 8.4–10.5)
Chloride: 92 mEq/L — ABNORMAL LOW (ref 96–112)
Chloride: 95 mEq/L — ABNORMAL LOW (ref 96–112)
Creatinine, Ser: 1.71 mg/dL — ABNORMAL HIGH (ref 0.50–1.35)
GFR calc Af Amer: 39 mL/min — ABNORMAL LOW (ref >90)
Glucose, Bld: 101 mg/dL — ABNORMAL HIGH (ref 70–99)
Potassium: 4 mEq/L (ref 3.5–5.1)

## 2013-01-29 LAB — PROTIME-INR
INR: 1.4 (ref 0.00–1.49)
Prothrombin Time: 16.8 seconds — ABNORMAL HIGH (ref 11.6–15.2)

## 2013-01-29 LAB — MAGNESIUM: Magnesium: 2 mg/dL (ref 1.5–2.5)

## 2013-01-29 SURGERY — INSERTION, ENDOVASCULAR STENT GRAFT, AORTA, ABDOMINAL
Anesthesia: General | Wound class: Clean

## 2013-01-29 MED ORDER — GLYCOPYRROLATE 0.2 MG/ML IJ SOLN
INTRAMUSCULAR | Status: DC | PRN
  Administered 2013-01-29: 0.2 mg via INTRAVENOUS
  Administered 2013-01-29: 0.4 mg via INTRAVENOUS

## 2013-01-29 MED ORDER — HEPARIN (PORCINE) IN NACL 100-0.45 UNIT/ML-% IJ SOLN
850.0000 [IU]/h | INTRAMUSCULAR | Status: DC
  Administered 2013-01-29: 850 [IU]/h via INTRAVENOUS
  Administered 2013-01-30: 900 [IU]/h via INTRAVENOUS
  Administered 2013-01-31: 850 [IU]/h via INTRAVENOUS
  Filled 2013-01-29 (×4): qty 250

## 2013-01-29 MED ORDER — ONDANSETRON HCL 4 MG/2ML IJ SOLN
INTRAMUSCULAR | Status: DC | PRN
  Administered 2013-01-29: 4 mg via INTRAVENOUS

## 2013-01-29 MED ORDER — SODIUM CHLORIDE 0.9 % IR SOLN
Status: DC | PRN
  Administered 2013-01-29: 09:00:00

## 2013-01-29 MED ORDER — DOPAMINE-DEXTROSE 3.2-5 MG/ML-% IV SOLN: 3.0000 ug/kg/min | INTRAVENOUS | Status: DC

## 2013-01-29 MED ORDER — SUFENTANIL CITRATE 50 MCG/ML IV SOLN
INTRAVENOUS | Status: DC | PRN
  Administered 2013-01-29 (×2): 10 ug via INTRAVENOUS
  Administered 2013-01-29: 30 ug via INTRAVENOUS

## 2013-01-29 MED ORDER — SODIUM CHLORIDE 0.9 % IV SOLN: 500.0000 mL | Freq: Once | INTRAVENOUS | Status: AC | PRN

## 2013-01-29 MED ORDER — OXYCODONE HCL 5 MG PO TABS: 5.0000 mg | ORAL_TABLET | Freq: Four times a day (QID) | ORAL | Status: DC | PRN

## 2013-01-29 MED ORDER — LACTATED RINGERS IV SOLN
INTRAVENOUS | Status: DC | PRN
  Administered 2013-01-29 (×2): via INTRAVENOUS

## 2013-01-29 MED ORDER — LABETALOL HCL 5 MG/ML IV SOLN
10.0000 mg | INTRAVENOUS | Status: DC | PRN
  Filled 2013-01-29: qty 4

## 2013-01-29 MED ORDER — SODIUM CHLORIDE 0.9 % IV SOLN
INTRAVENOUS | Status: DC
  Administered 2013-01-29: 1000 mL via INTRAVENOUS

## 2013-01-29 MED ORDER — ACETAMINOPHEN 650 MG RE SUPP: 325.0000 mg | RECTAL | Status: DC | PRN

## 2013-01-29 MED ORDER — ONDANSETRON HCL 4 MG/2ML IJ SOLN: 4.0000 mg | Freq: Four times a day (QID) | INTRAMUSCULAR | Status: DC | PRN

## 2013-01-29 MED ORDER — POTASSIUM CHLORIDE CRYS ER 20 MEQ PO TBCR: 20.0000 meq | EXTENDED_RELEASE_TABLET | Freq: Once | ORAL | Status: AC | PRN

## 2013-01-29 MED ORDER — NEOSTIGMINE METHYLSULFATE 1 MG/ML IJ SOLN
INTRAMUSCULAR | Status: DC | PRN
  Administered 2013-01-29: 3 mg via INTRAVENOUS

## 2013-01-29 MED ORDER — PROPOFOL 10 MG/ML IV BOLUS
INTRAVENOUS | Status: DC | PRN
  Administered 2013-01-29: 180 mg via INTRAVENOUS

## 2013-01-29 MED ORDER — ALUM & MAG HYDROXIDE-SIMETH 200-200-20 MG/5ML PO SUSP: 15.0000 mL | ORAL | Status: DC | PRN

## 2013-01-29 MED ORDER — HEPARIN SODIUM (PORCINE) 1000 UNIT/ML IJ SOLN
INTRAMUSCULAR | Status: DC | PRN
  Administered 2013-01-29: 7000 [IU] via INTRAVENOUS

## 2013-01-29 MED ORDER — ZOLPIDEM TARTRATE 5 MG PO TABS: 5.0000 mg | ORAL_TABLET | Freq: Every evening | ORAL | Status: DC | PRN

## 2013-01-29 MED ORDER — DEXTROSE 5 % IV SOLN
1.5000 g | Freq: Two times a day (BID) | INTRAVENOUS | Status: AC
  Administered 2013-01-29 – 2013-01-30 (×2): 1.5 g via INTRAVENOUS
  Filled 2013-01-29 (×2): qty 1.5

## 2013-01-29 MED ORDER — PROTAMINE SULFATE 10 MG/ML IV SOLN
INTRAVENOUS | Status: DC | PRN
  Administered 2013-01-29: 50 mg via INTRAVENOUS

## 2013-01-29 MED ORDER — METOPROLOL TARTRATE 1 MG/ML IV SOLN: 2.0000 mg | INTRAVENOUS | Status: DC | PRN

## 2013-01-29 MED ORDER — DEXAMETHASONE SODIUM PHOSPHATE 4 MG/ML IJ SOLN
INTRAMUSCULAR | Status: DC | PRN
  Administered 2013-01-29: 4 mg via INTRAVENOUS

## 2013-01-29 MED ORDER — PHENOL 1.4 % MT LIQD: 1.0000 | OROMUCOSAL | Status: DC | PRN

## 2013-01-29 MED ORDER — LIDOCAINE HCL (CARDIAC) 20 MG/ML IV SOLN
INTRAVENOUS | Status: DC | PRN
  Administered 2013-01-29: 100 mg via INTRAVENOUS

## 2013-01-29 MED ORDER — ROCURONIUM BROMIDE 100 MG/10ML IV SOLN
INTRAVENOUS | Status: DC | PRN
  Administered 2013-01-29: 50 mg via INTRAVENOUS

## 2013-01-29 MED ORDER — DOCUSATE SODIUM 100 MG PO CAPS
100.0000 mg | ORAL_CAPSULE | Freq: Every day | ORAL | Status: DC
  Administered 2013-01-30 – 2013-02-06 (×8): 100 mg via ORAL
  Filled 2013-01-29 (×8): qty 1

## 2013-01-29 MED ORDER — ACETAMINOPHEN 325 MG PO TABS: 325.0000 mg | ORAL_TABLET | ORAL | Status: DC | PRN

## 2013-01-29 MED ORDER — MORPHINE SULFATE 2 MG/ML IJ SOLN: 2.0000 mg | INTRAMUSCULAR | Status: DC | PRN

## 2013-01-29 MED ORDER — HYDRALAZINE HCL 20 MG/ML IJ SOLN
10.0000 mg | INTRAMUSCULAR | Status: DC | PRN
  Administered 2013-02-04: 10 mg via INTRAVENOUS
  Filled 2013-01-29: qty 1

## 2013-01-29 MED ORDER — PANTOPRAZOLE SODIUM 40 MG PO TBEC
40.0000 mg | DELAYED_RELEASE_TABLET | Freq: Every day | ORAL | Status: DC
  Administered 2013-01-29 – 2013-02-06 (×8): 40 mg via ORAL
  Filled 2013-01-29 (×7): qty 1

## 2013-01-29 MED ORDER — EPHEDRINE SULFATE 50 MG/ML IJ SOLN
INTRAMUSCULAR | Status: DC | PRN
  Administered 2013-01-29 (×2): 10 mg via INTRAVENOUS

## 2013-01-29 MED ORDER — IODIXANOL 320 MG/ML IV SOLN
INTRAVENOUS | Status: DC | PRN
  Administered 2013-01-29: 73.07 mL via INTRAVENOUS

## 2013-01-29 MED ORDER — DEXTROSE 5 % IV SOLN
1.5000 g | INTRAVENOUS | Status: AC
  Administered 2013-01-29: 1.5 g via INTRAVENOUS
  Filled 2013-01-29: qty 1.5

## 2013-01-29 MED ORDER — OXYCODONE HCL 5 MG PO TABS: 5.0000 mg | ORAL_TABLET | ORAL | Status: DC | PRN

## 2013-01-29 MED ORDER — 0.9 % SODIUM CHLORIDE (POUR BTL) OPTIME
TOPICAL | Status: DC | PRN
  Administered 2013-01-29: 1000 mL

## 2013-01-29 SURGICAL SUPPLY — 78 items
ADH SKN CLS APL DERMABOND .7 (GAUZE/BANDAGES/DRESSINGS) ×1
BAG BANDED W/RUBBER/TAPE 36X54 (MISCELLANEOUS) ×3 IMPLANT
BAG EQP BAND 135X91 W/RBR TAPE (MISCELLANEOUS) ×1
BAG SNAP BAND KOVER 36X36 (MISCELLANEOUS) ×6 IMPLANT
BLADE SURG ROTATE 9660 (MISCELLANEOUS) ×1 IMPLANT
CANISTER SUCTION 2500CC (MISCELLANEOUS) ×2 IMPLANT
CATH BEACON 5.038 65CM KMP-01 (CATHETERS) ×1 IMPLANT
CATH OMNI FLUSH .035X70CM (CATHETERS) ×1 IMPLANT
CATH VANSCH 5FR 6CM (CATHETERS) ×1 IMPLANT
CLIP TI MEDIUM 24 (CLIP) IMPLANT
CLIP TI WIDE RED SMALL 24 (CLIP) IMPLANT
CLOTH BEACON ORANGE TIMEOUT ST (SAFETY) ×2 IMPLANT
COVER DOME SNAP 22 D (MISCELLANEOUS) ×2 IMPLANT
COVER MAYO STAND STRL (DRAPES) ×2 IMPLANT
COVER PROBE W GEL 5X96 (DRAPES) ×2 IMPLANT
COVER SURGICAL LIGHT HANDLE (MISCELLANEOUS) ×2 IMPLANT
DERMABOND ADVANCED (GAUZE/BANDAGES/DRESSINGS) ×1
DERMABOND ADVANCED .7 DNX12 (GAUZE/BANDAGES/DRESSINGS) ×1 IMPLANT
DEVICE CLOSURE PERCLS PRGLD 6F (VASCULAR PRODUCTS) IMPLANT
DEVICE TORQUE H2O (MISCELLANEOUS) ×1 IMPLANT
DRAPE TABLE COVER HEAVY DUTY (DRAPES) ×2 IMPLANT
DRESSING OPSITE X SMALL 2X3 (GAUZE/BANDAGES/DRESSINGS) ×4 IMPLANT
ELECT REM PT RETURN 9FT ADLT (ELECTROSURGICAL) ×4
ELECTRODE REM PT RTRN 9FT ADLT (ELECTROSURGICAL) ×2 IMPLANT
EXCLUDER TNK 28.5X14.5X18CM (Endovascular Graft) IMPLANT
EXCLUDER TRUNK 28.5X14.5X18CM (Endovascular Graft) ×2 IMPLANT
GAUZE SPONGE 2X2 8PLY STRL LF (GAUZE/BANDAGES/DRESSINGS) ×2 IMPLANT
GLOVE BIO SURGEON STRL SZ 6.5 (GLOVE) ×2 IMPLANT
GLOVE BIO SURGEON STRL SZ7.5 (GLOVE) ×2 IMPLANT
GLOVE BIOGEL PI IND STRL 7.0 (GLOVE) IMPLANT
GLOVE BIOGEL PI IND STRL 7.5 (GLOVE) ×1 IMPLANT
GLOVE BIOGEL PI INDICATOR 7.0 (GLOVE) ×1
GLOVE BIOGEL PI INDICATOR 7.5 (GLOVE) ×1
GLOVE ECLIPSE 6.5 STRL STRAW (GLOVE) ×1 IMPLANT
GLOVE SURG SS PI 7.5 STRL IVOR (GLOVE) ×2 IMPLANT
GOWN PREVENTION PLUS XXLARGE (GOWN DISPOSABLE) ×2 IMPLANT
GOWN STRL NON-REIN LRG LVL3 (GOWN DISPOSABLE) ×5 IMPLANT
GRAFT BALLN CATH 65CM (STENTS) IMPLANT
GUIDEWIRE ANGLED .035X150CM (WIRE) ×1 IMPLANT
HEMOSTAT SNOW SURGICEL 2X4 (HEMOSTASIS) IMPLANT
KIT BASIN OR (CUSTOM PROCEDURE TRAY) ×2 IMPLANT
KIT ROOM TURNOVER OR (KITS) ×2 IMPLANT
LEG CONTRALATERAL 20X11.5 (Vascular Products) ×2 IMPLANT
NDL PERC 18GX7CM (NEEDLE) ×1 IMPLANT
NEEDLE PERC 18GX7CM (NEEDLE) ×2 IMPLANT
NS IRRIG 1000ML POUR BTL (IV SOLUTION) ×2 IMPLANT
PACK AORTA (CUSTOM PROCEDURE TRAY) ×2 IMPLANT
PAD ARMBOARD 7.5X6 YLW CONV (MISCELLANEOUS) ×4 IMPLANT
PENCIL BUTTON HOLSTER BLD 10FT (ELECTRODE) IMPLANT
PERCLOSE PROGLIDE 6F (VASCULAR PRODUCTS) ×10
PROTECTION STATION PRESSURIZED (MISCELLANEOUS) ×2
SHEATH AVANTI 11CM 8FR (MISCELLANEOUS) ×1 IMPLANT
SHEATH BRITE TIP 8FR 23CM (MISCELLANEOUS) ×1 IMPLANT
SLEEVE SURGEON STRL (DRAPES) ×1 IMPLANT
SPONGE GAUZE 2X2 STER 10/PKG (GAUZE/BANDAGES/DRESSINGS) ×1
STAPLER VISISTAT 35W (STAPLE) IMPLANT
STATION PROTECTION PRESSURIZED (MISCELLANEOUS) IMPLANT
STENT GRAFT BALLN CATH 65CM (STENTS) ×1
STENT GRAFT CONTRALAT 20X11.5 (Vascular Products) IMPLANT
STOPCOCK MORSE 400PSI 3WAY (MISCELLANEOUS) ×2 IMPLANT
SUT ETHILON 3 0 PS 1 (SUTURE) IMPLANT
SUT PROLENE 5 0 C 1 24 (SUTURE) IMPLANT
SUT VIC AB 2-0 CT1 27 (SUTURE)
SUT VIC AB 2-0 CT1 TAPERPNT 27 (SUTURE) IMPLANT
SUT VIC AB 3-0 SH 27 (SUTURE)
SUT VIC AB 3-0 SH 27X BRD (SUTURE) IMPLANT
SUT VICRYL 4-0 PS2 18IN ABS (SUTURE) ×4 IMPLANT
SYR 20CC LL (SYRINGE) ×4 IMPLANT
SYR 30ML LL (SYRINGE) IMPLANT
SYR 5ML LL (SYRINGE) IMPLANT
SYR MEDRAD MARK V 150ML (SYRINGE) ×2 IMPLANT
SYRINGE 10CC LL (SYRINGE) ×6 IMPLANT
TOWEL OR 17X24 6PK STRL BLUE (TOWEL DISPOSABLE) ×4 IMPLANT
TOWEL OR 17X26 10 PK STRL BLUE (TOWEL DISPOSABLE) ×4 IMPLANT
TRAY FOLEY CATH 16FRSI W/METER (SET/KITS/TRAYS/PACK) ×2 IMPLANT
TUBING HIGH PRESSURE 120CM (CONNECTOR) ×2 IMPLANT
WIRE AMPLATZ SS-J .035X180CM (WIRE) ×2 IMPLANT
WIRE BENTSON .035X145CM (WIRE) ×3 IMPLANT

## 2013-01-29 NOTE — Op Note (Signed)
[facility] [place]  Patient name: Evan Perez MRN: [id number] DOB: [birth year] Sex: male  01/26/2013 - 01/29/2013 Pre-operative Diagnosis: Left iliac and abdominal aortic aneurysm Post-operative diagnosis:  Same Surgeon:  Jorge Ny Assistants:  Doreatha Massed Procedure:   #1: Endovascular repair of abdominal aortic and left iliac aneurysm   #2: Ultrasound-guided access, bilateral common femoral artery   #3: Catheter and aorta x2   #4: Abdominal aortogram Anesthesia:  Gen. Blood Loss:  See anesthesia record Specimens:  None  Findings:  Successful exclusion of left common iliac aneurysm. Questionable type IA leak which did not compromise to seal on the iliac aneurysm which was the indication for repair Devices used: Main body was a Biomedical scientist primary left 28 x 14 x 18. Contralateral right was a 20 x 11.5  Indications:  The patient was found to have a large iliac aneurysm on the left. He also has an abdominal aortic aneurysm which extends up above his accessory renal arteries. He has accessory renal arteries bilaterally which provided blood flow to approximately 50% of each kidney. He preoperatively underwent embolization of his left hypogastric artery in anticipation of coverage. Plan was for deployment of the main body below his accessory renal on the right, leaving aneurysmal aorta about this, however maximum diameter at this time is only 4 cm. Given the patient's age I felt that this was the best option rather than a complex possibly open repair to preserve the accessory renal arteries.  Procedure:  The patient was identified in the holding area and taken to [facility] OR ROOM 16  The patient was then placed supine on the table. general anesthesia was administered.  The patient was prepped and draped in the usual sterile fashion.  A time out was called and antibiotics were administered.  Ultrasound was used to evaluate bilateral femoral arteries. They were widely  patent. A #11 blade was used to make a skin neck bilaterally. An 18-gauge needle was used to cannulate bilateral common femoral arteries under ultrasound guidance. An 035 wires were advanced into the aorta. The subcutaneous tract was dilated with an 8 [place] dilator. Pro-glide devices were deployed at the 11:00 and 1:00 position for pre-closure. 8 French sheaths were placed bilaterally. The patient was fully heparinized. A wire exchange was performed bilaterally and Amplatz superstiff wires were deployed into the distal thoracic aorta. A 12 French sheath was placed on the right and a 18 French sheath placed on the left. An omni-flush catheter was then positioned at L3 and an abdominal aortogram was performed. The main body device was prepared on the back table. This was a Biomedical scientist 28 x 14 x 18. It was advanced to the left side and positioned just below the accessory right renal artery. It was then deployed down to the contralateral gate. Next I tried to cannulate the contralateral gate but had difficulty and therefore I ended up reconstructing the proximal portion of the main body and rotating it so that it may the gate easy her to cannulate. I was then able to cannulate the gate with a Glidewire and a Omni flush catheter. The Omni flush catheter was able to be advanced into the main body portion and freely rotated, confirming successful cannulation. I also made sure that there were no twists or change within the ipsilateral limb that had been rotated. Next, the image detector was placed in a left anterior oblique position. A retrograde injection was performed to the right groin, identifying the  right hypogastric artery. The contralateral limb was a 20 x 11.5 Gore Excluder. It was then advanced to the 12 French sheath, positioned at the takeoff of the hypogastric artery and then successfully deployed. I then performed a retrograde injection on the left. This showed that the embolization was successful. I then  deployed the remaining portion of the ipsilateral limb. I measured a slightly greater than 2 cm distal landing zone. I then used the Q.-50 balloon to mold the device overlapping as well as the proximal and distal attachment sites. A completion arteriogram was then performed. This showed successful exclusion of the left common iliac aneurysm. I was concerned about a possible type XVIII endoleak, and therefore I re\re ballooned the proximal portion of the device. Another arteriogram was performed that showed that there potentially was a 18 endoleak however this could also be from a lumbar type II at this area versus the ulcer just above. I did not feel that further intervention was needed at this time. The stiff wires were exchanged out for [facility] wires. The sheaths were then removed and the arteriotomy sites closed by securing the pro-glide devices. 50 mg of protamine was administered. The skin incisions were closed with 4-0 Vicryl. Dermabond was applied. The patient had Doppler signals in his lower sternum a similar to preop. He is taken the recovery in stable condition. There were no complications   Disposition:  To PACU in stable condition.   Juleen China, M.D. [facility] [place] Office: [phone] Pager:  [phone]

## 2013-01-29 NOTE — Transfer of Care (Signed)
Immediate Anesthesia Transfer of Care Note  Patient: Evan Perez  Procedure(s) Performed: Procedure(s): ABDOMINAL AORTIC ENDOVASCULAR STENT GRAFT - GORE; ULTRASOUND GUIDED (N/A)  Patient Location: PACU  Anesthesia Type:General  Level of Consciousness: oriented, sedated, patient cooperative and responds to stimulation  Airway & Oxygen Therapy: Patient Spontanous Breathing and Patient connected to nasal cannula oxygen  Post-op Assessment: Report given to PACU RN, Post -op Vital signs reviewed and stable and Patient moving all extremities X 4  Post vital signs: Reviewed and stable  Complications: No apparent anesthesia complications

## 2013-01-29 NOTE — Progress Notes (Addendum)
Clinical Social Worker received referral for placement back to facility. CSW reviewed chart and noticed plan is for [facility] to return back to [facility]. CSW signing off for now. Please re consult if CSW needs arise.  Maree Krabbe, MSW, [facility] [phone]

## 2013-01-29 NOTE — Progress Notes (Signed)
ANTICOAGULATION CONSULT NOTE - Follow Up Consult  Pharmacy Consult for heparin Indication: bridge for vascular surgery  Allergies  Allergen Reactions  . Dust Mite Extract   . Lipitor [Atorvastatin Calcium] Other (See Comments)    Nervousness     . Ramipril Other (See Comments)    Pt cannot remember reaction   . Zocor [Simvastatin] Other (See Comments)    Nervousness   . Lovenox [Enoxaparin Sodium] Rash    Rash on abdomen, back, lower legs.    Patient Measurements: Height: 5\' 10"  (177.8 cm) Weight: 169 lb 15.6 oz (77.1 kg) IBW/kg (Calculated) : 73 Heparin Dosing Weight:  77kg  Vital Signs: Temp: 97.8 F (36.6 C) (08/28 1200) Temp src: Oral (08/28 0519) BP: 145/67 mmHg (08/28 1226) Pulse Rate: 70 (08/28 1226)  Labs:  Recent Labs  01/26/13 1700  01/27/13 1431 01/28/13 0604 01/29/13 0415 01/29/13 1053  HGB 13.5  < >  --  12.2* 11.8* 11.6*  HCT 40.6  < >  --  36.0* 34.3* 33.9*  PLT 134*  < >  --  125* 108* 100*  APTT 34  --   --   --   --  33  LABPROT 19.9*  --   --  19.4* 16.8* 16.3*  INR 1.75*  --   --  1.69* 1.40 1.34  HEPARINUNFRC  --   < > 0.57 0.63 0.90*  --   CREATININE 1.73*  --   --   --  1.74* 1.71*  < > = values in this interval not displayed.  Estimated Creatinine Clearance: 30.8 ml/min (by C-G formula based on Cr of 1.71).  Assessment: Heparin level 0.90 this am. Patient is s/p surgery for repair of abdominal aortic aneurysm.  He has a PMH of AFIB, tissue valve replacement for which he is on chronic Warfarin therapy.  His home regimen of Warfarin is 5 mg daily except 7.5 mg on Mondays and Fridays.  He has been on IV heparin as bridge therapy and this will be resumed this evening at [facility].  I will reduce the rate to keep him within desired range since he was above goal on current rate of 1000 units/hr.  Goal of Therapy:  Heparin level 0.3-0.7 units/ml   Plan:  1.  Resume heparin at 850 units/hr 2.  Obtain a heparin level 8 hours after starting 3.   Daily heparin levels and CBC 4.  Monitor for bleeding complications    Nadara Mustard, PharmD., MS Clinical Pharmacist Pager:  [phone] Thank you for allowing pharmacy to be part of this patients care team. 01/29/2013,1:16 PM

## 2013-01-29 NOTE — Progress Notes (Signed)
Report given to robin robers rn as caregiver 

## 2013-01-29 NOTE — Anesthesia Preprocedure Evaluation (Addendum)
Anesthesia Evaluation  Patient identified by MRN, date of birth, ID band Patient awake    Reviewed: Allergy & Precautions, H&P , NPO status , Patient's Chart, lab work & pertinent test results, reviewed documented beta blocker date and time   Airway       Dental   Pulmonary shortness of breath and with exertion,          Cardiovascular hypertension, Pt. on medications + dysrhythmias Atrial Fibrillation  Status post AVR 2011   Neuro/Psych negative neurological ROS  negative psych ROS   GI/Hepatic negative GI ROS, Neg liver ROS,   Endo/Other  negative endocrine ROS  Renal/GU negative Renal ROS   Hx BPH    Musculoskeletal negative musculoskeletal ROS (+)   Abdominal   Peds  Hematology negative hematology ROS (+)   Anesthesia Other Findings   Reproductive/Obstetrics                        Anesthesia Physical Anesthesia Plan  ASA: III  Anesthesia Plan: General   Post-op Pain Management:    Induction: Intravenous  Airway Management Planned: Oral ETT  Additional Equipment: Arterial line  Intra-op Plan:   Post-operative Plan: Extubation in OR  Informed Consent: I have reviewed the patients History and Physical, chart, labs and discussed the procedure including the risks, benefits and alternatives for the proposed anesthesia with the patient or authorized representative who has indicated his/her understanding and acceptance.   Dental advisory given  Plan Discussed with: Anesthesiologist and Surgeon  Anesthesia Plan Comments:         Anesthesia Quick Evaluation

## 2013-01-29 NOTE — Progress Notes (Signed)
ANTICOAGULATION CONSULT NOTE - Follow Up Consult  Pharmacy Consult for heparin Indication: bridge for vascular surgery  Allergies  Allergen Reactions  . Dust Mite Extract   . Lipitor [Atorvastatin Calcium] Other (See Comments)    Nervousness     . Ramipril Other (See Comments)    Pt cannot remember reaction   . Zocor [Simvastatin] Other (See Comments)    Nervousness   . Lovenox [Enoxaparin Sodium] Rash    Rash on abdomen, back, lower legs.     Patient Measurements: Height: 5\' 10"  (177.8 cm) Weight: 169 lb 15.6 oz (77.1 kg) IBW/kg (Calculated) : 73 Heparin Dosing Weight:   Vital Signs: Temp: 97.9 F (36.6 C) (08/28 0519) Temp src: Oral (08/28 0519) BP: 107/49 mmHg (08/28 0519) Pulse Rate: 63 (08/28 0519)  Labs:  Recent Labs  01/26/13 1700  01/27/13 0405 01/27/13 1431 01/28/13 0604 01/29/13 0415  HGB 13.5  --  12.3*  --  12.2* 11.8*  HCT 40.6  --  35.7*  --  36.0* 34.3*  PLT 134*  --  120*  --  125* PENDING  APTT 34  --   --   --   --   --   LABPROT 19.9*  --   --   --  19.4* 16.8*  INR 1.75*  --   --   --  1.69* 1.40  HEPARINUNFRC  --   < > 0.38 0.57 0.63 0.90*  CREATININE 1.73*  --   --   --   --  1.74*  < > = values in this interval not displayed.  Estimated Creatinine Clearance: 30.3 ml/min (by C-G formula based on Cr of 1.74).   Medications:    Assessment: Heparin level 0.90 this am. Patient is on schedule for OR at 0715.  Goal of Therapy:  Heparin level 0.3-0.7 units/ml    Plan:  Hold heparin now for 0715 vascular surgery case. With age and current level of 0.90 will be therapeutic for >1hour. Will f/u with C1 unit based pharmacist for further monitoring in the event patient does not go to procedure.    Janice Coffin 01/29/2013,5:56 AM

## 2013-01-29 NOTE — Anesthesia Postprocedure Evaluation (Signed)
Anesthesia Post Note  Patient: Evan Perez  Procedure(s) Performed: Procedure(s) (LRB): ABDOMINAL AORTIC ENDOVASCULAR STENT GRAFT - GORE; ULTRASOUND GUIDED (N/A)  Anesthesia type: general  Patient location: PACU  Post pain: Pain level controlled  Post assessment: Patient's Cardiovascular Status Stable  Last Vitals:  Filed Vitals:   01/29/13 1226  BP: 145/67  Pulse: 70  Temp:   Resp: 13    Post vital signs: Reviewed and stable  Level of consciousness: sedated  Complications: No apparent anesthesia complications

## 2013-01-30 DIAGNOSIS — I10 Essential (primary) hypertension: Secondary | ICD-10-CM

## 2013-01-30 DIAGNOSIS — Z952 Presence of prosthetic heart valve: Secondary | ICD-10-CM

## 2013-01-30 LAB — CBC
HCT: 32.8 % — ABNORMAL LOW (ref 39.0–52.0)
Hemoglobin: 11.2 g/dL — ABNORMAL LOW (ref 13.0–17.0)
MCH: 30.4 pg (ref 26.0–34.0)
MCHC: 34.1 g/dL (ref 30.0–36.0)
MCV: 89.1 fL (ref 78.0–100.0)
Platelets: 114 10*3/uL — ABNORMAL LOW (ref 150–400)
RBC: 3.68 MIL/uL — ABNORMAL LOW (ref 4.22–5.81)
RDW: 13.5 % (ref 11.5–15.5)
WBC: 9.1 10*3/uL (ref 4.0–10.5)

## 2013-01-30 LAB — HEPARIN LEVEL (UNFRACTIONATED): Heparin Unfractionated: 0.27 IU/mL — ABNORMAL LOW (ref 0.30–0.70)

## 2013-01-30 LAB — BASIC METABOLIC PANEL
BUN: 32 mg/dL — ABNORMAL HIGH (ref 6–23)
CO2: 25 mEq/L (ref 19–32)
Calcium: 8.6 mg/dL (ref 8.4–10.5)
Chloride: 91 mEq/L — ABNORMAL LOW (ref 96–112)
Creatinine, Ser: 2.18 mg/dL — ABNORMAL HIGH (ref 0.50–1.35)
GFR calc Af Amer: 29 mL/min — ABNORMAL LOW (ref >90)
GFR calc non Af Amer: 25 mL/min — ABNORMAL LOW (ref >90)
Glucose, Bld: 174 mg/dL — ABNORMAL HIGH (ref 70–99)
Potassium: 4.7 mEq/L (ref 3.5–5.1)
Sodium: 126 mEq/L — ABNORMAL LOW (ref 135–145)

## 2013-01-30 MED ORDER — WARFARIN - PHARMACIST DOSING INPATIENT
Freq: Every day | Status: DC
  Administered 2013-02-03: 18:00:00

## 2013-01-30 MED ORDER — BISACODYL 10 MG RE SUPP
10.0000 mg | Freq: Every day | RECTAL | Status: DC | PRN
  Administered 2013-01-30: 10 mg via RECTAL
  Filled 2013-01-30: qty 1

## 2013-01-30 MED ORDER — DIPHENHYDRAMINE HCL 25 MG PO CAPS: 25.0000 mg | ORAL_CAPSULE | Freq: Four times a day (QID) | ORAL | Status: DC | PRN

## 2013-01-30 MED ORDER — FLEET ENEMA 7-19 GM/118ML RE ENEM
1.0000 | ENEMA | Freq: Every day | RECTAL | Status: DC | PRN
  Administered 2013-01-31: 1 via RECTAL
  Filled 2013-01-30: qty 1

## 2013-01-30 MED ORDER — WARFARIN SODIUM 7.5 MG PO TABS
7.5000 mg | ORAL_TABLET | Freq: Once | ORAL | Status: AC
  Administered 2013-01-30: 7.5 mg via ORAL
  Filled 2013-01-30: qty 1

## 2013-01-30 NOTE — Progress Notes (Addendum)
[facility] Progress Note  01/30/2013 7:41 AM 1 Day Post-Op  Subjective:  States his abdomen is bloated and needs to have a BM.  (no BM since Wed night)  Afebrile HR 50's-80's irregular 120's-150's systolic 92% RA  Filed Vitals:   01/30/13 0534  BP: 129/59  Pulse: 74  Temp: 98.4 F (36.9 C)  Resp: 17    Physical Exam: Incisions:  Bilateral groins are soft; dressing in tact Extremities:  Right AT doppler signal; left PT signal  CBC    Component Value Date/Time   WBC 9.1 01/30/2013 0215   RBC 3.68* 01/30/2013 0215   HGB 11.2* 01/30/2013 0215   HCT 32.8* 01/30/2013 0215   PLT 114* 01/30/2013 0215   MCV 89.1 01/30/2013 0215   MCH 30.4 01/30/2013 0215   MCHC 34.1 01/30/2013 0215   RDW 13.5 01/30/2013 0215   LYMPHSABS 1.0 01/15/2012 1632   MONOABS 0.9 01/15/2012 1632   EOSABS 0.6 01/15/2012 1632   BASOSABS 0.0 01/15/2012 1632    BMET    Component Value Date/Time   NA 126* 01/30/2013 0215   K 4.7 01/30/2013 0215   CL 91* 01/30/2013 0215   CO2 25 01/30/2013 0215   GLUCOSE 174* 01/30/2013 0215   BUN 32* 01/30/2013 0215   CREATININE 2.18* 01/30/2013 0215   CREATININE 1.60* 12/29/2012 0821   CALCIUM 8.6 01/30/2013 0215   GFRNONAA 25* 01/30/2013 0215   GFRAA 29* 01/30/2013 0215    INR    Component Value Date/Time   INR 1.34 01/29/2013 1053   INR 1.7 01/19/2013 1355     Intake/Output Summary (Last 24 hours) at 01/30/13 0741 Last data filed at 01/30/13 0600  Gross per 24 hour  Intake   2450 ml  Output   2100 ml  Net    350 ml     Assessment/Plan:  79 y.o. male is s/p:  #1: Endovascular repair of abdominal aortic and left iliac aneurysm  #2: Ultrasound-guided access, bilateral common femoral artery  #3: Catheter and aorta x2  #4: Abdominal aortogram  1 Day Post-Op  -pt doing well this am -pt's BUN/Cr are up this am-ck labs in am -? Discontinue ARB-will defer to cardiology -will restart coumadin per pharmacy -pt wants prune juice for BM -DVT  prophylaxis: heparin gtt-to transition to coumadin.  Had allergic reaction to lovenox.  Bridging with xarelto per cardiology.  Home when okay with cards. -transfer to 2W   Doreatha Massed, PA-C [facility] [phone] 01/30/2013 7:41 AM    I have examined the patient, reviewed and agree with above.  Gery Sabedra, MD 01/30/2013 10:51 AM

## 2013-01-30 NOTE — Progress Notes (Signed)
Utilization review completed.  

## 2013-01-30 NOTE — Progress Notes (Signed)
Subjective: Patient denies CP Objective: Filed Vitals:   01/29/13 2000 01/29/13 2303 01/29/13 2340 01/30/13 0534  BP: 126/65 125/65 153/60 129/59  Pulse: 63  75 74  Temp: 98.4 F (36.9 C)  98.1 F (36.7 C) 98.4 F (36.9 C)  TempSrc: Oral  Oral Oral  Resp: 17  13 17   Height:      Weight:      SpO2: 99%  100% 92%   Weight change:   Intake/Output Summary (Last 24 hours) at 01/30/13 0826 Last data filed at 01/30/13 0600  Gross per 24 hour  Intake   2450 ml  Output   2100 ml  Net    350 ml    General: Alert, awake, oriented x3, in no acute distress Neck:  JVP is normal Heart: Regular rate and rhythm, without murmurs, rubs, gallops.  Lungs: Clear to auscultation.  No rales or wheezes. Exemities:  No edema.   Neuro: Grossly intact, nonfocal.   Lab Results: Results for orders placed during the hospital encounter of 01/26/13 (from the past 24 hour(s))  CBC     Status: Abnormal   Collection Time    01/29/13 10:53 AM      Result Value Range   WBC 4.8  4.0 - 10.5 K/uL   RBC 3.83 (*) 4.22 - 5.81 MIL/uL   Hemoglobin 11.6 (*) 13.0 - 17.0 g/dL   HCT 04.5 (*) 40.9 - 81.1 %   MCV 88.5  78.0 - 100.0 fL   MCH 30.3  26.0 - 34.0 pg   MCHC 34.2  30.0 - 36.0 g/dL   RDW 91.4  78.2 - 95.6 %   Platelets 100 (*) 150 - 400 K/uL  BASIC METABOLIC PANEL     Status: Abnormal   Collection Time    01/29/13 10:53 AM      Result Value Range   Sodium 129 (*) 135 - 145 mEq/L   Potassium 3.9  3.5 - 5.1 mEq/L   Chloride 95 (*) 96 - 112 mEq/L   CO2 26  19 - 32 mEq/L   Glucose, Bld 101 (*) 70 - 99 mg/dL   BUN 29 (*) 6 - 23 mg/dL   Creatinine, Ser 2.13 (*) 0.50 - 1.35 mg/dL   Calcium 8.7  8.4 - 08.6 mg/dL   GFR calc non Af Amer 34 (*) >90 mL/min   GFR calc Af Amer 39 (*) >90 mL/min  PROTIME-INR     Status: Abnormal   Collection Time    01/29/13 10:53 AM      Result Value Range   Prothrombin Time 16.3 (*) 11.6 - 15.2 seconds   INR 1.34  0.00 - 1.49  APTT     Status: None   Collection Time     01/29/13 10:53 AM      Result Value Range   aPTT 33  24 - 37 seconds  MAGNESIUM     Status: None   Collection Time    01/29/13 10:53 AM      Result Value Range   Magnesium 2.0  1.5 - 2.5 mg/dL  HEPARIN LEVEL (UNFRACTIONATED)     Status: Abnormal   Collection Time    01/30/13  2:15 AM      Result Value Range   Heparin Unfractionated 0.27 (*) 0.30 - 0.70 IU/mL  CBC     Status: Abnormal   Collection Time    01/30/13  2:15 AM      Result Value Range   WBC 9.1  4.0 -  10.5 K/uL   RBC 3.68 (*) 4.22 - 5.81 MIL/uL   Hemoglobin 11.2 (*) 13.0 - 17.0 g/dL   HCT 16.1 (*) 09.6 - 04.5 %   MCV 89.1  78.0 - 100.0 fL   MCH 30.4  26.0 - 34.0 pg   MCHC 34.1  30.0 - 36.0 g/dL   RDW 40.9  81.1 - 91.4 %   Platelets 114 (*) 150 - 400 K/uL  BASIC METABOLIC PANEL     Status: Abnormal   Collection Time    01/30/13  2:15 AM      Result Value Range   Sodium 126 (*) 135 - 145 mEq/L   Potassium 4.7  3.5 - 5.1 mEq/L   Chloride 91 (*) 96 - 112 mEq/L   CO2 25  19 - 32 mEq/L   Glucose, Bld 174 (*) 70 - 99 mg/dL   BUN 32 (*) 6 - 23 mg/dL   Creatinine, Ser 7.82 (*) 0.50 - 1.35 mg/dL   Calcium 8.6  8.4 - 95.6 mg/dL   GFR calc non Af Amer 25 (*) >90 mL/min   GFR calc Af Amer 29 (*) >90 mL/min    Studies/Results: @RISRSLT24 @  Medications: Reviewed   @PROBHOSP @  1.  Atrial fibrillation.  Patient's rates are controlled  He remains on heparin with plans for bridging with coumadin initation  Check INR in AM  2.  S/p AVR (tissue prosthesis)  3. CAD  Mild by cath   No symptoms.    4.  HL  Not on statin  Did not tolerate lipitor or Zocor in past  This will need to be readdressed as outpatient  ? If crestor or livalo have been tried.      LOS: 4 days   Dietrich Pates 01/30/2013, 8:27 AM

## 2013-01-30 NOTE — Progress Notes (Signed)
ANTICOAGULATION CONSULT NOTE - Follow Up Consult  Pharmacy Consult for heparin Indication: AVR/Afib  Labs:  Recent Labs  01/28/13 0604 01/29/13 0415 01/29/13 1053 01/30/13 0215  HGB 12.2* 11.8* 11.6* 11.2*  HCT 36.0* 34.3* 33.9* 32.8*  PLT 125* 108* 100* 114*  APTT  --   --  33  --   LABPROT 19.4* 16.8* 16.3*  --   INR 1.69* 1.40 1.34  --   HEPARINUNFRC 0.63 0.90*  --  0.27*  CREATININE  --  1.74* 1.71* 2.18*    Assessment: 79yo male slightly subtherapeutic on heparin after resumed post-op.  Goal of Therapy:  Heparin level 0.3-0.7 units/ml   Plan:  Will increase heparin gtt slightly to 900 units/hr and check level in 8hr.  Vernard Gambles, PharmD, BCPS  01/30/2013,3:29 AM

## 2013-01-30 NOTE — Progress Notes (Signed)
ANTICOAGULATION CONSULT NOTE - Follow Up Consult  Pharmacy Consult for heparin - restarting Warfarin Indication: bridge for vascular surgery  Allergies  Allergen Reactions  . Dust Mite Extract   . Lipitor [Atorvastatin Calcium] Other (See Comments)    Nervousness     . Ramipril Other (See Comments)    Pt cannot remember reaction   . Zocor [Simvastatin] Other (See Comments)    Nervousness   . Lovenox [Enoxaparin Sodium] Rash    Rash on abdomen, back, lower legs.    Patient Measurements: Height: 5\' 10"  (177.8 cm) Weight: 169 lb 15.6 oz (77.1 kg) IBW/kg (Calculated) : 73 Heparin Dosing Weight:  77kg  Vital Signs: Temp: 98 F (36.7 C) (08/29 0800) Temp src: Oral (08/29 0800) BP: 131/66 mmHg (08/29 0800) Pulse Rate: 74 (08/29 0534)  Labs:  Recent Labs  01/28/13 0604 01/29/13 0415 01/29/13 1053 01/30/13 0215  HGB 12.2* 11.8* 11.6* 11.2*  HCT 36.0* 34.3* 33.9* 32.8*  PLT 125* 108* 100* 114*  APTT  --   --  33  --   LABPROT 19.4* 16.8* 16.3*  --   INR 1.69* 1.40 1.34  --   HEPARINUNFRC 0.63 0.90*  --  0.27*  CREATININE  --  1.74* 1.71* 2.18*   Estimated Creatinine Clearance: 24.2 ml/min (by C-G formula based on Cr of 2.18).  Assessment: Heparin level 0.27 this am and just below desired goal range.  Patient is s/p surgery for repair of abdominal aortic aneurysm.  He has a PMH of AFIB, tissue valve replacement for which he is on chronic Warfarin therapy.  He is to resume his Warfarin therapy today.  PTA he was on Rivaroxaban as a bridge for procedure.  This is typically not recommended for valve patients and interferes with the anticoagulation assays given you a false INR value.  Would not recommend this as a bride for potential discharge due to this interference.  He has worsening renal function with creatinine of 2.18 today and an estimated crcl of 24 ml/min.  His H/H has remained stable and his platelets are slowly trending higher.  HOME Dose:  Warfarin 5 mg daily except  7.5 mg on Monday's and Friday  Drug/Drug Interactions:  Medications reviewed, note the following: Effects: Levothyroxine may increase the hypoprothrombinemic effects of warfarin.  Mechanism: Unknown. An altered rate of catabolism of vitamin K-dependent clotting factors has been suggested.  Goal of Therapy:  Heparin level 0.3-0.7 units/ml   Plan:  1.  Continue IV heparin at 900 units/hr.   2.  Give Warfarin 7.5mg  today x 1 3.  Daily heparin levels and CBC as well as PT/INR 4.  Monitor for bleeding complications    Nadara Mustard, PharmD., MS Clinical Pharmacist Pager:  [phone] Thank you for allowing pharmacy to be part of this patients care team. 01/30/2013,8:36 AM

## 2013-01-30 NOTE — Progress Notes (Signed)
Pt amb 750 ft with standby assistance. Pt did not have any complaints. Will continue to monitor pt closely.

## 2013-01-30 NOTE — Progress Notes (Signed)
Patient is being transferred to 2W06 via wheelchair.Phone report has been called to [employer]. Patient and son aware of the transfer.

## 2013-01-31 LAB — CBC
HCT: 34.4 % — ABNORMAL LOW (ref 39.0–52.0)
Hemoglobin: 11.7 g/dL — ABNORMAL LOW (ref 13.0–17.0)
RBC: 3.86 MIL/uL — ABNORMAL LOW (ref 4.22–5.81)

## 2013-01-31 LAB — PROTIME-INR
INR: 1.26 (ref 0.00–1.49)
Prothrombin Time: 15.5 seconds — ABNORMAL HIGH (ref 11.6–15.2)

## 2013-01-31 LAB — HEPARIN LEVEL (UNFRACTIONATED): Heparin Unfractionated: 0.68 IU/mL (ref 0.30–0.70)

## 2013-01-31 MED ORDER — WARFARIN SODIUM 7.5 MG PO TABS
7.5000 mg | ORAL_TABLET | Freq: Once | ORAL | Status: AC
  Administered 2013-01-31: 7.5 mg via ORAL
  Filled 2013-01-31: qty 1

## 2013-01-31 MED ORDER — HEPARIN (PORCINE) IN NACL 100-0.45 UNIT/ML-% IJ SOLN
1300.0000 [IU]/h | INTRAMUSCULAR | Status: DC
  Administered 2013-02-02: 1150 [IU]/h via INTRAVENOUS
  Administered 2013-02-04: 1300 [IU]/h via INTRAVENOUS
  Filled 2013-01-31 (×5): qty 250

## 2013-01-31 NOTE — Progress Notes (Signed)
VASCULAR PROGRESS NOTE  Called to see pt bc/ of oozing from right groin  PHYSICAL EXAM: Filed Vitals:   01/30/13 1342 01/30/13 2130 01/31/13 0515 01/31/13 1349  BP: 89/46 160/68 146/60 110/62  Pulse: 55 44 100 94  Temp: 97.7 F (36.5 C) 98.4 F (36.9 C) 99 F (37.2 C) 99.3 F (37.4 C)  TempSrc: Oral Oral Oral Oral  Resp: 16 17 20 20   Height:      Weight:      SpO2: 92% 92% 90% 93%   Oozing from right groin. No swelling or hematoma. Looks like slow venous oozing.  Left groin with mild swelling.   LABS: Lab Results  Component Value Date   WBC 10.2 01/31/2013   HGB 11.7* 01/31/2013   HCT 34.4* 01/31/2013   MCV 89.1 01/31/2013   PLT 116* 01/31/2013   Lab Results  Component Value Date   CREATININE 2.18* 01/30/2013   Lab Results  Component Value Date   INR 1.26 01/31/2013   ASSESSMENT AND PLAN:  Pressure held on right groin. Will need to hold heparin for now. Restart in 6 hours slowly.  Will continue to monitor.  Cari Caraway Beeper: [phone] 01/31/2013

## 2013-01-31 NOTE — Progress Notes (Signed)
Primary cardiologist: Dr. Cassell Clement  Subjective:   Patient up in chair. Denies any chest or breathlessness. No palpitations.   Objective:   Temp:  [97.6 F (36.4 C)-99 F (37.2 C)] 99 F (37.2 C) (08/30 0515) Pulse Rate:  [44-100] 100 (08/30 0515) Resp:  [16-20] 20 (08/30 0515) BP: (89-160)/(46-68) 146/60 mmHg (08/30 0515) SpO2:  [90 %-92 %] 90 % (08/30 0515) Last BM Date: 01/30/13 (small amount )  Filed Weights   01/26/13 1635  Weight: 169 lb 15.6 oz (77.1 kg)    Intake/Output Summary (Last 24 hours) at 01/31/13 0825 Last data filed at 01/31/13 0604  Gross per 24 hour  Intake    240 ml  Output    351 ml  Net   -111 ml   Telemetry: Atrial fibrillation.  Exam:  General:  NAD.  Lungs: Clear, nonlabored.  Cardiac: Irregularly irregular, no gallop.  Abdomen: Nontender NABS.  Extremities: No pitting.  Lab Results:  Basic Metabolic Panel:  Recent Labs Lab 01/29/13 0415 01/29/13 1053 01/30/13 0215  NA 125* 129* 126*  K 4.0 3.9 4.7  CL 92* 95* 91*  CO2 23 26 25   GLUCOSE 80 101* 174*  BUN 33* 29* 32*  CREATININE 1.74* 1.71* 2.18*  CALCIUM 8.8 8.7 8.6  MG  --  2.0  --     Liver Function Tests:  Recent Labs Lab 01/26/13 1700  AST 24  ALT 18  ALKPHOS 74  BILITOT 0.5  PROT 8.2  ALBUMIN 4.2    CBC:  Recent Labs Lab 01/29/13 1053 01/30/13 0215 01/31/13 0610  WBC 4.8 9.1 10.2  HGB 11.6* 11.2* 11.7*  HCT 33.9* 32.8* 34.4*  MCV 88.5 89.1 89.1  PLT 100* 114* 116*    Coagulation:  Recent Labs Lab 01/29/13 0415 01/29/13 1053 01/31/13 0610  INR 1.40 1.34 1.26     Medications:   Scheduled Medications: . aspirin EC  81 mg Oral Q M,W,F  . azelastine  1 spray Each Nare BID  . cloNIDine  0.2 mg Oral BID  . colesevelam  1,250 mg Oral BID WC  . docusate sodium  100 mg Oral Daily  . fluticasone  1 spray Each Nare Daily  . hydrochlorothiazide  12.5 mg Oral Daily  . irbesartan  150 mg Oral Daily  . levothyroxine  25 mcg Oral  QAC breakfast  . loratadine  10 mg Oral Daily  . multivitamin with minerals  1 tablet Oral Daily  . pantoprazole  40 mg Oral Daily  . tiotropium  18 mcg Inhalation Daily  . Warfarin - Pharmacist Dosing Inpatient   Does not apply q1800     Infusions: . heparin 900 Units/hr (01/30/13 2222)     PRN Medications:  acetaminophen, acetaminophen, bisacodyl, diphenhydrAMINE, guaiFENesin, hydrALAZINE, labetalol, metoprolol, morphine injection, ondansetron, oxyCODONE, phenol, sodium phosphate   Assessment:   1. Postoperative day #2 status post EVAR., Dr. Edilia Bo.  2. Atrial fibrillation. Review of records indicates plan to use heparin bridge while Coumadin reinitiated after surgery. Reviewed the Coumadin clinic note from 8/18 from Ms. Putt, [facility]. INR today is 1.2. I reviewed this plan with the patient and also Dr. Edilia Bo.  3. History of bioprosthetic aortic valve replacement.  4. History of mild, nonobstructive CAD.  5. Acute on chronic renal insufficiency, creatinine up to 2.1 from 1.7.   Plan/Discussion:    Continue heparin bridge with reinitiation of Coumadin, followed by pharmacy. I discussed the plan with the patient and also Dr. Edilia Bo. Would hold off  on diuretic with increasing creatinine. Heart rate is also elevated in atrial fibrillation. He is not on any rate control medications at home. This will need to be followed, might need to initiate low-dose beta blocker.   Jonelle Sidle, M.D., F.A.C.C.

## 2013-01-31 NOTE — Progress Notes (Signed)
Found pt in chair with blood on right leg chair and floor  reinforced dressing on right leg paged doctor he gave no change in orders but came to the room and redressed the right groin

## 2013-01-31 NOTE — Progress Notes (Signed)
Vascular Surgery Continued oozing from right groin incision. Minimal oozing. Venous blood. Placed 3-0 nylon stitch under local anesthesia at bedside. No further bleeding.  Waverly Ferrari, MD, FACS Beeper [phone] 01/31/2013

## 2013-01-31 NOTE — Progress Notes (Addendum)
ANTICOAGULATION CONSULT NOTE - Follow Up Consult  Pharmacy Consult for heparin and Coumadin Indication: bridge for vascular surgery  Allergies  Allergen Reactions  . Dust Mite Extract   . Lipitor [Atorvastatin Calcium] Other (See Comments)    Nervousness     . Ramipril Other (See Comments)    Pt cannot remember reaction   . Zocor [Simvastatin] Other (See Comments)    Nervousness   . Lovenox [Enoxaparin Sodium] Rash    Rash on abdomen, back, lower legs.    Patient Measurements: Height: 5\' 10"  (177.8 cm) Weight: 169 lb 15.6 oz (77.1 kg) IBW/kg (Calculated) : 73 Heparin Dosing Weight:  77kg  Vital Signs: Temp: 99.3 F (37.4 C) (08/30 1349) Temp src: Oral (08/30 1349) BP: 110/62 mmHg (08/30 1349) Pulse Rate: 94 (08/30 1349)  Labs:  Recent Labs  01/29/13 0415 01/29/13 1053 01/30/13 0215 01/31/13 0610  HGB 11.8* 11.6* 11.2* 11.7*  HCT 34.3* 33.9* 32.8* 34.4*  PLT 108* 100* 114* 116*  APTT  --  33  --   --   LABPROT 16.8* 16.3*  --  15.5*  INR 1.40 1.34  --  1.26  HEPARINUNFRC 0.90*  --  0.27* 0.68  CREATININE 1.74* 1.71* 2.18*  --    Estimated Creatinine Clearance: 24.2 ml/min (by C-G formula based on Cr of 2.18).  Assessment: Heparin level 0.27 this am and just below desired goal range.  Patient is s/p surgery for repair of abdominal aortic aneurysm.  He has a PMH of AFIB, tissue valve replacement for which he is on chronic Warfarin therapy.  He is to resume his Warfarin therapy today.  PTA he was on Rivaroxaban as a bridge for procedure.  This is typically not recommended for valve patients and interferes with the anticoagulation assays given you a false INR value.  Would not recommend this as a bride for potential discharge due to this interference.  He has worsening renal function - last creatinine was 2.18 8/29>> an estimated crcl of 24 ml/min.  His H/H has remained stable and his platelets are low, stable.  Heparin level at upper end of goal this am with heparin at  850 units/hr.  Now, upon MD exam, noted to have oozing from R groin.  MD requested hold heparin x6h, resume at 2200 "slowly" per rx.  HOME Dose:  Warfarin 5 mg daily except 7.5 mg on Monday's and Friday  Drug/Drug Interactions:  Medications reviewed, note the following: Effects: Levothyroxine may increase the hypoprothrombinemic effects of warfarin.  Mechanism: Unknown. An altered rate of catabolism of vitamin K-dependent clotting factors has been suggested.  Goal of Therapy:  Heparin level 0.3-0.7 units/ml   Plan:  - Hold heparin x6h per MD orders - Resume at 2200 at a rate of 750 units/hr - Check daily HL, CBC, INR - Monitor groin site for resolution of oozing - Coumadin 7.5 mg po x1 tonight  Guiseppe Flanagan L. Illene Bolus, PharmD, BCPS Clinical Pharmacist Pager: [phone] Pharmacy: [phone] 01/31/2013 4:13 PM '

## 2013-01-31 NOTE — Progress Notes (Signed)
VASCULAR PROGRESS NOTE  SUBJECTIVE: Had a BM. Ambulating. Tolerating diet.   PHYSICAL EXAM: Filed Vitals:   01/30/13 1053 01/30/13 1342 01/30/13 2130 01/31/13 0515  BP: 131/68 89/46 160/68 146/60  Pulse: 81 55 44 100  Temp: 97.6 F (36.4 C) 97.7 F (36.5 C) 98.4 F (36.9 C) 99 F (37.2 C)  TempSrc: Oral Oral Oral Oral  Resp: 18 16 17 20   Height:      Weight:      SpO2:  92% 92% 90%   Both groins look fine. Lungs clear Feet warm  LABS: Lab Results  Component Value Date   WBC 10.2 01/31/2013   HGB 11.7* 01/31/2013   HCT 34.4* 01/31/2013   MCV 89.1 01/31/2013   PLT 116* 01/31/2013   Lab Results  Component Value Date   CREATININE 2.18* 01/30/2013   Lab Results  Component Value Date   INR 1.26 01/31/2013   CBG (last 3)   Recent Labs  01/28/13 1616  GLUCAP 116*   Active Problems:   Chronic anticoagulation  ASSESSMENT AND PLAN: 1. 2 Days Post-Op s/p: EVAR 2. Pt with h/o AVR and H/O A fib. Currently is on IV heparin and Coumadin has been restarted. Has allergy to Lovenox. Pharmacy notes that prior to admission he was on Rivaroxaban as a bridge for procedure. They note that this is typically not recommended for valve patients and interferes with the anticoagulation assays given you a false INR value. Would not recommend this as a bride for potential discharge due to this interference. 3. If cardiology has a different plan until Coumadin is therapeutic, then he could possibly go home today. If not, he will need to stay in the hospital on IV heparin until his Coumadin is therapeutic.   Cari Caraway Beeper: [phone] 01/31/2013

## 2013-02-01 DIAGNOSIS — N179 Acute kidney failure, unspecified: Secondary | ICD-10-CM

## 2013-02-01 DIAGNOSIS — M79609 Pain in unspecified limb: Secondary | ICD-10-CM

## 2013-02-01 DIAGNOSIS — M7989 Other specified soft tissue disorders: Secondary | ICD-10-CM

## 2013-02-01 DIAGNOSIS — Z09 Encounter for follow-up examination after completed treatment for conditions other than malignant neoplasm: Secondary | ICD-10-CM

## 2013-02-01 DIAGNOSIS — N189 Chronic kidney disease, unspecified: Secondary | ICD-10-CM

## 2013-02-01 LAB — BASIC METABOLIC PANEL
Calcium: 8.5 mg/dL (ref 8.4–10.5)
GFR calc Af Amer: 22 mL/min — ABNORMAL LOW (ref >90)
GFR calc non Af Amer: 19 mL/min — ABNORMAL LOW (ref >90)
Glucose, Bld: 74 mg/dL (ref 70–99)
Potassium: 4.4 mEq/L (ref 3.5–5.1)
Sodium: 125 mEq/L — ABNORMAL LOW (ref 135–145)

## 2013-02-01 LAB — PROTIME-INR
INR: 1.44 (ref 0.00–1.49)
Prothrombin Time: 17.2 seconds — ABNORMAL HIGH (ref 11.6–15.2)

## 2013-02-01 LAB — HEPARIN LEVEL (UNFRACTIONATED)
Heparin Unfractionated: <0.1 IU/mL — ABNORMAL LOW (ref 0.30–0.70)
Heparin Unfractionated: 0.15 IU/mL — ABNORMAL LOW (ref 0.30–0.70)

## 2013-02-01 LAB — CBC
Hemoglobin: 9 g/dL — ABNORMAL LOW (ref 13.0–17.0)
Platelets: 83 10*3/uL — ABNORMAL LOW (ref 150–400)
RBC: 2.97 MIL/uL — ABNORMAL LOW (ref 4.22–5.81)
WBC: 8.6 10*3/uL (ref 4.0–10.5)

## 2013-02-01 MED ORDER — WARFARIN SODIUM 7.5 MG PO TABS
7.5000 mg | ORAL_TABLET | Freq: Once | ORAL | Status: AC
  Administered 2013-02-01: 7.5 mg via ORAL
  Filled 2013-02-01: qty 1

## 2013-02-01 MED ORDER — SODIUM CHLORIDE 0.9 % IV SOLN
INTRAVENOUS | Status: DC
  Administered 2013-02-03: 50 mL/h via INTRAVENOUS
  Administered 2013-02-04: 15:00:00 via INTRAVENOUS

## 2013-02-01 NOTE — Progress Notes (Signed)
ANTICOAGULATION CONSULT NOTE - Follow Up Consult  Pharmacy Consult for heparin and Coumadin Indication: bridge for vascular surgery  Allergies  Allergen Reactions  . Dust Mite Extract   . Lipitor [Atorvastatin Calcium] Other (See Comments)    Nervousness     . Ramipril Other (See Comments)    Pt cannot remember reaction   . Zocor [Simvastatin] Other (See Comments)    Nervousness   . Lovenox [Enoxaparin Sodium] Rash    Rash on abdomen, back, lower legs.    Patient Measurements: Height: 5\' 10"  (177.8 cm) Weight: 173 lb 15.1 oz (78.9 kg) IBW/kg (Calculated) : 73 Heparin Dosing Weight:  77kg  Vital Signs: Temp: 99.5 F (37.5 C) (08/31 0442) Temp src: Oral (08/31 0442) BP: 117/51 mmHg (08/31 0442) Pulse Rate: 69 (08/31 0442)  Labs:  Recent Labs  01/29/13 1053 01/30/13 0215 01/31/13 0610 02/01/13 0440  HGB 11.6* 11.2* 11.7* 9.0*  HCT 33.9* 32.8* 34.4* 26.4*  PLT 100* 114* 116* 83*  APTT 33  --   --   --   LABPROT 16.3*  --  15.5* 17.2*  INR 1.34  --  1.26 1.44  HEPARINUNFRC  --  0.27* 0.68 <0.10*  CREATININE 1.71* 2.18*  --  2.73*   Estimated Creatinine Clearance: 19.3 ml/min (by C-G formula based on Cr of 2.73).  Assessment: Heparin level 0.27 this am and just below desired goal range.  Patient is s/p surgery for repair of abdominal aortic aneurysm.  He has a PMH of AFIB, tissue valve replacement for which he is on chronic Warfarin therapy.  He is to resume his Warfarin therapy today.  PTA he was on Rivaroxaban as a bridge for procedure.  This is typically not recommended for valve patients and interferes with the anticoagulation assays given you a false INR value.  Would not recommend this as a bride for potential discharge due to this interference.  He has worsening renal function - last creatinine was 2.18 8/29>> an estimated crcl of 24 ml/min.  His H/H has remained stable and his platelets are low, stable.  HL undetectable this am -- adjusted by pharmacist  overnight.  No further oozing from groin site noted.  INR trending appropriately towards goal.  Hgb dropped overnigth 11.7>9.  Will continue to monitor.  SCr trending upwards 2.18>>2.73.  HOME Dose:  Warfarin 5 mg daily except 7.5 mg on Monday's and Friday  Drug/Drug Interactions:  Medications reviewed, note the following: Effects: Levothyroxine may increase the hypoprothrombinemic effects of warfarin.  Mechanism: Unknown. An altered rate of catabolism of vitamin K-dependent clotting factors has been suggested.  Goal of Therapy:  Heparin level 0.3-0.7 units/ml   Plan:  - Continue heparin at 850 units/hr (adjusted overnight) - f/u 1400 HL - Coumadin 7.5 mg po x1 tonight - Check daily HL, CBC, INR  Mihika Surrette L. Illene Bolus, PharmD, BCPS Clinical Pharmacist Pager: [phone] Pharmacy: [phone] 02/01/2013 10:48 AM '

## 2013-02-01 NOTE — Progress Notes (Signed)
ANTICOAGULATION CONSULT NOTE - Follow Up Consult  Pharmacy Consult for heparin Indication: AVR/Afib  Labs:  Recent Labs  01/29/13 1053 01/30/13 0215 01/31/13 0610 02/01/13 0440  HGB 11.6* 11.2* 11.7* 9.0*  HCT 33.9* 32.8* 34.4* 26.4*  PLT 100* 114* 116* 83*  APTT 33  --   --   --   LABPROT 16.3*  --  15.5* 17.2*  INR 1.34  --  1.26 1.44  HEPARINUNFRC  --  0.27* 0.68 <0.10*  CREATININE 1.71* 2.18*  --  2.73*    Assessment: 79yo male undetectable on heparin after holding then resuming at low rate for oozing at groin site, no bleeding since suture last pm, noted lower H/H.  Goal of Therapy:  Heparin level 0.3-0.7 units/ml   Plan:  Will increase heparin gtt slightly to 850 units/hr and check level in 8hr.  Vernard Gambles, PharmD, BCPS  02/01/2013,5:50 AM

## 2013-02-01 NOTE — Progress Notes (Signed)
ANTICOAGULATION CONSULT NOTE - Follow Up Consult  Pharmacy Consult for heparin Indication: AVR/Afib  Labs:  Recent Labs  01/30/13 0215 01/31/13 0610 02/01/13 0440 02/01/13 1345 02/01/13 2230  HGB 11.2* 11.7* 9.0*  --   --   HCT 32.8* 34.4* 26.4*  --   --   PLT 114* 116* 83*  --   --   LABPROT  --  15.5* 17.2*  --   --   INR  --  1.26 1.44  --   --   HEPARINUNFRC 0.27* 0.68 <0.10* 0.12* 0.15*  CREATININE 2.18*  --  2.73*  --   --     Assessment: 79yo male remains subtherapeutic on heparin after rate increases, no bleeding since suture last pm.  Goal of Therapy:  Heparin level 0.3-0.7 units/ml   Plan:  Will increase heparin gtt by 2 units/kg/hr to 1100 units/hr and check level in 8hr.  Vernard Gambles, PharmD, BCPS  02/01/2013,11:12 PM

## 2013-02-01 NOTE — Progress Notes (Signed)
ANTICOAGULATION CONSULT NOTE - Follow Up Consult  Pharmacy Consult for heparin and Coumadin Indication: bridge for vascular surgery  Allergies  Allergen Reactions  . Dust Mite Extract   . Lipitor [Atorvastatin Calcium] Other (See Comments)    Nervousness     . Ramipril Other (See Comments)    Pt cannot remember reaction   . Zocor [Simvastatin] Other (See Comments)    Nervousness   . Lovenox [Enoxaparin Sodium] Rash    Rash on abdomen, back, lower legs.    Patient Measurements: Height: 5\' 10"  (177.8 cm) Weight: 173 lb 15.1 oz (78.9 kg) IBW/kg (Calculated) : 73 Heparin Dosing Weight:  77kg  Vital Signs: Temp: 98.2 F (36.8 C) (08/31 1300) Temp src: Oral (08/31 1300) BP: 96/59 mmHg (08/31 1346) Pulse Rate: 88 (08/31 1346)  Labs:  Recent Labs  01/30/13 0215 01/31/13 0610 02/01/13 0440 02/01/13 1345  HGB 11.2* 11.7* 9.0*  --   HCT 32.8* 34.4* 26.4*  --   PLT 114* 116* 83*  --   LABPROT  --  15.5* 17.2*  --   INR  --  1.26 1.44  --   HEPARINUNFRC 0.27* 0.68 <0.10* 0.12*  CREATININE 2.18*  --  2.73*  --    Estimated Creatinine Clearance: 19.3 ml/min (by C-G formula based on Cr of 2.73).  Assessment: 79 yo M s/p surgery for repair of abdominal aortic aneurysm.  He has a PMH of AFIB, tissue valve replacement for which he is on chronic Warfarin therapy.  PTA he was on Rivaroxaban as a bridge for procedure.  This is typically not recommended for valve patients and interferes with the anticoagulation assays given you a false INR value.  Would not recommend this as a bride for potential discharge due to this interference.    PM: heparin level now reported as 0.12 after increase in drip rate this am.  Also, noted that doppler reveals a minor hematoma in proximal thigh at groin.  Continue to monitor.  HOME Dose:  Warfarin 5 mg daily except 7.5 mg on Monday's and Friday  Drug/Drug Interactions:  Medications reviewed, note the following: Effects: Levothyroxine may increase  the hypoprothrombinemic effects of warfarin.  Mechanism: Unknown. An altered rate of catabolism of vitamin K-dependent clotting factors has been suggested.  Goal of Therapy:  Heparin level 0.3-0.7 units/ml   Plan:  - Increase heparin to 950 units/hr - f/u 8h HL (due at 2300) - Coumadin 7.5 mg po x1 tonight (as ordered) - Check daily HL, CBC, INR  Rhythm Gubbels L. Illene Bolus, PharmD, BCPS Clinical Pharmacist Pager: [phone] Pharmacy: [phone] 02/01/2013 2:31 PM '

## 2013-02-01 NOTE — Progress Notes (Addendum)
[facility]  Post-op EVAR Date of Surgery: 01/26/2013 - 01/29/2013 Surgeon: Moishe Spice): Evan Libman, MD POD: 3 Days Post-Op Evan Perez is a 79 y.o. male who is s/p EVAR. The patient denies back pain; denies abdominal pain; denies lower extremity pain.  He is Ambulating and taking PO well without nausea or vomiting. Pt. has voided with foley out  Significant Diagnostic Studies: CBC Lab Results  Component Value Date   WBC 8.6 02/01/2013   HGB 9.0* 02/01/2013   HCT 26.4* 02/01/2013   MCV 88.9 02/01/2013   PLT 83* 02/01/2013  BMET    Component Value Date/Time   NA 125* 02/01/2013 0440   K 4.4 02/01/2013 0440   CL 92* 02/01/2013 0440   CO2 23 02/01/2013 0440   GLUCOSE 74 02/01/2013 0440   BUN 43* 02/01/2013 0440   CREATININE 2.73* 02/01/2013 0440   CREATININE 1.60* 12/29/2012 0821   CALCIUM 8.5 02/01/2013 0440   GFRNONAA 19* 02/01/2013 0440   GFRAA 22* 02/01/2013 0440    COAG Lab Results  Component Value Date   INR 1.44 02/01/2013   INR 1.26 01/31/2013   INR 1.34 01/29/2013   I/O last 3 completed shifts: In: -  Out: 751 [Urine:750; Stool:1]  Physical Examination  BP Readings from Last 3 Encounters:  02/01/13 117/51  02/01/13 117/51  01/26/13 146/63   Temp Readings from Last 3 Encounters:  02/01/13 99.5 F (37.5 C) Oral  02/01/13 99.5 F (37.5 C) Oral  01/06/13 97.6 F (36.4 C) Oral   SpO2 Readings from Last 3 Encounters:  02/01/13 92%  02/01/13 92%  01/26/13 98%   Pulse Readings from Last 3 Encounters:  02/01/13 69  02/01/13 69  01/26/13 84   General: A&O x 3, WDWN male in NAD Gait: Normal Pulmonary: normal non-labored breathing  Cardiac: RRR Abdomen: soft, NT, NABS Bilateral groin wounds: clean, dry, intact, with hematoma resolved bleeding on the right dressing clean and dry, left min hematoma and mod. ecchymosis Extremities without ischemic changes, no Gangrene , no cellulitis; no open wounds;   Neurologic: A&O X 3;  Appropriate Affect  Assessment: Evan Perez is a 79 y.o. male who is 3 Days Post-Op EVAR.  Pt is doing well with no complaints  Plan: Home once his INR is theraputic  The patient will follow up with [place] in one month with these studies.  INR is 1.44 today I will leave the groin dressing in place until tomorrow for continued pressure.  SignedClinton Gallant [facility]  02/01/2013 7:45 AM.  Agree with above. No further bleeding from the right groin. Moderate swelling in the left groin is stable.  Will check Duplex of left groin to rule out a pseudoaneurysm.   Waverly Ferrari, MD, FACS Beeper [phone] 02/01/2013

## 2013-02-01 NOTE — Progress Notes (Signed)
VASCULAR LAB PRELIMINARY  PRELIMINARY  PRELIMINARY  PRELIMINARY  Left groin ultrasound  completed.    Preliminary report:  No evidence of pseudoaneurysm or AV fistula. Doppler waveforms are triphasic throughout out the common femoral, femoral and proximal profunda arteries. There is evidence of a hematoma in the most proximal thigh at the groin.  Arshdeep Bolger, RVS 02/01/2013, 11:09 AM

## 2013-02-01 NOTE — Progress Notes (Signed)
Primary cardiologist: Dr. Cassell Clement  Subjective:   No chest pain or breathlessness. Has had constipation. Reports no trouble with urine output.   Objective:   Temp:  [99.3 F (37.4 C)-100.4 F (38 C)] 99.5 F (37.5 C) (08/31 0442) Pulse Rate:  [69-95] 69 (08/31 0442) Resp:  [18-20] 18 (08/31 0442) BP: (110-123)/(51-65) 117/51 mmHg (08/31 0442) SpO2:  [92 %-94 %] 92 % (08/31 0442) Weight:  [173 lb 15.1 oz (78.9 kg)] 173 lb 15.1 oz (78.9 kg) (08/31 0442) Last BM Date: 01/30/13 (small amount )  Filed Weights   01/26/13 1635 02/01/13 0442  Weight: 169 lb 15.6 oz (77.1 kg) 173 lb 15.1 oz (78.9 kg)    Intake/Output Summary (Last 24 hours) at 02/01/13 0802 Last data filed at 02/01/13 0451  Gross per 24 hour  Intake      0 ml  Output    400 ml  Net   -400 ml   Telemetry: Atrial fibrillation. Increased rates when standing and moving around.  Exam:  General:  NAD.  Lungs: Clear, nonlabored.  Cardiac: Irregularly irregular, no gallop.  Abdomen: Nontender NABS.  Extremities: No pitting.  Lab Results:  Basic Metabolic Panel:  Recent Labs Lab 01/29/13 1053 01/30/13 0215 02/01/13 0440  NA 129* 126* 125*  K 3.9 4.7 4.4  CL 95* 91* 92*  CO2 26 25 23   GLUCOSE 101* 174* 74  BUN 29* 32* 43*  CREATININE 1.71* 2.18* 2.73*  CALCIUM 8.7 8.6 8.5  MG 2.0  --   --     Liver Function Tests:  Recent Labs Lab 01/26/13 1700  AST 24  ALT 18  ALKPHOS 74  BILITOT 0.5  PROT 8.2  ALBUMIN 4.2    CBC:  Recent Labs Lab 01/30/13 0215 01/31/13 0610 02/01/13 0440  WBC 9.1 10.2 8.6  HGB 11.2* 11.7* 9.0*  HCT 32.8* 34.4* 26.4*  MCV 89.1 89.1 88.9  PLT 114* 116* 83*    Coagulation:  Recent Labs Lab 01/29/13 1053 01/31/13 0610 02/01/13 0440  INR 1.34 1.26 1.44     Medications:   Scheduled Medications: . aspirin EC  81 mg Oral Q M,W,F  . azelastine  1 spray Each Nare BID  . cloNIDine  0.2 mg Oral BID  . colesevelam  1,250 mg Oral BID WC  .  docusate sodium  100 mg Oral Daily  . fluticasone  1 spray Each Nare Daily  . irbesartan  150 mg Oral Daily  . levothyroxine  25 mcg Oral QAC breakfast  . loratadine  10 mg Oral Daily  . multivitamin with minerals  1 tablet Oral Daily  . pantoprazole  40 mg Oral Daily  . tiotropium  18 mcg Inhalation Daily  . Warfarin - Pharmacist Dosing Inpatient   Does not apply q1800    Infusions: . heparin 850 Units/hr (02/01/13 0553)    PRN Medications: acetaminophen, acetaminophen, bisacodyl, diphenhydrAMINE, guaiFENesin, hydrALAZINE, labetalol, metoprolol, morphine injection, ondansetron, oxyCODONE, phenol, sodium phosphate   Assessment:   1. Postoperative day #3 status post EVAR.  2. Atrial fibrillation. Review of records indicates plan to use heparin bridge while Coumadin reinitiated after surgery. Reviewed the Coumadin clinic note from 8/18 from Ms. Putt, [facility]. INR today is 1.4.  3. History of bioprosthetic aortic valve replacement.  4. History of mild, nonobstructive CAD.  5. Acute on chronic renal insufficiency, creatinine up to 2.7. Diuretic held. Recent UA negative.   Plan/Discussion:    Continue heparin bridge with reinitiation of Coumadin, followed by  pharmacy. Will hold ARB temporarily, increase hydration. Followup BMET.  Jonelle Sidle, M.D., F.A.C.C.

## 2013-02-02 LAB — BASIC METABOLIC PANEL
BUN: 44 mg/dL — ABNORMAL HIGH (ref 6–23)
CO2: 20 mEq/L (ref 19–32)
Chloride: 93 mEq/L — ABNORMAL LOW (ref 96–112)
Creatinine, Ser: 2.46 mg/dL — ABNORMAL HIGH (ref 0.50–1.35)
GFR calc Af Amer: 25 mL/min — ABNORMAL LOW (ref >90)
Glucose, Bld: 78 mg/dL (ref 70–99)
Potassium: 4.5 mEq/L (ref 3.5–5.1)

## 2013-02-02 LAB — PROTIME-INR: INR: 1.4 (ref 0.00–1.49)

## 2013-02-02 LAB — CBC
HCT: 27.2 % — ABNORMAL LOW (ref 39.0–52.0)
Hemoglobin: 9.5 g/dL — ABNORMAL LOW (ref 13.0–17.0)
MCH: 30.9 pg (ref 26.0–34.0)
MCHC: 34.9 g/dL (ref 30.0–36.0)
MCV: 88.6 fL (ref 78.0–100.0)
RDW: 14.2 % (ref 11.5–15.5)

## 2013-02-02 MED ORDER — WARFARIN SODIUM 10 MG PO TABS
10.0000 mg | ORAL_TABLET | Freq: Once | ORAL | Status: AC
  Administered 2013-02-02: 10 mg via ORAL
  Filled 2013-02-02: qty 1

## 2013-02-02 NOTE — Progress Notes (Signed)
VASCULAR PROGRESS NOTE  SUBJECTIVE: No complaints except frustrated that he is still in the hospital.   PHYSICAL EXAM: Filed Vitals:   02/01/13 1300 02/01/13 1346 02/01/13 2029 02/02/13 0622  BP: 71/49 96/59 159/72 136/74  Pulse: 80 88 91 89  Temp: 98.2 F (36.8 C)  99.8 F (37.7 C) 97.4 F (36.3 C)  TempSrc: Oral  Oral Oral  Resp: 18  18 18   Height:      Weight:    174 lb 3.2 oz (79.017 kg)  SpO2: 99%  92% 93%   Swelling left groin stable No bleeding right groin.   LABS: Lab Results  Component Value Date   WBC 7.8 02/02/2013   HGB 9.5* 02/02/2013   HCT 27.2* 02/02/2013   MCV 88.6 02/02/2013   PLT 110* 02/02/2013   Lab Results  Component Value Date   CREATININE 2.46* 02/02/2013   Lab Results  Component Value Date   INR 1.40 02/02/2013   Active Problems:   Atrial fibrillation   Status post aortic valve replacement with bioprosthetic valve   Chronic anticoagulation   Acute on chronic renal failure  Duplex of left groin: hematoma, no pseudoaneuysm.  ASSESSMENT AND PLAN:  * 4 Days Post-Op s/p: EVAR  * IV heparin until Coumadin therapeutic then D/C. (Coumadin and heparin being managed by Pharmacy)  Cari Caraway Beeper: [phone] 02/02/2013

## 2013-02-02 NOTE — Progress Notes (Signed)
Patient ID: Evan Perez, male   DOB: 1922/10/20, 79 y.o.   MRN: [id number]   SUBJECTIVE:  Patient continues to do well after his endovascular procedure. He is being re\re coumadinized.   Filed Vitals:   02/01/13 1300 02/01/13 1346 02/01/13 2029 02/02/13 0622  BP: 71/49 96/59 159/72 136/74  Pulse: 80 88 91 89  Temp: 98.2 F (36.8 C)  99.8 F (37.7 C) 97.4 F (36.3 C)  TempSrc: Oral  Oral Oral  Resp: 18  18 18   Height:      Weight:    174 lb 3.2 oz (79.017 kg)  SpO2: 99%  92% 93%    Intake/Output Summary (Last 24 hours) at 02/02/13 0842 Last data filed at 02/02/13 0500  Gross per 24 hour  Intake      0 ml  Output    800 ml  Net   -800 ml    LABS: Basic Metabolic Panel:  Recent Labs  29/52/84 0440 02/02/13 0710  NA 125* 125*  K 4.4 4.5  CL 92* 93*  CO2 23 20  GLUCOSE 74 78  BUN 43* 44*  CREATININE 2.73* 2.46*  CALCIUM 8.5 8.7   Liver Function Tests: No results found for this basename: AST, ALT, ALKPHOS, BILITOT, PROT, ALBUMIN,  in the last 72 hours No results found for this basename: LIPASE, AMYLASE,  in the last 72 hours CBC:  Recent Labs  02/01/13 0440 02/02/13 0710  WBC 8.6 7.8  HGB 9.0* 9.5*  HCT 26.4* 27.2*  MCV 88.9 88.6  PLT 83* 110*   Cardiac Enzymes: No results found for this basename: CKTOTAL, CKMB, CKMBINDEX, TROPONINI,  in the last 72 hours BNP: No components found with this basename: POCBNP,  D-Dimer: No results found for this basename: DDIMER,  in the last 72 hours Hemoglobin A1C: No results found for this basename: HGBA1C,  in the last 72 hours Fasting Lipid Panel: No results found for this basename: CHOL, HDL, LDLCALC, TRIG, CHOLHDL, LDLDIRECT,  in the last 72 hours Thyroid Function Tests: No results found for this basename: TSH, T4TOTAL, FREET3, T3FREE, THYROIDAB,  in the last 72 hours  RADIOLOGY: Dg Chest Portable 1 View  01/29/2013   *RADIOLOGY REPORT*  Clinical Data: Stent graft placement.  PORTABLE CHEST - 1 VIEW   Comparison: Chest x-ray 01/26/2013.  Findings: There is cephalization of the pulmonary vasculature and slight indistinctness of the interstitial markings suggestive of mild pulmonary edema.  Moderate cardiomegaly with prominence of the right atrial contour.  Obscuration of the left cardiac border suggesting atelectasis and/or consolidation in the lingula. Possible trace right pleural effusion.  Upper mediastinal contours are within normal limits.  Atherosclerosis in the thoracic aorta. Status post median sternotomy for aortic valve replacement (a stented bioprosthesis is noted).  IMPRESSION: 1.  Interval development of mild congestive heart failure. 2.  Opacity partially obscuring the left heart border concerning for atelectasis and/or consolidation in the lingula. 3.  Moderate cardiomegaly is unchanged. 4.  Atherosclerosis.   Original Report Authenticated By: Trudie Reed, M.D.   Dg Chest Portable 1 View  01/26/2013   *RADIOLOGY REPORT*  Clinical Data: Preop for abdominal aortic aneurysm.  Hypertension. Ex-smoker.  PORTABLE CHEST - 1 VIEW  Comparison: 05/13/2012  Findings: Mild hyperinflation. Prior median sternotomy. Midline trachea.  Mild cardiomegaly with atherosclerosis in the transverse aorta. No pleural effusion or pneumothorax.  Apparent nodular density projecting over the right upper lobe is most likely related to the overlying EKG lead.  There is also left  base scarring. Nodular density projects over the right lung base laterally.  IMPRESSION: Cardiomegaly, without acute disease.  Probable artifactual density projecting over the right upper lobe.  Consider repeat frontal radiograph with removal of EKG leads.  Possible nipple shadow projecting at the right lung base.  Consider nipple markers on repeat frontal.   Original Report Authenticated By: Jeronimo Greaves, M.D.   Dg Abd Portable 1v  01/29/2013   *RADIOLOGY REPORT*  Clinical Data: Status post stent graft placement  PORTABLE ABDOMEN - 1 VIEW   Comparison: 12/29/2012  Findings: The bifurcated aortic stent graft is now noted in place. Multiple coils are noted within the left internal iliac artery.  No acute abnormality is seen.   Original Report Authenticated By: Alcide Clever, M.D.    PHYSICAL EXAM patient is oriented to person time and place. Affect is normal. Lungs reveal scattered rhonchi. Cardiac exam reveals S1 and S2. The abdomen is soft. There is no peripheral edema.   TELEMETRY: I have reviewed telemetry today February 02, 2013. Atrial fibrillation continues. Most of the time the rate is controlled. He does have some increased rate when he is up ambulating.   ASSESSMENT AND PLAN:  He is recovering well from his endovascular procedure.    Atrial fibrillation     Atrial fibrillation is chronic and is being followed.    Status post aortic valve replacement with bioprosthetic valve    Chronic anticoagulation      His Coumadin has been restarted.    Acute on chronic renal failure     Creatinine is a little better today. Hopefully he will return back to the range of 1.7. No further medication adjustments are recommended for this this morning.  Willa Rough 02/02/2013 8:42 AM

## 2013-02-02 NOTE — Progress Notes (Addendum)
ANTICOAGULATION CONSULT NOTE - Follow Up Consult  Pharmacy Consult for heparin/warfarin Indication: AVR/AFib  Allergies  Allergen Reactions  . Dust Mite Extract   . Lipitor [Atorvastatin Calcium] Other (See Comments)    Nervousness     . Ramipril Other (See Comments)    Pt cannot remember reaction   . Zocor [Simvastatin] Other (See Comments)    Nervousness   . Lovenox [Enoxaparin Sodium] Rash    Rash on abdomen, back, lower legs.     Patient Measurements: Height: 5\' 10"  (177.8 cm) Weight: 174 lb 3.2 oz (79.017 kg) IBW/kg (Calculated) : 73 Heparin Dosing Weight: 77kg  Vital Signs: Temp: 97.4 F (36.3 C) (09/01 0622) Temp src: Oral (09/01 0622) BP: 136/74 mmHg (09/01 0622) Pulse Rate: 89 (09/01 0622)  Labs:  Recent Labs  01/31/13 0610 02/01/13 0440 02/01/13 1345 02/01/13 2230 02/02/13 0710  HGB 11.7* 9.0*  --   --  9.5*  HCT 34.4* 26.4*  --   --  27.2*  PLT 116* 83*  --   --  110*  LABPROT 15.5* 17.2*  --   --  16.8*  INR 1.26 1.44  --   --  1.40  HEPARINUNFRC 0.68 <0.10* 0.12* 0.15* 0.29*  CREATININE  --  2.73*  --   --  2.46*    Estimated Creatinine Clearance: 21.4 ml/min (by C-G formula based on Cr of 2.46).   Medications:  Scheduled:  . aspirin EC  81 mg Oral Q M,W,F  . azelastine  1 spray Each Nare BID  . cloNIDine  0.2 mg Oral BID  . colesevelam  1,250 mg Oral BID WC  . docusate sodium  100 mg Oral Daily  . fluticasone  1 spray Each Nare Daily  . levothyroxine  25 mcg Oral QAC breakfast  . loratadine  10 mg Oral Daily  . multivitamin with minerals  1 tablet Oral Daily  . pantoprazole  40 mg Oral Daily  . tiotropium  18 mcg Inhalation Daily  . Warfarin - Pharmacist Dosing Inpatient   Does not apply q1800    Assessment: 53 YOM being transitioned from heparin to warfarin s/p repair of AAA with a PMH of AFib. Home dose of warfarin is 5mg  daily except 7.5mg  Mondays and Wednesdays. INR today is 1.4. His heparin level remains slightly below goal this  morning. A doppler did show hematoma of the left groin. Discussed with RN Aundra Millet and there is no overt bleeding. His Hgb has dropped to 9.5, though platelets appear to continue to trend up.  Goal of Therapy:  INR 2-3 Heparin level 0.3-0.7 units/ml Monitor platelets by anticoagulation protocol: Yes   Plan:  1. Increase heparin rate slightly to 1150units/hr 2. Heparin level in 8 hours 3. Warfarin 10mg  po x1 tonight 4. Daily CBC, HL and PT/INR 5. Follow up for any instances of bleeding/bruising  Lauren D. Bajbus, PharmD Clinical Pharmacist Pager: [phone] 02/02/2013 8:40 AM  Addendum:  Rechecked anti-Xa level at 1600, level is 0.32, low-end therapeutic, obtained 6 hrs after rate increase.   Plan: Will continue heparin infusion at 1150 units/hr and f/u AM labs

## 2013-02-03 ENCOUNTER — Encounter (HOSPITAL_COMMUNITY): Payer: Self-pay | Admitting: Surgery

## 2013-02-03 LAB — CBC
HCT: 27.6 % — ABNORMAL LOW (ref 39.0–52.0)
Hemoglobin: 9.5 g/dL — ABNORMAL LOW (ref 13.0–17.0)
MCH: 30.5 pg (ref 26.0–34.0)
MCV: 88.7 fL (ref 78.0–100.0)
Platelets: 113 10*3/uL — ABNORMAL LOW (ref 150–400)
RBC: 3.11 MIL/uL — ABNORMAL LOW (ref 4.22–5.81)
WBC: 7 10*3/uL (ref 4.0–10.5)

## 2013-02-03 LAB — HEPARIN LEVEL (UNFRACTIONATED): Heparin Unfractionated: 0.35 IU/mL (ref 0.30–0.70)

## 2013-02-03 MED ORDER — WARFARIN SODIUM 5 MG PO TABS
5.0000 mg | ORAL_TABLET | Freq: Once | ORAL | Status: AC
  Administered 2013-02-03: 5 mg via ORAL
  Filled 2013-02-03: qty 1

## 2013-02-03 MED ORDER — FUROSEMIDE 10 MG/ML IJ SOLN
40.0000 mg | Freq: Once | INTRAMUSCULAR | Status: AC
  Administered 2013-02-03: 40 mg via INTRAVENOUS

## 2013-02-03 NOTE — Progress Notes (Signed)
Patient ID: Evan Perez, male   DOB: [birth year], 79 y.o.   MRN: [id number]   SUBJECTIVE:  Patient denies chest pain; mild dyspnea.  Filed Vitals:   02/02/13 0622 02/02/13 1322 02/02/13 1938 02/03/13 0520  BP: 136/74 122/68 133/70 124/58  Pulse: 89 57 82 90  Temp: 97.4 F (36.3 C) 97.5 F (36.4 C) 98.9 F (37.2 C) 99.1 F (37.3 C)  TempSrc: Oral Oral Oral Oral  Resp: 18 18 18 16   Height:      Weight: 174 lb 3.2 oz (79.017 kg)   176 lb 8 oz (80.06 kg)  SpO2: 93% 100% 100% 98%    Intake/Output Summary (Last 24 hours) at 02/03/13 0758 Last data filed at 02/03/13 0525  Gross per 24 hour  Intake    240 ml  Output    900 ml  Net   -660 ml    LABS: Basic Metabolic Panel:  Recent Labs  40/98/11 0440 02/02/13 0710  NA 125* 125*  K 4.4 4.5  CL 92* 93*  CO2 23 20  GLUCOSE 74 78  BUN 43* 44*  CREATININE 2.73* 2.46*  CALCIUM 8.5 8.7   CBC:  Recent Labs  02/02/13 0710 02/03/13 0555  WBC 7.8 7.0  HGB 9.5* 9.5*  HCT 27.2* 27.6*  MCV 88.6 88.7  PLT 110* 113*    RADIOLOGY: Dg Chest Portable 1 View  01/29/2013   *RADIOLOGY REPORT*  Clinical Data: Stent graft placement.  PORTABLE CHEST - 1 VIEW  Comparison: Chest x-ray 01/26/2013.  Findings: There is cephalization of the pulmonary vasculature and slight indistinctness of the interstitial markings suggestive of mild pulmonary edema.  Moderate cardiomegaly with prominence of the right atrial contour.  Obscuration of the left cardiac border suggesting atelectasis and/or consolidation in the lingula. Possible trace right pleural effusion.  Upper mediastinal contours are within normal limits.  Atherosclerosis in the thoracic aorta. Status post median sternotomy for aortic valve replacement (a stented bioprosthesis is noted).  IMPRESSION: 1.  Interval development of mild congestive heart failure. 2.  Opacity partially obscuring the left heart border concerning for atelectasis and/or consolidation in the lingula. 3.  Moderate  cardiomegaly is unchanged. 4.  Atherosclerosis.   Original Report Authenticated By: Trudie Reed, M.D.   Dg Chest Portable 1 View  01/26/2013   *RADIOLOGY REPORT*  Clinical Data: Preop for abdominal aortic aneurysm.  Hypertension. Ex-smoker.  PORTABLE CHEST - 1 VIEW  Comparison: 05/13/2012  Findings: Mild hyperinflation. Prior median sternotomy. Midline trachea.  Mild cardiomegaly with atherosclerosis in the transverse aorta. No pleural effusion or pneumothorax.  Apparent nodular density projecting over the right upper lobe is most likely related to the overlying EKG lead.  There is also left base scarring. Nodular density projects over the right lung base laterally.  IMPRESSION: Cardiomegaly, without acute disease.  Probable artifactual density projecting over the right upper lobe.  Consider repeat frontal radiograph with removal of EKG leads.  Possible nipple shadow projecting at the right lung base.  Consider nipple markers on repeat frontal.   Original Report Authenticated By: Jeronimo Greaves, M.D.   Dg Abd Portable 1v  01/29/2013   *RADIOLOGY REPORT*  Clinical Data: Status post stent graft placement  PORTABLE ABDOMEN - 1 VIEW  Comparison: 12/29/2012  Findings: The bifurcated aortic stent graft is now noted in place. Multiple coils are noted within the left internal iliac artery.  No acute abnormality is seen.   Original Report Authenticated By: Alcide Clever, M.D.    PHYSICAL EXAM patient  is oriented to person time and place. Affect is normal. Neck supple. Lungs reveal scattered rhonchi. Cardiac exam reveals irregular rhythm S1 and S2. The abdomen is soft. There is trace peripheral edema.   TELEMETRY: Atrial fibrillation with mildly elevated rate at times   ASSESSMENT AND PLAN:    S/p endovascular repair - per vascular surgery.    Atrial fibrillation     Atrial fibrillation is permanent; watch HR (mild elevation at times most likely related to recent surgery.    Status post aortic valve  replacement with bioprosthetic valve    Chronic anticoagulation      His Coumadin; continue heparin until INR > 2.    Acute on chronic renal failure    Acute diastolic CHF - patient mildly volume overloaded; lasix 40 mg IV x 1.    Hyponatremia - patient has been treated with NS; follow.  Olga Millers 02/03/2013 7:58 AM

## 2013-02-03 NOTE — Evaluation (Signed)
Physical Therapy Evaluation Patient Details Name: Evan Perez MRN: [id number] DOB: 1923/04/29 Today's Date: 02/03/2013 Time: 7829-5621 PT Time Calculation (min): 13 min  PT Assessment / Plan / Recommendation History of Present Illness  Pt underwent endovascular repair of abdominal aortic and left iliac aneurysm. Post-op oozing of rt groin wound.  Clinical Impression  Pt doing well with mobility and no further PT needed.  From PT standpoint pt okay to return to his independent apt at [facility].    PT Assessment  Patent does not need any further PT services    Follow Up Recommendations  No PT follow up    Does the patient have the potential to tolerate intense rehabilitation      Barriers to Discharge        Equipment Recommendations  None recommended by PT    Recommendations for Other Services     Frequency      Precautions / Restrictions Precautions Precautions: None   Pertinent Vitals/Pain See flow sheet      Mobility  Transfers Transfers: Sit to Stand;Stand to Sit Sit to Stand: 6: Modified independent (Device/Increase time) Stand to Sit: 6: Modified independent (Device/Increase time) Details for Transfer Assistance: Incr time Ambulation/Gait Ambulation/Gait Assistance: 7: Independent Ambulation Distance (Feet): 350 Feet Assistive device: None;Other (Comment) (IV Pole) Ambulation/Gait Assistance Details: Pushed IV pole but when let go of IV pole pt without difficulty Gait Pattern: Within Functional Limits    Exercises     PT Diagnosis:    PT Problem List:   PT Treatment Interventions:       PT Goals(Current goals can be found in the care plan section) Acute Rehab PT Goals PT Goal Formulation: No goals set, d/c therapy  Visit Information  Last PT Received On: 02/03/13 Assistance Needed: +1 History of Present Illness: Pt underwent endovascular repair of abdominal aortic and left iliac aneurysm. Post-op oozing of rt groin wound.       Prior  Functioning  Home Living Family/patient expects to be discharged to:: Private residence [facility] Independent apt.) Living Arrangements: Alone Type of Home: Apartment Home Access: Level entry Home Layout: One level Home Equipment: None Prior Function Level of Independence: Independent Communication Communication: No difficulties    Cognition  Cognition Arousal/Alertness: Awake/alert Behavior During Therapy: WFL for tasks assessed/performed Overall Cognitive Status: Within Functional Limits for tasks assessed    Extremity/Trunk Assessment Upper Extremity Assessment Upper Extremity Assessment: Overall WFL for tasks assessed Lower Extremity Assessment Lower Extremity Assessment: Overall WFL for tasks assessed   Balance Balance Balance Assessed: Yes Static Standing Balance Static Standing - Balance Support: No upper extremity supported Static Standing - Level of Assistance: 7: Independent  End of Session PT - End of Session Activity Tolerance: Patient tolerated treatment well Patient left: in chair;with call bell/phone within reach;with family/visitor present Nurse Communication: Mobility status  GP     [facility] 02/03/2013, 11:46 AM  Skip Mayer PT [phone]

## 2013-02-03 NOTE — Progress Notes (Addendum)
[facility] Progress Note  02/03/2013 7:30 AM 5 Days Post-Op  Subjective:  A little sore in the left groin; no BM  Tm 99.1 VSS 98% RA  Filed Vitals:   02/03/13 0520  BP: 124/58  Pulse: 90  Temp: 99.1 F (37.3 C)  Resp: 16    Physical Exam: Incisions:  + hematoma left groin with tenderness Abdomen:  Distended; -BM  CBC    Component Value Date/Time   WBC 7.0 02/03/2013 0555   RBC 3.11* 02/03/2013 0555   HGB 9.5* 02/03/2013 0555   HCT 27.6* 02/03/2013 0555   PLT 113* 02/03/2013 0555   MCV 88.7 02/03/2013 0555   MCH 30.5 02/03/2013 0555   MCHC 34.4 02/03/2013 0555   RDW 14.2 02/03/2013 0555   LYMPHSABS 1.0 01/15/2012 1632   MONOABS 0.9 01/15/2012 1632   EOSABS 0.6 01/15/2012 1632   BASOSABS 0.0 01/15/2012 1632    BMET    Component Value Date/Time   NA 125* 02/02/2013 0710   K 4.5 02/02/2013 0710   CL 93* 02/02/2013 0710   CO2 20 02/02/2013 0710   GLUCOSE 78 02/02/2013 0710   BUN 44* 02/02/2013 0710   CREATININE 2.46* 02/02/2013 0710   CREATININE 1.60* 12/29/2012 0821   CALCIUM 8.7 02/02/2013 0710   GFRNONAA 22* 02/02/2013 0710   GFRAA 25* 02/02/2013 0710    INR    Component Value Date/Time   INR 1.89* 02/03/2013 0555   INR 1.7 01/19/2013 1355     Intake/Output Summary (Last 24 hours) at 02/03/13 0730 Last data filed at 02/03/13 0525  Gross per 24 hour  Intake    480 ml  Output    900 ml  Net   -420 ml     Assessment/Plan:  79 y.o. male is s/p:  #1: Endovascular repair of abdominal aortic and left iliac aneurysm  #2: Ultrasound-guided access, bilateral common femoral artery  #3: Catheter and aorta x2  #4: Abdominal aortogram  5 Days Post-Op  -creatinine slightly improved today-hopefully will continue to improve -DVT prophylaxis: heparin/coumadin bridge -dulcolax for BM if prune juice doesn't work -INR is 1.89 today up from 1.40 yesterday -f/u in [facility] coumadin clinic for f/u of INR per cardiology   Doreatha Massed, PA-C [facility] [phone] 02/03/2013 7:30 AM    I have examined the patient, reviewed and agree with above.  Miosotis Wetsel, MD 02/03/2013 8:41 AM

## 2013-02-03 NOTE — Progress Notes (Signed)
ANTICOAGULATION CONSULT NOTE - Follow Up Consult  Pharmacy Consult for Heparin + Coumadin Indication: AVR/afib  Allergies  Allergen Reactions  . Dust Mite Extract   . Lipitor [Atorvastatin Calcium] Other (See Comments)    Nervousness     . Ramipril Other (See Comments)    Pt cannot remember reaction   . Zocor [Simvastatin] Other (See Comments)    Nervousness   . Lovenox [Enoxaparin Sodium] Rash    Rash on abdomen, back, lower legs.     Patient Measurements: Height: 5\' 10"  (177.8 cm) Weight: 176 lb 8 oz (80.06 kg) IBW/kg (Calculated) : 73 Heparin Dosing Weight: 80kg  Vital Signs: Temp: 99.1 F (37.3 C) (09/02 0520) Temp src: Oral (09/02 0520) BP: 124/58 mmHg (09/02 0520) Pulse Rate: 90 (09/02 0520)  Labs:  Recent Labs  02/01/13 0440  02/02/13 0710 02/02/13 1600 02/03/13 0555  HGB 9.0*  --  9.5*  --  9.5*  HCT 26.4*  --  27.2*  --  27.6*  PLT 83*  --  110*  --  113*  LABPROT 17.2*  --  16.8*  --  21.1*  INR 1.44  --  1.40  --  1.89*  HEPARINUNFRC <0.10*  < > 0.29* 0.32 0.35  CREATININE 2.73*  --  2.46*  --   --   < > = values in this interval not displayed.  Estimated Creatinine Clearance: 21.4 ml/min (by C-G formula based on Cr of 2.46).   Medications:  Heparin @ 1150 units/hr  Assessment: 89yom POD#5 endovascular repair of AAA continues on heparin to coumadin bridge for his avr/afib. Heparin level is therapeutic this morning. INR is subtherapeutic, but increasing toward goal after larger dose given last night. Left groin hematoma noted, but no overt bleeding. Hgb is low but stable and platelets are low but appear to be trending up.  Goal of Therapy:  Heparin level 0.3-0.7 units/ml INR 2-3 Monitor platelets by anticoagulation protocol: Yes   Plan:  1) Continue heparin at 1150 units/hr 2) Coumadin 5mg  x 1 (try to resume home regimen) 3) INR, heparin level, CBC in AM  Fredrik Rigger 02/03/2013,8:37 AM

## 2013-02-03 NOTE — Clinical Documentation Improvement (Signed)
THIS DOCUMENT IS NOT A PERMANENT PART OF THE MEDICAL RECORD  Please update your documentation with the medical record to reflect your response to this query. If you need help knowing how to do this please call [phone].  02/03/13  Dear Dr. Myra Gianotti Marton Redwood  In an effort to better capture your patient's severity of illness, reflect appropriate length of stay and utilization of resources, a review of the patient medical record has revealed the following indicators.    Based on your clinical judgment, please clarify and document in a progress note and/or discharge summary the clinical condition associated with the following supporting information:    Possible Clinical Conditions?   " Expected Acute Blood Loss Anemia  " Acute Blood Loss Anemia  " Acute on chronic blood loss anemia  " Precipitous drop in Hematocrit  " Other Condition  " Cannot Clinically Determine    Diagnostics: H&H on 8/27:  12.2/36.0 H&H on 8/31:   9.0/26.4   Reviewed: has had some anemia 2/2 the hematoma was hematocrit of 26 this morning, noted per 9/04 progress notes.  Thank You,  Marciano Sequin,  Clinical Documentation Specialist: [phone] Health Information Management [place]

## 2013-02-04 LAB — BASIC METABOLIC PANEL
Calcium: 8.9 mg/dL (ref 8.4–10.5)
Chloride: 97 mEq/L (ref 96–112)
Creatinine, Ser: 2.12 mg/dL — ABNORMAL HIGH (ref 0.50–1.35)
GFR calc Af Amer: 30 mL/min — ABNORMAL LOW (ref >90)
Sodium: 131 mEq/L — ABNORMAL LOW (ref 135–145)

## 2013-02-04 LAB — CBC
HCT: 29 % — ABNORMAL LOW (ref 39.0–52.0)
MCV: 89 fL (ref 78.0–100.0)
Platelets: 138 10*3/uL — ABNORMAL LOW (ref 150–400)
RBC: 3.26 MIL/uL — ABNORMAL LOW (ref 4.22–5.81)
RDW: 14.3 % (ref 11.5–15.5)
WBC: 8.3 10*3/uL (ref 4.0–10.5)

## 2013-02-04 LAB — HEPARIN LEVEL (UNFRACTIONATED)
Heparin Unfractionated: 0.24 IU/mL — ABNORMAL LOW (ref 0.30–0.70)
Heparin Unfractionated: 0.18 IU/mL — ABNORMAL LOW (ref 0.30–0.70)

## 2013-02-04 LAB — PROTIME-INR: INR: 1.91 — ABNORMAL HIGH (ref 0.00–1.49)

## 2013-02-04 MED ORDER — HEPARIN BOLUS VIA INFUSION
2000.0000 [IU] | Freq: Once | INTRAVENOUS | Status: AC
  Administered 2013-02-04: 2000 [IU] via INTRAVENOUS
  Filled 2013-02-04: qty 2000

## 2013-02-04 MED ORDER — MINERAL OIL RE ENEM
1.0000 | ENEMA | Freq: Once | RECTAL | Status: DC
  Filled 2013-02-04: qty 1

## 2013-02-04 MED ORDER — WARFARIN SODIUM 5 MG PO TABS
5.0000 mg | ORAL_TABLET | Freq: Once | ORAL | Status: DC
  Filled 2013-02-04: qty 1

## 2013-02-04 MED ORDER — HEPARIN (PORCINE) IN NACL 100-0.45 UNIT/ML-% IJ SOLN
1500.0000 [IU]/h | INTRAMUSCULAR | Status: DC
  Administered 2013-02-04 – 2013-02-05 (×2): 1500 [IU]/h via INTRAVENOUS
  Filled 2013-02-04 (×3): qty 250

## 2013-02-04 MED ORDER — WARFARIN SODIUM 7.5 MG PO TABS
7.5000 mg | ORAL_TABLET | Freq: Once | ORAL | Status: DC
  Filled 2013-02-04: qty 1

## 2013-02-04 MED ORDER — WARFARIN SODIUM 5 MG PO TABS
5.0000 mg | ORAL_TABLET | Freq: Once | ORAL | Status: AC
  Administered 2013-02-04: 5 mg via ORAL
  Filled 2013-02-04: qty 1

## 2013-02-04 MED ORDER — FUROSEMIDE 10 MG/ML IJ SOLN
40.0000 mg | Freq: Once | INTRAMUSCULAR | Status: AC
  Administered 2013-02-04: 40 mg via INTRAVENOUS
  Filled 2013-02-04: qty 4

## 2013-02-04 NOTE — Progress Notes (Addendum)
[facility] Progress Note  02/04/2013 8:20 AM 6 Days Post-Op  Subjective:  "I need to have a bowel movement"  Tm 99.5 now afebrile HR 60's-90's irregular 110's-130's systolic 92% RA  Filed Vitals:   02/04/13 0428  BP: 117/66  Pulse: 85  Temp: 98.7 F (37.1 C)  Resp: 16    Physical Exam: Incisions:  Left groin still with hematoma and tender to palpation; bilateral groins with ecchymosis Extremities:  Bilateral feet are warm Abdomen:  Distended; NT; +BS; -BM  CBC    Component Value Date/Time   WBC 8.3 02/04/2013 0600   RBC 3.26* 02/04/2013 0600   HGB 9.8* 02/04/2013 0600   HCT 29.0* 02/04/2013 0600   PLT 138* 02/04/2013 0600   MCV 89.0 02/04/2013 0600   MCH 30.1 02/04/2013 0600   MCHC 33.8 02/04/2013 0600   RDW 14.3 02/04/2013 0600   LYMPHSABS 1.0 01/15/2012 1632   MONOABS 0.9 01/15/2012 1632   EOSABS 0.6 01/15/2012 1632   BASOSABS 0.0 01/15/2012 1632    BMET    Component Value Date/Time   NA 131* 02/04/2013 0600   K 4.6 02/04/2013 0600   CL 97 02/04/2013 0600   CO2 23 02/04/2013 0600   GLUCOSE 114* 02/04/2013 0600   BUN 35* 02/04/2013 0600   CREATININE 2.12* 02/04/2013 0600   CREATININE 1.60* 12/29/2012 0821   CALCIUM 8.9 02/04/2013 0600   GFRNONAA 26* 02/04/2013 0600   GFRAA 30* 02/04/2013 0600    INR    Component Value Date/Time   INR 1.91* 02/04/2013 0600   INR 1.7 01/19/2013 1355     Intake/Output Summary (Last 24 hours) at 02/04/13 0820 Last data filed at 02/03/13 1700  Gross per 24 hour  Intake    480 ml  Output    400 ml  Net     80 ml     Assessment/Plan:  79 y.o. male is s/p:  #1: Endovascular repair of abdominal aortic and left iliac aneurysm  #2: Ultrasound-guided access, bilateral common femoral artery  #3: Catheter and aorta x2  #4: Abdominal aortogram   6 Days Post-Op  -abdomen is more distended today-pt still without BM-will order fleets mineral oil enema today; pt denies any nausea or vomiting and is passing flatus. -DVT prophylaxis:   Heparin/coumadin bridge -INR is 1.91 today-d/c when INR > 2-continue coumadin/heparin per pharmacy -mild volume overload today and repeating lasix per cards -BUN/Cr improved this am   Doreatha Massed, PA-C [facility] [phone] 02/04/2013 8:20 AM    I have examined the patient, reviewed and agree with above. Left groin hematoma stable.  + BM.  Alanis Clift, MD 02/04/2013 1:35 PM

## 2013-02-04 NOTE — Progress Notes (Signed)
ANTICOAGULATION CONSULT NOTE - Follow Up Consult  Pharmacy Consult for Heparin + Coumadin Indication: AVR/afib  Allergies  Allergen Reactions  . Dust Mite Extract   . Lipitor [Atorvastatin Calcium] Other (See Comments)    Nervousness     . Ramipril Other (See Comments)    Pt cannot remember reaction   . Zocor [Simvastatin] Other (See Comments)    Nervousness   . Lovenox [Enoxaparin Sodium] Rash    Rash on abdomen, back, lower legs.     Patient Measurements: Height: 5\' 10"  (177.8 cm) Weight: 176 lb 8 oz (80.06 kg) IBW/kg (Calculated) : 73 Heparin Dosing Weight: 80kg  Vital Signs: Temp: 98.7 F (37.1 C) (09/03 0428) Temp src: Oral (09/03 0428) BP: 117/66 mmHg (09/03 0428) Pulse Rate: 85 (09/03 0428)  Labs:  Recent Labs  02/02/13 0710 02/02/13 1600 02/03/13 0555 02/04/13 0600  HGB 9.5*  --  9.5* 9.8*  HCT 27.2*  --  27.6* 29.0*  PLT 110*  --  113* 138*  LABPROT 16.8*  --  21.1* 21.3*  INR 1.40  --  1.89* 1.91*  HEPARINUNFRC 0.29* 0.32 0.35 0.24*  CREATININE 2.46*  --   --   --     Estimated Creatinine Clearance: 21.4 ml/min (by C-G formula based on Cr of 2.46).   Medications:  Heparin @ 1150 units/hr  Assessment: 89yom POD#6 endovascular repair of AAA continues on heparin to coumadin bridge for his avr/afib. Heparin level is subtherapeutic this morning. INR remains subtherapeutic. Left groin hematoma noted, but no overt bleeding. Hgb is low but stable and platelets are trending up.  Goal of Therapy:  Heparin level 0.3-0.7 units/ml INR 2-3 Monitor platelets by anticoagulation protocol: Yes   Plan:  1) Increase heparin to 1300 units/hr 2) Coumadin 5mg  x 1 (try to resume home regimen) 3) F/u 8 hr heparin level 4) Daily INR, heparin level, and CBC  Christoper Fabian, PharmD, BCPS Clinical pharmacist, pager [phone] 02/04/2013,6:39 AM

## 2013-02-04 NOTE — Progress Notes (Signed)
ANTICOAGULATION CONSULT NOTE - Follow Up Consult  Pharmacy Consult for Heparin Indication: AVR/afib  Allergies  Allergen Reactions  . Dust Mite Extract   . Lipitor [Atorvastatin Calcium] Other (See Comments)    Nervousness     . Ramipril Other (See Comments)    Pt cannot remember reaction   . Zocor [Simvastatin] Other (See Comments)    Nervousness   . Lovenox [Enoxaparin Sodium] Rash    Rash on abdomen, back, lower legs.     Patient Measurements: Height: 5\' 10"  (177.8 cm) Weight: 176 lb 8 oz (80.06 kg) IBW/kg (Calculated) : 73 Heparin Dosing Weight: 80kg  Vital Signs: Temp: 97.7 F (36.5 C) (09/03 1335) Temp src: Oral (09/03 1335) BP: 146/71 mmHg (09/03 1335) Pulse Rate: 87 (09/03 1335)  Labs:  Recent Labs  02/02/13 0710  02/03/13 0555 02/04/13 0600 02/04/13 1604  HGB 9.5*  --  9.5* 9.8*  --   HCT 27.2*  --  27.6* 29.0*  --   PLT 110*  --  113* 138*  --   LABPROT 16.8*  --  21.1* 21.3*  --   INR 1.40  --  1.89* 1.91*  --   HEPARINUNFRC 0.29*  < > 0.35 0.24* 0.18*  CREATININE 2.46*  --   --  2.12*  --   < > = values in this interval not displayed.  Estimated Creatinine Clearance: 24.9 ml/min (by C-G formula based on Cr of 2.12).   Medications:  Heparin @ 1300 units/hr  Assessment: 89yom s/p endovascular repair of AAA continues on heparin to coumadin bridge for his AVR/Afib. Heparin level is was subtherapeutic this morning and the rate was increased. A heparin level drawn after the rate increase this morning came back even lower and below goal at 0.18. Discussed with RN and the gtt is running at the correct rate and there are no issues with the infusion, nor has it been held for any reason. Left groin hematoma noted, but no overt bleeding and this is stable per RN. CBC is also low but stable, with platelets increasing over the past few days. INR this morning was 1.91- nearly therapeutic.  Goal of Therapy:  INR 2-3 Heparin level 0.3-0.7 units/ml Monitor  platelets by anticoagulation protocol: Yes   Plan:  1. Heparin bolus with 2000units x1 2. Increase heparin gtt to 1500 units/hr 3. Recheck heparin level in 8 hours 4. Follow daily INR and HL  Vern Guerette D. Bevelyn Arriola, PharmD Clinical Pharmacist Pager: [phone] 02/04/2013 5:53 PM

## 2013-02-04 NOTE — Progress Notes (Signed)
Pt states he had a large loose bm. Pt refused enema for now.

## 2013-02-04 NOTE — Progress Notes (Signed)
   Subjective:  No chest pain.  Mild dyspnea.  Still no bowel movement. Weight today not yet recorded.  Objective:  Vital Signs in the last 24 hours: Temp:  [98.2 F (36.8 C)-99.5 F (37.5 C)] 98.7 F (37.1 C) (09/03 0428) Pulse Rate:  [61-121] 85 (09/03 0428) Resp:  [16-18] 16 (09/03 0428) BP: (117-152)/(66-69) 117/66 mmHg (09/03 0428) SpO2:  [92 %-100 %] 92 % (09/03 0428)  Intake/Output from previous day: 09/02 0701 - 09/03 0700 In: 720 [P.O.:720] Out: 400 [Urine:400] Intake/Output from this shift:    . aspirin EC  81 mg Oral Q M,W,F  . azelastine  1 spray Each Nare BID  . cloNIDine  0.2 mg Oral BID  . colesevelam  1,250 mg Oral BID WC  . docusate sodium  100 mg Oral Daily  . fluticasone  1 spray Each Nare Daily  . levothyroxine  25 mcg Oral QAC breakfast  . loratadine  10 mg Oral Daily  . multivitamin with minerals  1 tablet Oral Daily  . pantoprazole  40 mg Oral Daily  . tiotropium  18 mcg Inhalation Daily  . warfarin  5 mg Oral ONCE-1800  . Warfarin - Pharmacist Dosing Inpatient   Does not apply q1800   . sodium chloride 50 mL/hr (02/03/13 2146)  . heparin 1,300 Units/hr (02/04/13 1610)    Physical Exam: The patient appears to be in no distress.  Head and neck exam reveals that the pupils are equal and reactive.  The extraocular movements are full.  There is no scleral icterus.  Mouth and pharynx are benign.  No lymphadenopathy.  No carotid bruits.  The jugular venous pressure is normal.  Thyroid is not enlarged or tender.  Chest Mild expiratory wheeze.  Heart reveals good prosthetic heart tones.  The abdomen is distended. Bowel sounds are good,.  Extremities reveal no phlebitis. Trace edema.  Pedal pulses are good.  There is no cyanosis or clubbing.  Neurologic exam is normal strength and no lateralizing weakness.  No sensory deficits.  Integument reveals no rash  Lab Results:  Recent Labs  02/03/13 0555 02/04/13 0600  WBC 7.0 8.3  HGB 9.5* 9.8*    PLT 113* 138*    Recent Labs  02/02/13 0710 02/04/13 0600  NA 125* 131*  K 4.5 4.6  CL 93* 97  CO2 20 23  GLUCOSE 78 114*  BUN 44* 35*  CREATININE 2.46* 2.12*   No results found for this basename: TROPONINI, CK, MB,  in the last 72 hours Hepatic Function Panel No results found for this basename: PROT, ALBUMIN, AST, ALT, ALKPHOS, BILITOT, BILIDIR, IBILI,  in the last 72 hours No results found for this basename: CHOL,  in the last 72 hours No results found for this basename: PROTIME,  in the last 72 hours  Imaging: No results found.  Cardiac Studies: Telemetry atrial fib with controlled VR Assessment/Plan:  S/p endovascular repair - per vascular surgery.  Atrial fibrillation  Atrial fibrillation is permanent; watch HR (mild elevation at times most likely related to recent surgery.  Status post aortic valve replacement with bioprosthetic valve  Chronic anticoagulation  His Coumadin; continue heparin until INR > 2.  Acute on chronic renal failure improving. Acute diastolic CHF - patient mildly volume overloaded;will repeat lasix today. Hyponatremia - improved   LOS: 9 days    Cassell Clement 02/04/2013, 7:27 AM

## 2013-02-05 LAB — HEPARIN LEVEL (UNFRACTIONATED): Heparin Unfractionated: 0.36 IU/mL (ref 0.30–0.70)

## 2013-02-05 LAB — CBC
MCHC: 34.1 g/dL (ref 30.0–36.0)
RDW: 14.3 % (ref 11.5–15.5)
WBC: 7.9 10*3/uL (ref 4.0–10.5)

## 2013-02-05 LAB — PROTIME-INR: Prothrombin Time: 25.6 seconds — ABNORMAL HIGH (ref 11.6–15.2)

## 2013-02-05 MED ORDER — WARFARIN SODIUM 2.5 MG PO TABS
2.5000 mg | ORAL_TABLET | Freq: Once | ORAL | Status: AC
  Administered 2013-02-05: 2.5 mg via ORAL
  Filled 2013-02-05: qty 1

## 2013-02-05 MED ORDER — METOPROLOL SUCCINATE 12.5 MG HALF TABLET
12.5000 mg | ORAL_TABLET | Freq: Every day | ORAL | Status: DC
  Administered 2013-02-05: 12.5 mg via ORAL
  Filled 2013-02-05 (×2): qty 1

## 2013-02-05 MED ORDER — WARFARIN SODIUM 5 MG PO TABS
5.0000 mg | ORAL_TABLET | Freq: Once | ORAL | Status: DC
  Filled 2013-02-05: qty 1

## 2013-02-05 NOTE — Progress Notes (Signed)
Subjective: Interval History: none.. slept well last night. Comfortable after 2 bowel movements yesterday  Objective: Vital signs in last 24 hours: Temp:  [97.5 F (36.4 C)-98.3 F (36.8 C)] 98.3 F (36.8 C) (09/04 0540) Pulse Rate:  [87-103] 89 (09/04 0540) Resp:  [17-18] 18 (09/04 0540) BP: (133-183)/(69-94) 133/70 mmHg (09/04 0540) SpO2:  [94 %-99 %] 98 % (09/04 0540)  Intake/Output from previous day: 09/03 0701 - 09/04 0700 In: 1920 [P.O.:720; I.V.:1200] Out: 2102 [Urine:2100; Stool:2] Intake/Output this shift: Total I/O In: 1200 [I.V.:1200] Out: 800 [Urine:800]  No change in physical exam. Left groin with moderate hematoma. No evidence of false aneurysm  Lab Results:  Recent Labs  02/04/13 0600 02/05/13 0300  WBC 8.3 7.9  HGB 9.8* 8.9*  HCT 29.0* 26.1*  PLT 138* 124*   BMET  Recent Labs  02/02/13 0710 02/04/13 0600  NA 125* 131*  K 4.5 4.6  CL 93* 97  CO2 20 23  GLUCOSE 78 114*  BUN 44* 35*  CREATININE 2.46* 2.12*  CALCIUM 8.7 8.9    Studies/Results: Dg Chest Portable 1 View  01/29/2013   *RADIOLOGY REPORT*  Clinical Data: Stent graft placement.  PORTABLE CHEST - 1 VIEW  Comparison: Chest x-ray 01/26/2013.  Findings: There is cephalization of the pulmonary vasculature and slight indistinctness of the interstitial markings suggestive of mild pulmonary edema.  Moderate cardiomegaly with prominence of the right atrial contour.  Obscuration of the left cardiac border suggesting atelectasis and/or consolidation in the lingula. Possible trace right pleural effusion.  Upper mediastinal contours are within normal limits.  Atherosclerosis in the thoracic aorta. Status post median sternotomy for aortic valve replacement (a stented bioprosthesis is noted).  IMPRESSION: 1.  Interval development of mild congestive heart failure. 2.  Opacity partially obscuring the left heart border concerning for atelectasis and/or consolidation in the lingula. 3.  Moderate cardiomegaly  is unchanged. 4.  Atherosclerosis.   Original Report Authenticated By: Trudie Reed, M.D.   Dg Chest Portable 1 View  01/26/2013   *RADIOLOGY REPORT*  Clinical Data: Preop for abdominal aortic aneurysm.  Hypertension. Ex-smoker.  PORTABLE CHEST - 1 VIEW  Comparison: 05/13/2012  Findings: Mild hyperinflation. Prior median sternotomy. Midline trachea.  Mild cardiomegaly with atherosclerosis in the transverse aorta. No pleural effusion or pneumothorax.  Apparent nodular density projecting over the right upper lobe is most likely related to the overlying EKG lead.  There is also left base scarring. Nodular density projects over the right lung base laterally.  IMPRESSION: Cardiomegaly, without acute disease.  Probable artifactual density projecting over the right upper lobe.  Consider repeat frontal radiograph with removal of EKG leads.  Possible nipple shadow projecting at the right lung base.  Consider nipple markers on repeat frontal.   Original Report Authenticated By: Jeronimo Greaves, M.D.   Dg Abd Portable 1v  01/29/2013   *RADIOLOGY REPORT*  Clinical Data: Status post stent graft placement  PORTABLE ABDOMEN - 1 VIEW  Comparison: 12/29/2012  Findings: The bifurcated aortic stent graft is now noted in place. Multiple coils are noted within the left internal iliac artery.  No acute abnormality is seen.   Original Report Authenticated By: Alcide Clever, M.D.   Anti-infectives: Anti-infectives   Start     Dose/Rate Route Frequency Ordered Stop   01/29/13 2000  cefUROXime (ZINACEF) 1.5 g in dextrose 5 % 50 mL IVPB     1.5 g 100 mL/hr over 30 Minutes Intravenous Every 12 hours 01/29/13 1236 01/30/13 0947   01/29/13 0830  cefUROXime (ZINACEF) 1.5 g in dextrose 5 % 50 mL IVPB     1.5 g 100 mL/hr over 30 Minutes Intravenous To Surgery 01/29/13 0829 01/29/13 0825   01/29/13 0600  cefUROXime (ZINACEF) 1.5 g in dextrose 5 % 50 mL IVPB  Status:  Discontinued     1.5 g 100 mL/hr over 30 Minutes Intravenous 30 min  pre-op 01/26/13 1926 01/29/13 1210   01/26/13 1745  cefUROXime (ZINACEF) 1.5 g in dextrose 5 % 50 mL IVPB  Status:  Discontinued     1.5 g 100 mL/hr over 30 Minutes Intravenous 30 min pre-op 01/26/13 1745 01/26/13 1926      Assessment/Plan: s/p Procedure(s): ABDOMINAL AORTIC ENDOVASCULAR STENT GRAFT - GORE; ULTRASOUND GUIDED (N/A) Stable over all. Has had some anemia secondary to the hematoma was hematocrit of 26 this morning. Asymptomatic from this. INR pending. Potential home today if INR therapeutic.   LOS: 10 days   Evan Perez 02/05/2013, 6:34 AM

## 2013-02-05 NOTE — Progress Notes (Signed)
ANTICOAGULATION CONSULT NOTE - Follow Up Consult  Pharmacy Consult for Coumadin Indication: bioprosthetic AVR/afib  Allergies  Allergen Reactions  . Dust Mite Extract   . Lipitor [Atorvastatin Calcium] Other (See Comments)    Nervousness     . Ramipril Other (See Comments)    Pt cannot remember reaction   . Zocor [Simvastatin] Other (See Comments)    Nervousness   . Lovenox [Enoxaparin Sodium] Rash    Rash on abdomen, back, lower legs.     Patient Measurements: Height: 5\' 10"  (177.8 cm) Weight: 180 lb (81.647 kg) IBW/kg (Calculated) : 73  Vital Signs: Temp: 97.6 F (36.4 C) (09/04 1400) Temp src: Oral (09/04 1400) BP: 113/56 mmHg (09/04 1400) Pulse Rate: 79 (09/04 1400)  Labs:  Recent Labs  02/03/13 0555 02/04/13 0600 02/04/13 1604 02/05/13 0300  HGB 9.5* 9.8*  --  8.9*  HCT 27.6* 29.0*  --  26.1*  PLT 113* 138*  --  124*  LABPROT 21.1* 21.3*  --  25.6*  INR 1.89* 1.91*  --  2.43*  HEPARINUNFRC 0.35 0.24* 0.18* 0.36  CREATININE  --  2.12*  --   --     Estimated Creatinine Clearance: 24.9 ml/min (by C-G formula based on Cr of 2.12).   Assessment: Evan Perez s/p endovascular repair of AAA on heparin to coumadin bridge for his AVR/Afib. Heparin level therapeutic this morning on 1500 units/hr drip.  INR trended up from 1.91 to 2.43;  heparin has been discontinued.  Anemia, Hgb and platelet count low.  Left groin hematoma noted unchanged.  Home Coumadin regimen: 5 mg daily except 7.5 mg on Mondays & Fridays.  Goal of Therapy:  INR 2-3 Monitor platelets by anticoagulation protocol: Yes   Plan:   Will decrease today's Coumadin dose to 2.5 mg, to try to keep INR at lower end of therapeutic range for now.  Continue daily PT/INR.  May need 5 mg dose on 9/5, versus usual Friday dose of 7.5 mg.  Dennie Fetters, [place] Pager: [phone] 02/05/2013 3:57 PM

## 2013-02-05 NOTE — Progress Notes (Signed)
ANTICOAGULATION CONSULT NOTE - Follow Up Consult  Pharmacy Consult for Heparin Indication: AVR/afib  Allergies  Allergen Reactions  . Dust Mite Extract   . Lipitor [Atorvastatin Calcium] Other (See Comments)    Nervousness     . Ramipril Other (See Comments)    Pt cannot remember reaction   . Zocor [Simvastatin] Other (See Comments)    Nervousness   . Lovenox [Enoxaparin Sodium] Rash    Rash on abdomen, back, lower legs.     Patient Measurements: Height: 5\' 10"  (177.8 cm) Weight: 176 lb 8 oz (80.06 kg) IBW/kg (Calculated) : 73 Heparin Dosing Weight: 80kg  Vital Signs: Temp: 97.5 F (36.4 C) (09/03 2027) Temp src: Oral (09/03 2027) BP: 147/69 mmHg (09/03 2206) Pulse Rate: 103 (09/03 2027)  Labs:  Recent Labs  02/02/13 0710  02/03/13 0555 02/04/13 0600 02/04/13 1604 02/05/13 0300  HGB 9.5*  --  9.5* 9.8*  --  8.9*  HCT 27.2*  --  27.6* 29.0*  --  26.1*  PLT 110*  --  113* 138*  --  124*  LABPROT 16.8*  --  21.1* 21.3*  --   --   INR 1.40  --  1.89* 1.91*  --   --   HEPARINUNFRC 0.29*  < > 0.35 0.24* 0.18* 0.36  CREATININE 2.46*  --   --  2.12*  --   --   < > = values in this interval not displayed.  Estimated Creatinine Clearance: 24.9 ml/min (by C-G formula based on Cr of 2.12).   Medications:  Heparin @ 1500 units/hr  Assessment: 89yom s/p endovascular repair of AAA continues on heparin to coumadin bridge for his AVR/Afib. Heparin level therapeutic. No bleeding noted.  Goal of Therapy:  INR 2-3 Heparin level 0.3-0.7 units/ml Monitor platelets by anticoagulation protocol: Yes   Plan:  1. Continue heparin gtt at 1500 units/hr 2. Follow daily INR and HL  Christoper Fabian, PharmD, BCPS Clinical pharmacist, pager [phone] 02/05/2013 4:00 AM

## 2013-02-05 NOTE — Progress Notes (Signed)
Subjective:  Still mildly dyspneic.  Rhythm is chronic atrial fib with increased heart rate to 160s when exerting. Patient not aware of any chest discomfort. Objective:  Vital Signs in the last 24 hours: Temp:  [97.5 F (36.4 C)-98.3 F (36.8 C)] 98.3 F (36.8 C) (09/04 0540) Pulse Rate:  [87-103] 89 (09/04 0540) Resp:  [17-18] 18 (09/04 0540) BP: (133-183)/(69-94) 133/70 mmHg (09/04 0540) SpO2:  [94 %-99 %] 98 % (09/04 0540)  Intake/Output from previous day: 09/03 0701 - 09/04 0700 In: 1920 [P.O.:720; I.V.:1200] Out: 2102 [Urine:2100; Stool:2] Intake/Output from this shift:    . aspirin EC  81 mg Oral Q M,W,F  . azelastine  1 spray Each Nare BID  . cloNIDine  0.2 mg Oral BID  . colesevelam  1,250 mg Oral BID WC  . docusate sodium  100 mg Oral Daily  . fluticasone  1 spray Each Nare Daily  . levothyroxine  25 mcg Oral QAC breakfast  . loratadine  10 mg Oral Daily  . mineral oil  1 enema Rectal Once  . multivitamin with minerals  1 tablet Oral Daily  . pantoprazole  40 mg Oral Daily  . tiotropium  18 mcg Inhalation Daily  . Warfarin - Pharmacist Dosing Inpatient   Does not apply q1800   . sodium chloride 50 mL/hr at 02/05/13 0558  . heparin 1,500 Units/hr (02/05/13 0556)    Physical Exam: The patient appears to be in no distress.  Head and neck exam reveals that the pupils are equal and reactive.  The extraocular movements are full.  There is no scleral icterus.  Mouth and pharynx are benign.  No lymphadenopathy.  No carotid bruits.  The jugular venous pressure is normal.  Thyroid is not enlarged or tender.  Chest Mild expiratory wheeze.  Heart reveals good prosthetic heart tones.  The abdomen is distended. Bowel sounds are good,.  Extremities reveal no phlebitis. Trace edema.  Pedal pulses are good.  There is no cyanosis or clubbing.  Neurologic exam is normal strength and no lateralizing weakness.  No sensory deficits.  Integument reveals no rash  Lab  Results:  Recent Labs  02/04/13 0600 02/05/13 0300  WBC 8.3 7.9  HGB 9.8* 8.9*  PLT 138* 124*    Recent Labs  02/04/13 0600  NA 131*  K 4.6  CL 97  CO2 23  GLUCOSE 114*  BUN 35*  CREATININE 2.12*   No results found for this basename: TROPONINI, CK, MB,  in the last 72 hours Hepatic Function Panel No results found for this basename: PROT, ALBUMIN, AST, ALT, ALKPHOS, BILITOT, BILIDIR, IBILI,  in the last 72 hours No results found for this basename: CHOL,  in the last 72 hours No results found for this basename: PROTIME,  in the last 72 hours  Imaging: No results found.  Cardiac Studies: Telemetry atrial fib with rapid VR with activity. Assessment/Plan:  S/p endovascular repair - per vascular surgery.  Atrial fibrillation  Atrial fibrillation is permanent; Status post aortic valve replacement with bioprosthetic valve  Chronic anticoagulation  His Coumadin; continue heparin until INR > 2.  Acute on chronic renal failure improving. Acute diastolic CHF - patient mildly volume overloaded Hyponatremia - improved  Plan: Will add low dose Toprol 12.5 mg daily to help with rate control. Some of rapid rate also related to anemia. Resume daily weights. Possibly home tomorrow if stable.   LOS: 10 days    Evan Perez 02/05/2013, 10:47 AM

## 2013-02-05 NOTE — Progress Notes (Addendum)
INR not back yet at 1010.  I called the lab for results and the order was cancelled and not re-ordered.  Stat order for INR placed.  Scherry Laverne 02/05/2013 10:10 AM   INR 2.43.  Heparin discontinued.  Check INR in the am.  Adelei Scobey, [facility] 02/05/2013 1:06 PM

## 2013-02-05 NOTE — Discharge Summary (Signed)
[facility] Discharge Summary  Evan Perez [birth year] 79 y.o. male  [id number]  Admission Date: 01/26/2013  Discharge Date: 02/06/13  Physician: Nada Libman, MD  Admission Diagnosis: Abdominal Aortic Aneurysm AAA, Anticoagulantion Abdominal Aortic Aneurysm   HPI:   This is a 79 y.o. male that I had seen for evaluation of an aortoiliac aneurysm. This was just detected on ultrasound. 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 The patient is without abdominal or back pain. He is very active.  The patient has history of hypertension which is treated medically. He also suffers from coronary artery disease. He is status post coronary artery bypass grafting in November of 2011. He had aortic valve replacement with a by a prosthetic valve. He is taking Coumadin for atrial fibrillation. He takes a statin for hypercholesterolemia. He underwent angiography and coiling of his left hypogastric artery, in anticipation of coverage with a stent graft. 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  Hospital Course:  The patient was admitted to the hospital for coumadin/heparin bridge.  The morning before surgery, his INR was still slightly elevated and he was given 2.5 mg of Vit K orally.   He was taken to the operating room on 01/29/2013 and underwent: #1: Endovascular repair of abdominal aortic and left iliac aneurysm  #2: Ultrasound-guided access, bilateral common femoral artery  #3: Catheter and aorta x2  #4: Abdominal aortogram    The pt tolerated the procedure well and was transported to the PACU in good condition. By POD 1, his BUN/Cr were elevated.  His ARB was discontinued.  He was transferred to the telemetry floor.  His coumadin was restarted POD 1.  On POD 2, he did have some oozing from the right groin.  Pressure was held and his heparin was held.  Later that day, he continued to have minimal oozing and a 3-0 nylon stitch was placed  and he had no further bleeding.  He did also have swelling in the left groin.  A duplex was obtained to rule out pseudoaneurysm.  This was negative for pseudoaneurysm and positive for hematoma.    On POD 3, the pt was gently hydrated due to his increased creatinine, which did improve.    By POD 5, the pt was mildly volume overloaded and did receive lasix several times post operatively.  The pt's hyponatremia also improved.  Also post operatively, the pt did have some abdominal distention and had not had a BM, he was able to have a BM without enema and his abdomen became softer.  He did have acute surgical blood loss anemia, which he did tolerate and did not receive any PRBC's.  The remainder of the hospital course consisted of increasing mobilization and increasing intake of solids without difficulty.  CBC    Component Value Date/Time   WBC 7.9 02/05/2013 0300   RBC 2.95* 02/05/2013 0300   HGB 8.9* 02/05/2013 0300   HCT 26.1* 02/05/2013 0300   PLT 124* 02/05/2013 0300   MCV 88.5 02/05/2013 0300   MCH 30.2 02/05/2013 0300   MCHC 34.1 02/05/2013 0300   RDW 14.3 02/05/2013 0300   LYMPHSABS 1.0 01/15/2012 1632   MONOABS 0.9 01/15/2012 1632   EOSABS 0.6 01/15/2012 1632   BASOSABS 0.0 01/15/2012 1632    BMET    Component Value Date/Time   NA 131* 02/04/2013 0600   K 4.6 02/04/2013 0600   CL 97 02/04/2013 0600  CO2 23 02/04/2013 0600   GLUCOSE 114* 02/04/2013 0600   BUN 35* 02/04/2013 0600   CREATININE 2.12* 02/04/2013 0600   CREATININE 1.60* 12/29/2012 0821   CALCIUM 8.9 02/04/2013 0600   GFRNONAA 26* 02/04/2013 0600   GFRAA 30* 02/04/2013 0600     Discharge Instructions:   The patient is discharged to home with extensive instructions on wound care and progressive ambulation.  They are instructed not to drive or perform any heavy lifting until returning to see the physician in his office.  Discharge Orders   Future Appointments Provider Department Dept Phone   02/11/2013 9:30 AM Cassell Clement, MD [facility] Main Office [place]) [phone]   02/11/2013 9:45 AM [facility] Main Office [place]) [phone]   03/09/2013 1:00 PM Nada Libman, MD [facility] [phone]   Future Orders Complete By Expires   ABDOMINAL PROCEDURE/ANEURYSM REPAIR/AORTO-BIFEMORAL BYPASS:  Call MD for increased abdominal pain; cramping diarrhea; nausea/vomiting  As directed    ABDOMINAL PROCEDURE/ANEURYSM REPAIR/AORTO-BIFEMORAL BYPASS:  Call MD for increased abdominal pain; cramping diarrhea; nausea/vomiting  As directed    Call MD for:  redness, tenderness, or signs of infection (pain, swelling, bleeding, redness, odor or green/yellow discharge around incision site)  As directed    Call MD for:  redness, tenderness, or signs of infection (pain, swelling, bleeding, redness, odor or green/yellow discharge around incision site)  As directed    Call MD for:  severe or increased pain, loss or decreased feeling  in affected limb(s)  As directed    Call MD for:  severe or increased pain, loss or decreased feeling  in affected limb(s)  As directed    Call MD for:  temperature >100.5  As directed    Call MD for:  temperature >100.5  As directed    Discharge wound care:  As directed    Comments:     Shower daily with soap and water.  Wash the groin wound with soap and water daily and pat dry. (No tub bath-only shower)  Then put a dry gauze or washcloth there to keep this area dry daily and as needed.  Do not use Vaseline or neosporin on your incisions.  Only use soap and water on your incisions and then protect and keep dry.   Discharge wound care:  As directed    Comments:     Shower daily with soap and water starting 02/05/13   Driving Restrictions  As directed    Comments:     No driving for 2 weeks   Driving Restrictions  As directed    Comments:     No driving for 2 weeks   Lifting restrictions  As directed    Comments:     No lifting for 4 weeks   Lifting  restrictions  As directed    Comments:     No lifting for 6 weeks   Resume previous diet  As directed    Resume previous diet  As directed       Discharge Diagnosis:  Abdominal Aortic Aneurysm AAA, Anticoagulantion Abdominal Aortic Aneurysm  Secondary Diagnosis: Patient Active Problem List   Diagnosis Date Noted  . Acute on chronic renal failure 02/01/2013  . Chronic anticoagulation 01/26/2013  . Occlusion and stenosis of carotid artery without mention of cerebral infarction 12/29/2012  . Abdominal aneurysm without mention of rupture 12/15/2012  . Status post aortic valve replacement with bioprosthetic valve 01/15/2012  . Perennial allergic rhinitis 03/13/2011  .  Drug-induced skin rash 09/12/2010  . AORTIC STENOSIS, SEVERE 11/01/2009  . DYSPNEA 11/01/2009  . BACK PAIN 10/03/2009  . POLYMYALGIA RHEUMATICA 04/01/2009  . ERECTILE DYSFUNCTION, ORGANIC 10/26/2008  . URI 06/24/2008  . GOUT 03/10/2007  . HYPERLIPIDEMIA 12/02/2006  . HYPERTENSION 12/02/2006  . Atrial fibrillation 12/02/2006  . BENIGN PROSTATIC HYPERTROPHY 12/02/2006   Past Medical History  Diagnosis Date  . History of aortic stenosis     Severe s/p bovine pericardial tissue AVR 04/2010  . Hypertension   . Fall 2011    FRACTURE VERTEBRAL  . CHF (congestive heart failure)     LV DYSFUNCTION -EF 55% JAN 2012  . Permanent atrial fibrillation     On chronic Coumadin anticoagulation  . Cancer   . CAD (coronary artery disease)     Nonobstructive by 2011 cath  . Hyperlipidemia   . Complication of anesthesia     "drowsy for 2 days after OR" (01/26/2013)  . AAA (abdominal aortic aneurysm)     AAA/left iliac aneurysm (3.9 cm)/notes 01/26/2013      Medication List    STOP taking these medications       Rivaroxaban 15 MG Tabs tablet  Commonly known as:  XARELTO     valsartan-hydrochlorothiazide 160-12.5 MG per tablet  Commonly known as:  DIOVAN HCT      TAKE these medications       aspirin 81 MG tablet   Take 81 mg by mouth every Monday, Wednesday, and Friday.     azelastine 137 MCG/SPRAY nasal spray  Commonly known as:  ASTELIN  Place 1 spray into the nose 2 (two) times daily. Use in each nostril as directed     betamethasone valerate 0.1 % cream  Commonly known as:  VALISONE  Apply 1 application topically daily as needed (rash).     cloNIDine 0.1 MG tablet  Commonly known as:  CATAPRES  Take 2 tablets (0.2 mg total) by mouth 2 (two) times daily.     colesevelam 625 MG tablet  Commonly known as:  WELCHOL  Take 1,250 mg by mouth 2 (two) times daily with a meal.     furosemide 20 MG tablet  Commonly known as:  LASIX  Take 1 tablet (20 mg total) by mouth daily.     guaiFENesin 600 MG 12 hr tablet  Commonly known as:  MUCINEX  Take 600 mg by mouth 2 (two) times daily as needed for congestion. For congestion.     ipratropium 0.03 % nasal spray  Commonly known as:  ATROVENT  Place 2 sprays into the nose 2 (two) times daily as needed for rhinitis.     levothyroxine 25 MCG tablet  Commonly known as:  SYNTHROID, LEVOTHROID  Take 1 tablet (25 mcg total) by mouth daily.     loratadine 10 MG tablet  Commonly known as:  CLARITIN  Take 10 mg by mouth daily.     metoprolol succinate 25 MG 24 hr tablet  Commonly known as:  TOPROL-XL  Take 1 tablet (25 mg total) by mouth daily.     mometasone 50 MCG/ACT nasal spray  Commonly known as:  NASONEX  Place 2 sprays into the nose daily as needed (for allergies).     multivitamin per tablet  Take 1 tablet by mouth daily.     OVER THE COUNTER MEDICATION  Place 1 spray into both nostrils daily as needed (for allergies). neilmed nasal wash     warfarin 5 MG tablet  Commonly known as:  COUMADIN  Take 5-7.5 mg by mouth daily. 5 mg daily except 7.5 mg on Monday and Friday         Disposition: home (assisted living)  Patient's condition: is Good  Follow up: 1. Dr. Myra Gianotti in 4 weeks with CTA 2. INR to be drawn at [facility]  02/09/13 and called to Dr. Patty Sermons 3. F/u with Dr. Patty Sermons in one week with BMP, INR, and CBC  Doreatha Massed, PA-C [facility] [phone] 02/05/2013  7:48 AM   - For VQI Registry use --- Instructions: Press F2 to tab through selections.  Delete question if not applicable.   Post-op:  Time to Extubation: [x ] In OR, [ ]  < 12 hrs, [ ]  12-24 hrs, [ ]  >=24 hrs Vasopressors Req. Post-op: No MI: no, [ ]  Troponin only, [ ]  EKG or Clinical New Arrhythmia: No CHF: No ICU Stay: 1 day in stepdown Transfusion: No  If yes, n/a units given  Complications: Resp failure: no, [ ]  Pneumonia, [ ]  Ventilator Chg in renal function: yes, [x ] Inc. Cr > 0.5-close to baseline at discharge, [ ]  Temp. Dialysis, [ ]  Permanent dialysis Leg ischemia: no, no Surgery needed, [ ]  Yes, Surgery needed, [ ]  Amputation Bowel ischemia: no, [ ]  Medical Rx, [ ]  Surgical Rx Wound complication: no, [ ]  Superficial separation/infection, [ ]  Return to OR Return to OR: No  Return to OR for bleeding: No Stroke: no, [ ]  Minor, [ ]  Major  Discharge medications: Statin use:  No If No: [x ] For Medical reasons, [ ]  Non-compliant, [ ]  Not-indicated ASA use:  Yes  If No: [ ]  For Medical reasons, [ ]  Non-compliant, [ ]  Not-indicated Plavix use:  No (on coumadin) If No: [ ]  For Medical reasons, [ ]  Non-compliant, [ ]  Not-indicated Beta blocker use:  Yes If No: [ ]  For Medical reasons, [ ]  Non-compliant, [ ]  Not-indicated

## 2013-02-06 LAB — BASIC METABOLIC PANEL
BUN: 30 mg/dL — ABNORMAL HIGH (ref 6–23)
Creatinine, Ser: 2.16 mg/dL — ABNORMAL HIGH (ref 0.50–1.35)
GFR calc Af Amer: 30 mL/min — ABNORMAL LOW (ref >90)
GFR calc non Af Amer: 26 mL/min — ABNORMAL LOW (ref >90)

## 2013-02-06 LAB — PROTIME-INR: Prothrombin Time: 25.9 seconds — ABNORMAL HIGH (ref 11.6–15.2)

## 2013-02-06 LAB — CBC
MCH: 30 pg (ref 26.0–34.0)
MCHC: 33.7 g/dL (ref 30.0–36.0)
Platelets: 113 10*3/uL — ABNORMAL LOW (ref 150–400)

## 2013-02-06 MED ORDER — FUROSEMIDE 20 MG PO TABS: 20.0000 mg | ORAL_TABLET | Freq: Every day | ORAL | Status: DC

## 2013-02-06 MED ORDER — METOPROLOL SUCCINATE ER 25 MG PO TB24: 25.0000 mg | ORAL_TABLET | Freq: Every day | ORAL | Status: DC

## 2013-02-06 MED ORDER — FUROSEMIDE 20 MG PO TABS
20.0000 mg | ORAL_TABLET | Freq: Every day | ORAL | Status: DC
  Administered 2013-02-06: 20 mg via ORAL
  Filled 2013-02-06: qty 1

## 2013-02-06 MED ORDER — WARFARIN SODIUM 7.5 MG PO TABS
7.5000 mg | ORAL_TABLET | Freq: Once | ORAL | Status: DC
  Filled 2013-02-06: qty 1

## 2013-02-06 MED ORDER — METOPROLOL SUCCINATE ER 25 MG PO TB24
25.0000 mg | ORAL_TABLET | Freq: Every day | ORAL | Status: DC
  Administered 2013-02-06: 25 mg via ORAL
  Filled 2013-02-06: qty 1

## 2013-02-06 NOTE — Progress Notes (Signed)
ANTICOAGULATION CONSULT NOTE - Follow Up Consult  Pharmacy Consult for Coumadin Indication: bioprosthetic AVR/afib  Allergies  Allergen Reactions  . Dust Mite Extract   . Lipitor [Atorvastatin Calcium] Other (See Comments)    Nervousness     . Ramipril Other (See Comments)    Pt cannot remember reaction   . Zocor [Simvastatin] Other (See Comments)    Nervousness   . Lovenox [Enoxaparin Sodium] Rash    Rash on abdomen, back, lower legs.     Patient Measurements: Height: 5\' 10"  (177.8 cm) Weight: 179 lb 3.2 oz (81.285 kg) IBW/kg (Calculated) : 73  Vital Signs: Temp: 98.3 F (36.8 C) (09/05 0318) Temp src: Oral (09/05 0318) BP: 142/56 mmHg (09/05 0318) Pulse Rate: 84 (09/05 0318)  Labs:  Recent Labs  02/04/13 0600 02/04/13 1604 02/05/13 0300 02/06/13 0650  HGB 9.8*  --  8.9* 9.0*  HCT 29.0*  --  26.1* 26.7*  PLT 138*  --  124* 113*  LABPROT 21.3*  --  25.6* 25.9*  INR 1.91*  --  2.43* 2.47*  HEPARINUNFRC 0.24* 0.18* 0.36  --   CREATININE 2.12*  --   --  2.16*    Estimated Creatinine Clearance: 24.4 ml/min (by C-G formula based on Cr of 2.16).   Assessment: 89yom s/p endovascular repair of AAA on Coumadin for his AVR/Afib. INR therapeutic this morning.  No bleeding or complications except left groin hematoma stable with some ecchymosis per [facility] note.  Home Coumadin regimen: 5 mg daily except 7.5 mg on Mondays & Fridays.  Goal of Therapy:  INR 2-3 Monitor platelets by anticoagulation protocol: Yes   Plan:  1. Coumadin 7.5 mg po x 1 tonight (consistent with home dosage). 2. INR in AM if he doesn't discharge today.  Tad Moore, BCPS  Clinical Pharmacist Pager [phone]  02/06/2013 8:39 AM

## 2013-02-06 NOTE — Progress Notes (Signed)
Subjective:  Overall doing well. Tolerating ambulation.  Mild dyspnea. No chest pain. Objective:  Vital Signs in the last 24 hours: Temp:  [97.6 F (36.4 C)-98.5 F (36.9 C)] 98.3 F (36.8 C) (09/05 0318) Pulse Rate:  [79-100] 84 (09/05 0318) Resp:  [18] 18 (09/05 0318) BP: (113-156)/(56-90) 142/56 mmHg (09/05 0318) SpO2:  [95 %-100 %] 95 % (09/05 0318) Weight:  [179 lb 3.2 oz (81.285 kg)-180 lb (81.647 kg)] 179 lb 3.2 oz (81.285 kg) (09/05 0318)  Intake/Output from previous day: 09/04 0701 - 09/05 0700 In: 720 [P.O.:720] Out: 1351 [Urine:1350; Stool:1] Intake/Output from this shift:    . aspirin EC  81 mg Oral Q M,W,F  . azelastine  1 spray Each Nare BID  . cloNIDine  0.2 mg Oral BID  . colesevelam  1,250 mg Oral BID WC  . docusate sodium  100 mg Oral Daily  . fluticasone  1 spray Each Nare Daily  . furosemide  20 mg Oral Daily  . levothyroxine  25 mcg Oral QAC breakfast  . loratadine  10 mg Oral Daily  . metoprolol succinate  25 mg Oral Daily  . mineral oil  1 enema Rectal Once  . multivitamin with minerals  1 tablet Oral Daily  . pantoprazole  40 mg Oral Daily  . tiotropium  18 mcg Inhalation Daily  . Warfarin - Pharmacist Dosing Inpatient   Does not apply q1800      Physical Exam: The patient appears to be in no distress.  Head and neck exam reveals that the pupils are equal and reactive.  The extraocular movements are full.  There is no scleral icterus.  Mouth and pharynx are benign.  No lymphadenopathy.  No carotid bruits.  The jugular venous pressure is normal.  Thyroid is not enlarged or tender.  Chest clear.  Heart reveals good prosthetic heart tones.  The abdomen is distended. Bowel sounds are good,.  Extremities reveal no phlebitis. Trace edema.  Pedal pulses are good.  There is no cyanosis or clubbing.  Neurologic exam is normal strength and no lateralizing weakness.  No sensory deficits.  Integument reveals no rash  Lab Results:  Recent  Labs  02/05/13 0300 02/06/13 0650  WBC 7.9 8.0  HGB 8.9* 9.0*  PLT 124* PENDING    Recent Labs  02/04/13 0600  NA 131*  K 4.6  CL 97  CO2 23  GLUCOSE 114*  BUN 35*  CREATININE 2.12*   No results found for this basename: TROPONINI, CK, MB,  in the last 72 hours Hepatic Function Panel No results found for this basename: PROT, ALBUMIN, AST, ALT, ALKPHOS, BILITOT, BILIDIR, IBILI,  in the last 72 hours No results found for this basename: CHOL,  in the last 72 hours No results found for this basename: PROTIME,  in the last 72 hours  Imaging: No results found.  Cardiac Studies: Telemetry atrial fib with rapid VR with activity. Assessment/Plan:  S/p endovascular repair - per vascular surgery.  Atrial fibrillation  Atrial fibrillation is permanent; Status post aortic valve replacement with bioprosthetic valve  Chronic anticoagulation INR now therapeutic. Acute on chronic renal failure improving. Acute diastolic CHF - patient mildly volume overloaded Hyponatremia - improved  Plan: Will increase Toprol to 25 mg daily. Will give small dose lasix 20 mg daily for next 5 days. Okay for discharge today. followup with me in about a week. Recheck CBC and BMET at OV. warfarin dosing drawn at [facility] with results sent to [facility]  coumadin clinic.   LOS: 11 days    Cassell Clement 02/06/2013, 7:41 AM

## 2013-02-06 NOTE — Progress Notes (Signed)
[facility] Progress Note  02/06/2013 7:19 AM 8 Days Post-Op  Subjective:  "My legs are swollen this morning".  "I'm ready to get out of this hospital"  Afebrile x 24 hrs HR 80's-100's 140's-150's systolic 95% RA  Filed Vitals:   02/06/13 0318  BP: 142/56  Pulse: 84  Temp: 98.3 F (36.8 C)  Resp: 18    Physical Exam: Incisions:  Left groin hematoma is stable with some ecchymosis (improving); right groin soft with ecchymosis Extremities:  Bilateral lower extremities with some edema.  Bilateral feet are warm and well perfused. Abdomen:  Soft NT/ND  CBC    Component Value Date/Time   WBC 7.9 02/05/2013 0300   RBC 2.95* 02/05/2013 0300   HGB 8.9* 02/05/2013 0300   HCT 26.1* 02/05/2013 0300   PLT 124* 02/05/2013 0300   MCV 88.5 02/05/2013 0300   MCH 30.2 02/05/2013 0300   MCHC 34.1 02/05/2013 0300   RDW 14.3 02/05/2013 0300   LYMPHSABS 1.0 01/15/2012 1632   MONOABS 0.9 01/15/2012 1632   EOSABS 0.6 01/15/2012 1632   BASOSABS 0.0 01/15/2012 1632    BMET    Component Value Date/Time   NA 131* 02/04/2013 0600   K 4.6 02/04/2013 0600   CL 97 02/04/2013 0600   CO2 23 02/04/2013 0600   GLUCOSE 114* 02/04/2013 0600   BUN 35* 02/04/2013 0600   CREATININE 2.12* 02/04/2013 0600   CREATININE 1.60* 12/29/2012 0821   CALCIUM 8.9 02/04/2013 0600   GFRNONAA 26* 02/04/2013 0600   GFRAA 30* 02/04/2013 0600    INR    Component Value Date/Time   INR 2.43* 02/05/2013 0300   INR 1.7 01/19/2013 1355     Intake/Output Summary (Last 24 hours) at 02/06/13 0719 Last data filed at 02/06/13 0647  Gross per 24 hour  Intake    720 ml  Output   1351 ml  Net   -631 ml     Assessment/Plan:  79 y.o. 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  They draw his blood work there and call the results to Dr. Patty Sermons.  If discharged today, will have them draw on Monday and call results to Dr. Patty Sermons.   Doreatha Massed, PA-C [facility] [phone] 02/06/2013 7:19 AM    \

## 2013-02-06 NOTE — Progress Notes (Signed)
Discharged to home with family office visits in place teaching done  

## 2013-02-08 NOTE — Discharge Summary (Signed)
I agree with the above.  I was out of town for his extended p[ost operative stay secondary to heparinization.  He was followed by Dr. Arbie Cookey.  No major post-operative issues.  He will follow up in 4 weeks with  A CTA  Durene Cal

## 2013-02-09 ENCOUNTER — Telehealth: Payer: Self-pay | Admitting: Surgery

## 2013-02-09 ENCOUNTER — Telehealth: Payer: Self-pay

## 2013-02-09 MED ORDER — FUROSEMIDE 40 MG PO TABS: 40.0000 mg | ORAL_TABLET | Freq: Every day | ORAL | Status: DC

## 2013-02-09 NOTE — Telephone Encounter (Signed)
Has been having swelling from feet to lower abdomen, has been this way since after surgery 01/29/13. States swelling is severe. Weight is up to 180-184 (was 170). Has not been having regular BM's, recommended Mirilax. Denies any pain from surgery. Current dose of Lasix 20 mg. Does have a call into the surgeon, will await call back from surgeon. Will forward to  Dr. Patty Sermons for review

## 2013-02-09 NOTE — Telephone Encounter (Signed)
Discussed with  Dr. Patty Sermons and will have patient increase lasix to 40 mg daily, new Rx sent to pharmacy. Advised patient

## 2013-02-09 NOTE — Telephone Encounter (Addendum)
Message copied by Fredrich Birks on Mon Feb 09, 2013 11:35 AM ------      Message from: Fredrich Birks      Created: Tue Feb 03, 2013  8:15 AM      Regarding: FW: schedule                   ----- Message -----         From: Sharee Pimple, CMA         Sent: 01/29/2013   2:42 PM           To: [facility] Admin Pool      Subject: schedule                                                             ----- Message -----         From: Dara Lords, PA-C         Sent: 01/29/2013   1:24 PM           To: Sharee Pimple, CMA            S/p [facility] 01/29/13.  F/u with Dr. Myra Gianotti in 4 weeks with CTA protocol.            Thanks,      Lelon Mast ------  Sent information via mail due to inability to reach patient, dpm

## 2013-02-09 NOTE — Telephone Encounter (Signed)
Phone call from pt. C/o swelling in bilateral feet and legs up to groin.  Reports has left groin tenderness with slight redness and swelling; denies drainage.  States the stitches in right groin are intact, and denies signs of redness or oozing.  Denies fever/ chills.  Reports was given instructions to take Lasix 20 mg., daily, x 5 days, and has taken 3 out of 5 doses, "but the swelling in legs/ feet is not going down."  Spoke with pt. following his phone call with nurse from cardiology.   Stated he reported to them he has "shortness of breath, and intermittent coughing-up light red blood." Was advised per Cardiology/ Dr. Yevonne Pax office, to increase Lasix to 40 mg qd.  Informed pt. will report symptoms to Dr. Myra Gianotti.

## 2013-02-09 NOTE — Telephone Encounter (Signed)
New problem     Has some questions regarding his lasix .

## 2013-02-09 NOTE — Telephone Encounter (Signed)
Informed Dr. Myra Gianotti of pt's complaints and of pt's reported instructions from cardiologist re: increase in Lasix.  No new orders/ recommendations per Dr. Myra Gianotti at this time; left voice message for patient.

## 2013-02-11 ENCOUNTER — Ambulatory Visit (INDEPENDENT_AMBULATORY_CARE_PROVIDER_SITE_OTHER): Payer: Medicare Other | Admitting: Cardiology

## 2013-02-11 ENCOUNTER — Ambulatory Visit (INDEPENDENT_AMBULATORY_CARE_PROVIDER_SITE_OTHER): Payer: Medicare Other | Admitting: *Deleted

## 2013-02-11 ENCOUNTER — Other Ambulatory Visit: Payer: Medicare Other

## 2013-02-11 ENCOUNTER — Encounter: Payer: Self-pay | Admitting: Cardiology

## 2013-02-11 VITALS — BP 212/90 | HR 88 | Ht 70.0 in | Wt 178.0 lb

## 2013-02-11 DIAGNOSIS — R609 Edema, unspecified: Secondary | ICD-10-CM

## 2013-02-11 DIAGNOSIS — I359 Nonrheumatic aortic valve disorder, unspecified: Secondary | ICD-10-CM

## 2013-02-11 DIAGNOSIS — I4891 Unspecified atrial fibrillation: Secondary | ICD-10-CM

## 2013-02-11 DIAGNOSIS — K5900 Constipation, unspecified: Secondary | ICD-10-CM

## 2013-02-11 DIAGNOSIS — Z952 Presence of prosthetic heart valve: Secondary | ICD-10-CM

## 2013-02-11 DIAGNOSIS — Z953 Presence of xenogenic heart valve: Secondary | ICD-10-CM

## 2013-02-11 DIAGNOSIS — R0602 Shortness of breath: Secondary | ICD-10-CM

## 2013-02-11 LAB — CBC WITH DIFFERENTIAL/PLATELET
Basophils Absolute: 0.1 10*3/uL (ref 0.0–0.1)
Eosinophils Absolute: 1.3 10*3/uL — ABNORMAL HIGH (ref 0.0–0.7)
HCT: 30 % — ABNORMAL LOW (ref 39.0–52.0)
Lymphocytes Relative: 8.1 % — ABNORMAL LOW (ref 12.0–46.0)
Lymphs Abs: 0.8 10*3/uL (ref 0.7–4.0)
Monocytes Relative: 12.5 % — ABNORMAL HIGH (ref 3.0–12.0)
Platelets: 225 10*3/uL (ref 150.0–400.0)
RDW: 15.3 % — ABNORMAL HIGH (ref 11.5–14.6)

## 2013-02-11 LAB — HEPATIC FUNCTION PANEL
Alkaline Phosphatase: 66 U/L (ref 39–117)
Bilirubin, Direct: 0.3 mg/dL (ref 0.0–0.3)
Total Bilirubin: 1.1 mg/dL (ref 0.3–1.2)
Total Protein: 7.5 g/dL (ref 6.0–8.3)

## 2013-02-11 LAB — BASIC METABOLIC PANEL
CO2: 26 mEq/L (ref 19–32)
Calcium: 9.1 mg/dL (ref 8.4–10.5)
Sodium: 130 mEq/L — ABNORMAL LOW (ref 135–145)

## 2013-02-11 MED ORDER — METOLAZONE 2.5 MG PO TABS: ORAL_TABLET | ORAL | Status: DC

## 2013-02-11 NOTE — Assessment & Plan Note (Signed)
The patient has had severe peripheral edema.  His Lasix has been increased since going home from the hospital he is presently taking 40 mg a day.  He is diuresing and his weight has come down from 186 07/05/1976.  He still has severe lower extremity pitting edema up to the groin bilaterally as well as scrotal and penile edema.  We will increase his Lasix to 80 mg daily and add Zaroxolyn 2.5 mg Monday Wednesday and Friday.  We are checking lab work today including CBC, pro BNP, basal metabolic panel, and hepatic function panel.

## 2013-02-11 NOTE — Assessment & Plan Note (Signed)
The patient has been expressing postoperative complications of constipation.  We will put him on a stool softener twice a day and he may use of MiraLax 17 g daily when necessary

## 2013-02-11 NOTE — Patient Instructions (Signed)
Will obtain labs today and call you with the results (cbc/bnp/bmet/hfp)  INCREASE YOUR LASIX (FUROSEMIDE) TO 80 MG DAILY  START ZAROXOLYN 2.5 MG ON Monday, Wednesday, AND Friday ONLY  ADD OTC STOOL SOFTENER 2 DAILY   USE MIRALAX AS NEEDED  Your physician recommends that you schedule a follow-up appointment in: 2 WEEK OV/BMET

## 2013-02-11 NOTE — Progress Notes (Signed)
Quick Note:  Please report to patient. The recent labs are stable. Continue same medication and careful diet. ______ 

## 2013-02-11 NOTE — Assessment & Plan Note (Signed)
Examination reveals good aortic closure sound and no murmur of aortic insufficiency

## 2013-02-11 NOTE — Assessment & Plan Note (Signed)
The patient remains in atrial fibrillation with controlled ventricular response.  We will be checking his INR today.

## 2013-02-11 NOTE — Progress Notes (Signed)
Evan Perez Date of Birth:  June 05, 1922 [facility] [address] [place], [place]  [address] [phone]         Fax   [phone]  History of Present Illness: This pleasant 79 year old gentleman who is a resident of [facility] is seen for a scheduled followup visit. He has a past history of previous severe aortic stenosis. An 04/12/10 he underwent aortic valve replacement with a bovine pericardial tissue valve by Dr. Laneta Simmers. His cardiac catheterization preoperatively showed only minimal coronary artery disease and he did not require coronary artery bypass graft surgery. Postoperatively the patient had a lot of problems with fluid retention and congestive heart failure which gradually resolved.  His last echocardiogram 06/20/10 showed mild left ventricular hypertrophy with ejection fraction 50-55% and the bioprosthetic aortic valve was functioning normally. There was biatrial enlargement and there was mild to moderate tricuspid regurgitation. 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  Because of renal insufficiency his losartan was stopped while in the hospital.  Since coming home from the hospital the patient has continued to have a lot of edema.  She also has orthopnea and a dry cough.  His TriCor is worse when he tries to lie down.  He is in the habit of sleeping in a recliner.    Current Outpatient Prescriptions  Medication Sig Dispense Refill  . aspirin 81 MG tablet Take 81 mg by mouth every Monday, Wednesday, and Friday.       Marland Kitchen azelastine (ASTELIN) 137 MCG/SPRAY nasal spray Place 1 spray into the nose 2 (two) times daily. Use in each nostril as directed      . betamethasone valerate (VALISONE) 0.1 % cream Apply 1 application topically  daily as needed (rash).      . cloNIDine (CATAPRES) 0.1 MG tablet Take 2 tablets (0.2 mg total) by mouth 2 (two) times daily.  120 tablet  5  . colesevelam (WELCHOL) 625 MG tablet Take 1,250 mg by mouth 2 (two) times daily with a meal.       . docusate sodium (COLACE) 100 MG capsule Take 100 mg by mouth 2 (two) times daily.      . furosemide (LASIX) 40 MG tablet Take 40 mg by mouth as directed. 2 DAILY      . guaiFENesin (MUCINEX) 600 MG 12 hr tablet Take 600 mg by mouth 2 (two) times daily as needed for congestion. For congestion.      Marland Kitchen levothyroxine (SYNTHROID, LEVOTHROID) 25 MCG tablet Take 1 tablet (25 mcg total) by mouth daily.  30 tablet  11  . loratadine (CLARITIN) 10 MG tablet Take 10 mg by mouth daily.      . metoprolol succinate (TOPROL-XL) 25 MG 24 hr tablet Take 1 tablet (25 mg total) by mouth daily.  30 tablet  1  . mometasone (NASONEX) 50 MCG/ACT nasal spray Place 2 sprays into the nose daily as needed (for allergies).       . multivitamin (THERAGRAN) per tablet Take 1 tablet by mouth daily.       Marland Kitchen OVER THE COUNTER MEDICATION Place 1 spray into both nostrils daily as needed (for allergies). neilmed nasal wash      . polyethylene glycol (MIRALAX / GLYCOLAX) packet Take 17 g by mouth as needed.      Marland Kitchen  warfarin (COUMADIN) 5 MG tablet Take 5-7.5 mg by mouth daily. 5 mg daily except 7.5 mg on Monday and Friday      . metolazone (ZAROXOLYN) 2.5 MG tablet 1 TABLET ON Monday, Wednesday, AND Friday ONLY  15 tablet  3   No current facility-administered medications for this visit.    Allergies  Allergen Reactions  . Dust Mite Extract   . Lipitor [Atorvastatin Calcium] Other (See Comments)    Nervousness     . Ramipril Other (See Comments)    Pt cannot remember reaction   . Zocor [Simvastatin] Other (See Comments)    Nervousness   . Lovenox [Enoxaparin Sodium] Rash    Rash on abdomen, back, lower legs.     Patient Active Problem List   Diagnosis Date Noted  . Status post aortic  valve replacement with bioprosthetic valve 01/15/2012    Priority: High  . Perennial allergic rhinitis 03/13/2011    Priority: High  . Drug-induced skin rash 09/12/2010    Priority: Medium  . HYPERTENSION 12/02/2006    Priority: Medium  . Atrial fibrillation 12/02/2006    Priority: Medium  . Acute on chronic renal failure 02/01/2013  . Chronic anticoagulation 01/26/2013  . Occlusion and stenosis of carotid artery without mention of cerebral infarction 12/29/2012  . Abdominal aneurysm without mention of rupture 12/15/2012  . AORTIC STENOSIS, SEVERE 11/01/2009  . DYSPNEA 11/01/2009  . BACK PAIN 10/03/2009  . POLYMYALGIA RHEUMATICA 04/01/2009  . ERECTILE DYSFUNCTION, ORGANIC 10/26/2008  . URI 06/24/2008  . GOUT 03/10/2007  . HYPERLIPIDEMIA 12/02/2006  . BENIGN PROSTATIC HYPERTROPHY 12/02/2006    History  Smoking status  . Former Smoker -- 0.75 packs/day for 40 years  . Types: Cigarettes  Smokeless tobacco  . Never Used    Comment: 01/26/2013 "stopped smoking for good in ~ 1985"    History  Alcohol Use  . 1.2 oz/week  . 2 Glasses of wine per week    Family History  Problem Relation Age of Onset  . Hypertension Mother   . Heart disease Mother   . Heart attack Mother   . Hypertension Father   . Heart disease Father   . Heart disease Brother     Review of Systems: Constitutional: no fever chills diaphoresis or fatigue or change in weight.  Head and neck: no hearing loss, no epistaxis, no photophobia or visual disturbance. Respiratory: No cough, shortness of breath or wheezing. Cardiovascular: No chest pain peripheral edema, palpitations. Gastrointestinal: No abdominal distention, no abdominal pain, no change in bowel habits hematochezia or melena. Genitourinary: No dysuria, no frequency, no urgency, no nocturia. Musculoskeletal:No arthralgias, no back pain, no gait disturbance or myalgias. Neurological: No dizziness, no headaches, no numbness, no seizures, no  syncope, no weakness, no tremors. Hematologic: No lymphadenopathy, no easy bruising. Psychiatric: No confusion, no hallucinations, no sleep disturbance.    Physical Exam: Filed Vitals:   02/11/13 0946  BP: 212/90  Pulse: 88   the general appearance reveals a well-developed elderly gentleman in no acute distress.  We rechecked his blood pressure and it had come down to 180/84.The head and neck exam reveals pupils equal and reactive.  Extraocular movements are full.  There is no scleral icterus.  The mouth and pharynx are normal.  The neck is supple.  The carotids reveal no bruits.  The jugular venous pressure is normal.  The  thyroid is not enlarged.  There is no lymphadenopathy.  The chest is clear to percussion and auscultation.  There  are no rales or rhonchi.  Expansion of the chest is symmetrical.  The precordium is quiet.  The first heart sound is normal.  The second heart sound is physiologically split.  There is no murmur gallop rub or click.  There is no abnormal lift or heave.  The abdomen is soft and nontender.  The bowel sounds are normal.  The liver and spleen are not enlarged.  There are no abdominal masses.  There are no abdominal bruits.  Extremities reveal 4+ pitting edema up to the groin bilaterally .the surgical incisions do not appear to be infected .There is no cyanosis or clubbing.  Strength is normal and symmetrical in all extremities.  There is no lateralizing weakness.  There are no sensory deficits.  The skin is warm and dry.  There is erythematous changes in his lower extremities bilaterally secondary to severe edema and stretching of the skin.  He does not appear to have many active cellulitis.     Assessment / Plan: Continue same medication except increase furosemide to 80 mg daily and at Zaroxolyn 2.5 mg on Monday Wednesday and Friday.  Checking lab work today.  Recheck in 2 weeks for office visit and basal metabolic panel.  Consider restarting losartan if and when his  renal function stabilizes or improves.

## 2013-02-13 ENCOUNTER — Telehealth: Payer: Self-pay | Admitting: *Deleted

## 2013-02-13 ENCOUNTER — Telehealth: Payer: Self-pay

## 2013-02-13 NOTE — Telephone Encounter (Signed)
Left message to continue same dose of medications, call back if any questions

## 2013-02-13 NOTE — Telephone Encounter (Signed)
Message copied by Burnell Blanks on Fri Feb 13, 2013 11:42 AM ------      Message from: Cassell Clement      Created: Wed Feb 11, 2013  9:58 PM       Please report to patient.  The recent labs are stable. Continue same medication and careful diet. ------

## 2013-02-13 NOTE — Telephone Encounter (Signed)
Pt. called to report concern of right groin area; reports has had spotting of blood from site where sutures are in place on 2 separate occasions.  Stated that Dr. Edilia Bo placed the sutures in the right groin on 8/30, when he had "some oozing of blood, 2 days after procedure."  Reports the sutures are still in place.  C/o some swelling and redness in the right groin.  Denies fever.  States "I get chilled sometimes."  Reports that the lower extremity swelling has improved in the thighs and genitals, but continues to have swelling bilateral lower legs, up to knees.  Stated saw Dr. Patty Sermons on Wed. and he adjusted some medication.  Pt's. 1 month post-op appt. And CTA abd/pelvis is sched. 10/6.  Offered pt. Appt. 9/15 @ 8:30 AM, for right groin check and suture removal.  Pt. States he will try to arrange transportation for 9/15 @ 8:30 AM

## 2013-02-16 ENCOUNTER — Ambulatory Visit (INDEPENDENT_AMBULATORY_CARE_PROVIDER_SITE_OTHER): Payer: Self-pay | Admitting: Surgery

## 2013-02-16 ENCOUNTER — Telehealth: Payer: Self-pay | Admitting: Cardiology

## 2013-02-16 ENCOUNTER — Encounter: Payer: Self-pay | Admitting: Surgery

## 2013-02-16 ENCOUNTER — Ambulatory Visit: Payer: Medicare Other | Admitting: Surgery

## 2013-02-16 VITALS — BP 130/99 | HR 106 | Resp 18 | Ht 70.0 in | Wt 171.8 lb

## 2013-02-16 DIAGNOSIS — Z48812 Encounter for surgical aftercare following surgery on the circulatory system: Secondary | ICD-10-CM

## 2013-02-16 DIAGNOSIS — I714 Abdominal aortic aneurysm, without rupture: Secondary | ICD-10-CM

## 2013-02-16 NOTE — Telephone Encounter (Signed)
Metolavone is making me sick.  Tried to take med on Wed and Friday.   Has stop taking the med.

## 2013-02-16 NOTE — Telephone Encounter (Signed)
Left message to call back  

## 2013-02-16 NOTE — Progress Notes (Signed)
The patient comes in today for followup. On 01/06/2013 he underwent coil embolization of the left hypogastric artery in anticipation of endovascular repair of an abdominal and iliac aneurysm. He is chronically maintained on Coumadin. He was bridged with Lovenox for his arteriogram however he developed a total body rash secondary to Lovenox. I gave him several weeks to recover from this and then we scheduled his aneurysm repair. This was done on 01/29/2013. The patient was brought in preoperatively for heparinized sedation while his Coumadin levels declined. Postoperatively he was again maintained on heparin until he became therapeutic on his Coumadin. He did have complaints of leg swelling was initially involved the scrotum, thighs, and calves. With the assistance of Dr. Yevonne Pax management, swelling in the scrotum and thigh have significantly decreased. He still complains of pain and swelling in his calves. He is taking Lasix which does keep him most of the night. He denies any abdominal pain.  On examination her groin incisions are healing nicely. There was a nylon suture in the right groin which I removed today. There was a mild amount of oozing. There is a raised area in the left groin without a thrill or pulse.  Overall, I think the patient is doing very well, being 79 years old and had recently undergone aneurysm repair. He is scheduled to come back to see me in early October with a CT scan to evaluate his repair. I have assured him that he will continue to improve with time.

## 2013-02-17 ENCOUNTER — Telehealth: Payer: Self-pay | Admitting: Cardiology

## 2013-02-17 NOTE — Telephone Encounter (Signed)
Pt advised.

## 2013-02-17 NOTE — Telephone Encounter (Signed)
Stop metolazone and mark chart as allergic. Continue lasix 80 mg daily.

## 2013-02-17 NOTE — Telephone Encounter (Signed)
Pt was prescribed metolazone 2.5mg  M-W-F and increase in  lasix to 80mg  daily on 02/11/13. Pt states he was only able to take 2 doses of metolazone because he had significant nausea almost immediately after taking it. He states his edema from his waist down is improving daily and his thighs are normal. He does not weigh daily and denies increase in SOB. He is taking lasix 80mg  daily. I will forward to Dr Patty Sermons for review.

## 2013-02-17 NOTE — Telephone Encounter (Signed)
Patient missed Melinda's call yesterday.  He had a question about a medication making him sick.  He did not want to wait to speak with Juliette Alcide when she comes back tomorrow.

## 2013-02-19 NOTE — Telephone Encounter (Signed)
Spoke with patient (he had spoken with Cytogeneticist last week), no weight gain and swelling better. Still have swelling in legs though. Taking Lasix 80 mg daily. Also still having problems with constipation, using Miralax and colace daily.  Will forward to  Dr. Patty Sermons for recommendations

## 2013-02-19 NOTE — Telephone Encounter (Signed)
Continue same dose of Lasix.  Continue stool softeners and MiraLax.  If constipation fails to improve, consult PCP

## 2013-02-19 NOTE — Telephone Encounter (Signed)
Advised patient

## 2013-02-25 ENCOUNTER — Ambulatory Visit (INDEPENDENT_AMBULATORY_CARE_PROVIDER_SITE_OTHER): Payer: Medicare Other | Admitting: Cardiology

## 2013-02-25 ENCOUNTER — Ambulatory Visit (INDEPENDENT_AMBULATORY_CARE_PROVIDER_SITE_OTHER): Payer: Medicare Other | Admitting: *Deleted

## 2013-02-25 ENCOUNTER — Encounter: Payer: Self-pay | Admitting: Cardiology

## 2013-02-25 VITALS — BP 130/70 | HR 67 | Ht 70.0 in | Wt 177.0 lb

## 2013-02-25 DIAGNOSIS — Z79899 Other long term (current) drug therapy: Secondary | ICD-10-CM

## 2013-02-25 DIAGNOSIS — R609 Edema, unspecified: Secondary | ICD-10-CM

## 2013-02-25 DIAGNOSIS — I4891 Unspecified atrial fibrillation: Secondary | ICD-10-CM

## 2013-02-25 DIAGNOSIS — I10 Essential (primary) hypertension: Secondary | ICD-10-CM

## 2013-02-25 DIAGNOSIS — Z952 Presence of prosthetic heart valve: Secondary | ICD-10-CM

## 2013-02-25 DIAGNOSIS — K5900 Constipation, unspecified: Secondary | ICD-10-CM

## 2013-02-25 DIAGNOSIS — E7800 Pure hypercholesterolemia, unspecified: Secondary | ICD-10-CM

## 2013-02-25 DIAGNOSIS — Z953 Presence of xenogenic heart valve: Secondary | ICD-10-CM

## 2013-02-25 DIAGNOSIS — I359 Nonrheumatic aortic valve disorder, unspecified: Secondary | ICD-10-CM

## 2013-02-25 LAB — CBC WITH DIFFERENTIAL/PLATELET
Basophils Absolute: 0.1 10*3/uL (ref 0.0–0.1)
Eosinophils Relative: 9 % — ABNORMAL HIGH (ref 0.0–5.0)
HCT: 28.2 % — ABNORMAL LOW (ref 39.0–52.0)
Hemoglobin: 9.4 g/dL — ABNORMAL LOW (ref 13.0–17.0)
Lymphocytes Relative: 8.4 % — ABNORMAL LOW (ref 12.0–46.0)
Lymphs Abs: 0.6 10*3/uL — ABNORMAL LOW (ref 0.7–4.0)
Monocytes Relative: 11.2 % (ref 3.0–12.0)
Neutro Abs: 4.9 10*3/uL (ref 1.4–7.7)
WBC: 6.9 10*3/uL (ref 4.5–10.5)

## 2013-02-25 LAB — LIPID PANEL
Cholesterol: 127 mg/dL (ref 0–200)
HDL: 40.9 mg/dL (ref >39.00)
Triglycerides: 65 mg/dL (ref 0.0–149.0)
VLDL: 13 mg/dL (ref 0.0–40.0)

## 2013-02-25 LAB — HEPATIC FUNCTION PANEL
ALT: 23 U/L (ref 0–53)
AST: 33 U/L (ref 0–37)
Albumin: 3.5 g/dL (ref 3.5–5.2)
Total Bilirubin: 0.8 mg/dL (ref 0.3–1.2)
Total Protein: 7.3 g/dL (ref 6.0–8.3)

## 2013-02-25 LAB — POCT INR: INR: 2.6

## 2013-02-25 LAB — BASIC METABOLIC PANEL
Calcium: 8.8 mg/dL (ref 8.4–10.5)
GFR: 31.3 mL/min — ABNORMAL LOW (ref >60.00)
Potassium: 4.3 mEq/L (ref 3.5–5.1)
Sodium: 130 mEq/L — ABNORMAL LOW (ref 135–145)

## 2013-02-25 MED ORDER — FUROSEMIDE 40 MG PO TABS: 40.0000 mg | ORAL_TABLET | ORAL | Status: DC

## 2013-02-25 NOTE — Patient Instructions (Addendum)
Will obtain labs today and call you with the results (cbc/bmet)  STOP [facility]   Your physician wants you to follow-up in: 1 MONTH OV/LP/BMET/HFP/CBC AND EKG You will receive a reminder letter in the mail two months in advance. If you don't receive a letter, please call our office to schedule the follow-up appointment. Nothing to eat or drink that morning except water

## 2013-02-25 NOTE — Progress Notes (Signed)
Quick Note:  Please report to patient. The recent labs are stable. Continue same medication and careful diet. The lipids are very good so okay to stop the Welchol. The kidney function is stable so continue same dose of lasix. The Hgb has dropped slightly so add a multivitamin containing iron once a day. The liver tests are all normal. ______

## 2013-02-25 NOTE — Assessment & Plan Note (Signed)
Blood pressure is remaining stable on current medication.  His overall fluid retention has improved since last visit.

## 2013-02-25 NOTE — Assessment & Plan Note (Signed)
The patient is in permanent atrial fibrillation.  He is on long-term Coumadin.  He has not been aware of any hematochezia or melena.

## 2013-02-25 NOTE — Progress Notes (Signed)
Evan Perez Date of Birth:  [birth year] [facility] [address] [place], [place]  [address] [phone]         Fax   [phone]  History of Present Illness: This pleasant 79 year old gentleman who is a resident of [facility] is seen for a scheduled followup visit. He has a past history of previous severe aortic stenosis. An 04/12/10 he underwent aortic valve replacement with a bovine pericardial tissue valve by Dr. Laneta Simmers. His cardiac catheterization preoperatively showed only minimal coronary artery disease and he did not require coronary artery bypass graft surgery. Postoperatively the patient had a lot of problems with fluid retention and congestive heart failure which gradually resolved.  His last echocardiogram 06/20/10 showed mild left ventricular hypertrophy with ejection fraction 50-55% and the bioprosthetic aortic valve was functioning normally. There was biatrial enlargement and there was mild to moderate tricuspid regurgitation. 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  Because of renal insufficiency his losartan was stopped while in the hospital.  Since coming home from the hospital the patient has continued to have a lot of edema.  She also has orthopnea and a dry cough.  His weight is down 1 pound since last visit 2 weeks ago.  He was unable to tolerate Zaroxolyn because of nausea.  He has had significant abdominal distention and constipation.   Current Outpatient Prescriptions  Medication Sig Dispense Refill  . aspirin 81 MG tablet Take 81 mg by mouth every Monday, Wednesday, and Friday.       Marland Kitchen azelastine (ASTELIN) 137 MCG/SPRAY nasal spray Place 1 spray into the nose 2 (two) times daily. Use in each nostril as directed       . betamethasone valerate (VALISONE) 0.1 % cream Apply 1 application topically daily as needed (rash).      . cloNIDine (CATAPRES) 0.1 MG tablet Take 2 tablets (0.2 mg total) by mouth 2 (two) times daily.  120 tablet  5  . docusate sodium (COLACE) 100 MG capsule Take 100 mg by mouth 2 (two) times daily.      . furosemide (LASIX) 40 MG tablet Take 1 tablet (40 mg total) by mouth as directed. 2 DAILY  180 tablet  3  . guaiFENesin (MUCINEX) 600 MG 12 hr tablet Take 600 mg by mouth 2 (two) times daily as needed for congestion. For congestion.      Marland Kitchen levothyroxine (SYNTHROID, LEVOTHROID) 25 MCG tablet Take 1 tablet (25 mcg total) by mouth daily.  30 tablet  11  . loratadine (CLARITIN) 10 MG tablet Take 10 mg by mouth daily.      . metoprolol succinate (TOPROL-XL) 25 MG 24 hr tablet Take 1 tablet (25 mg total) by mouth daily.  30 tablet  1  . mometasone (NASONEX) 50 MCG/ACT nasal spray Place 2 sprays into the nose daily as needed (for allergies).       . multivitamin (THERAGRAN) per tablet Take 1 tablet by mouth daily.       Marland Kitchen OVER THE COUNTER MEDICATION Place 1 spray into both nostrils daily as needed (for allergies). neilmed nasal wash      . polyethylene glycol (MIRALAX / GLYCOLAX) packet Take 17 g by mouth as needed.      . warfarin (COUMADIN) 5 MG tablet Take 5-7.5  mg by mouth daily. 5 mg daily except 7.5 mg on Monday and Friday       No current facility-administered medications for this visit.    Allergies  Allergen Reactions  . Dust Mite Extract   . Lipitor [Atorvastatin Calcium] Other (See Comments)    Nervousness     . Metolazone Nausea Only  . Ramipril Other (See Comments)    Pt cannot remember reaction   . Zocor [Simvastatin] Other (See Comments)    Nervousness   . Lovenox [Enoxaparin Sodium] Rash    Rash on abdomen, back, lower legs.     Patient Active Problem List   Diagnosis Date Noted  . Status post aortic valve replacement with bioprosthetic valve 01/15/2012    Priority:  High  . Perennial allergic rhinitis 03/13/2011    Priority: High  . Drug-induced skin rash 09/12/2010    Priority: Medium  . HYPERTENSION 12/02/2006    Priority: Medium  . Atrial fibrillation 12/02/2006    Priority: Medium  . Aftercare following surgery of the circulatory system, NEC 02/16/2013  . Peripheral edema 02/11/2013  . Constipation 02/11/2013  . Acute on chronic renal failure 02/01/2013  . Chronic anticoagulation 01/26/2013  . Occlusion and stenosis of carotid artery without mention of cerebral infarction 12/29/2012  . Abdominal aneurysm without mention of rupture 12/15/2012  . AORTIC STENOSIS, SEVERE 11/01/2009  . DYSPNEA 11/01/2009  . BACK PAIN 10/03/2009  . POLYMYALGIA RHEUMATICA 04/01/2009  . ERECTILE DYSFUNCTION, ORGANIC 10/26/2008  . URI 06/24/2008  . GOUT 03/10/2007  . HYPERLIPIDEMIA 12/02/2006  . BENIGN PROSTATIC HYPERTROPHY 12/02/2006    History  Smoking status  . Former Smoker -- 0.75 packs/day for 40 years  . Types: Cigarettes  Smokeless tobacco  . Never Used    Comment: 01/26/2013 "stopped smoking for good in ~ 1985"    History  Alcohol Use  . 1.2 oz/week  . 2 Glasses of wine per week    Family History  Problem Relation Age of Onset  . Hypertension Mother   . Heart disease Mother   . Heart attack Mother   . Hypertension Father   . Heart disease Father   . Heart disease Brother     Review of Systems: Constitutional: no fever chills diaphoresis or fatigue or change in weight.  Head and neck: no hearing loss, no epistaxis, no photophobia or visual disturbance. Respiratory: No cough, shortness of breath or wheezing. Cardiovascular: No chest pain peripheral edema, palpitations. Gastrointestinal: No abdominal distention, no abdominal pain, no change in bowel habits hematochezia or melena. Genitourinary: No dysuria, no frequency, no urgency, no nocturia. Musculoskeletal:No arthralgias, no back pain, no gait disturbance or  myalgias. Neurological: No dizziness, no headaches, no numbness, no seizures, no syncope, no weakness, no tremors. Hematologic: No lymphadenopathy, no easy bruising. Psychiatric: No confusion, no hallucinations, no sleep disturbance.    Physical Exam: Filed Vitals:   02/25/13 0926  BP: 130/70  Pulse: 67   the general appearance reveals a well-developed elderly gentleman in no acute distress.  We rechecked his blood pressure and it had come down to 180/84.The head and neck exam reveals pupils equal and reactive.  Extraocular movements are full.  There is no scleral icterus.  The mouth and pharynx are normal.  The neck is supple.  The carotids reveal no bruits.  The jugular venous pressure is normal.  The  thyroid is not enlarged.  There is no lymphadenopathy.  The chest is clear to percussion and auscultation.  There are no  rales or rhonchi.  Expansion of the chest is symmetrical.  The precordium is quiet.  The first heart sound is normal.  The second heart sound is physiologically split.  There is no murmur gallop rub or click.  There is no abnormal lift or heave.  The abdomen is soft and nontender.  The bowel sounds are normal.  The liver and spleen are not enlarged.  There are no abdominal masses.  There are no abdominal bruits.  Extremities reveal 4+ pitting edema up to the groin bilaterally .the surgical incisions do not appear to be infected .There is no cyanosis or clubbing.  Strength is normal and symmetrical in all extremities.  There is no lateralizing weakness.  There are no sensory deficits.  The skin is warm and dry.  There is erythematous changes in his lower extremities bilaterally secondary to severe edema and stretching of the skin.  He does not appear to have many active cellulitis.     Assessment / Plan: Continue Lasix 80 mg daily.  Stop WelChol.  Check CBC and nasal metabolic panel today.  Some of his dyspnea is related to his anemia.  Overall he appears to be getting slightly  better.  Recheck in one month for office visit EKG and lab work.

## 2013-02-25 NOTE — Assessment & Plan Note (Signed)
The patient has been having significant constipation and abdominal distention despite taking MiraLax and stool softeners.  Evan Perez has been on long-term Welchol.  This may be contributing to his constipation.  We will stop his WelChol at this time and when Evan Perez returns next month we will check fasting lipids

## 2013-02-25 NOTE — Assessment & Plan Note (Signed)
The peripheral edema and the scrotal edema has improved.

## 2013-02-26 ENCOUNTER — Telehealth: Payer: Self-pay | Admitting: Cardiology

## 2013-02-26 NOTE — Telephone Encounter (Signed)
Advised patient of labs and will contact PCP regarding sleep since  Dr. Patty Sermons out of the office

## 2013-02-26 NOTE — Telephone Encounter (Signed)
New Problem:  Pt states he was seen in the clinic yesterday by Dr. Patty Sermons. Pt states he failed to mention he has been having issues sleeping. Pt would like to know if Dr. Patty Sermons can order him something to help him sleep better.

## 2013-02-26 NOTE — Telephone Encounter (Signed)
Message copied by Burnell Blanks on Thu Feb 26, 2013 11:45 AM ------      Message from: Cassell Clement      Created: Wed Feb 25, 2013  8:08 PM       Please report to patient.  The recent labs are stable. Continue same medication and careful diet.  The lipids are very good so okay to stop the Welchol. The kidney function is stable so continue same dose of lasix. The Hgb has dropped slightly so add a multivitamin containing iron once a day. The liver tests are all normal. ------

## 2013-03-02 ENCOUNTER — Telehealth: Payer: Self-pay | Admitting: Cardiology

## 2013-03-02 NOTE — Telephone Encounter (Signed)
New problem    What type of iron should patient take over the counter .     Not able to sleep at night

## 2013-03-02 NOTE — Telephone Encounter (Signed)
Spoke with patient and  Dr. Patty Sermons was recommending just multivit plus iron. Did explain again that he needed to contact PCP for sleeping medication

## 2013-03-06 ENCOUNTER — Other Ambulatory Visit: Payer: Self-pay | Admitting: *Deleted

## 2013-03-06 ENCOUNTER — Encounter: Payer: Self-pay | Admitting: Surgery

## 2013-03-06 ENCOUNTER — Telehealth: Payer: Self-pay | Admitting: Cardiology

## 2013-03-06 DIAGNOSIS — Z48812 Encounter for surgical aftercare following surgery on the circulatory system: Secondary | ICD-10-CM

## 2013-03-06 DIAGNOSIS — I714 Abdominal aortic aneurysm, without rupture: Secondary | ICD-10-CM

## 2013-03-06 NOTE — Telephone Encounter (Signed)
Follow Up:  Pt is calling w/ concerns about his BP meds. Pt states he has stopped taking Metoprolol. And pt would like to be advised on his BP meds

## 2013-03-06 NOTE — Telephone Encounter (Signed)
New problem    Side effect from metoprolol , itching , not sleeping.  For several week.

## 2013-03-06 NOTE — Telephone Encounter (Signed)
Left message to call back  

## 2013-03-09 ENCOUNTER — Ambulatory Visit (INDEPENDENT_AMBULATORY_CARE_PROVIDER_SITE_OTHER): Payer: Self-pay | Admitting: Surgery

## 2013-03-09 ENCOUNTER — Ambulatory Visit
Admission: RE | Admit: 2013-03-09 | Discharge: 2013-03-09 | Disposition: A | Discharge status: Home / Self Care | Payer: Medicare Other | Source: Ambulatory Visit | Attending: Surgery | Admitting: Surgery

## 2013-03-09 ENCOUNTER — Encounter: Payer: Self-pay | Admitting: Surgery

## 2013-03-09 VITALS — BP 184/98 | HR 110 | Temp 97.8°F | Resp 18 | Ht 70.0 in | Wt 171.0 lb

## 2013-03-09 DIAGNOSIS — I714 Abdominal aortic aneurysm, without rupture: Secondary | ICD-10-CM

## 2013-03-09 DIAGNOSIS — Z48812 Encounter for surgical aftercare following surgery on the circulatory system: Secondary | ICD-10-CM

## 2013-03-09 NOTE — Progress Notes (Signed)
The patient comes in today for followup. On 01/06/2013 he underwent coil embolization of the left hypogastric artery in anticipation of endovascular repair of an abdominal and iliac aneurysm. He is chronically maintained on Coumadin. He was bridged with Lovenox for his arteriogram however he developed a total body rash secondary to Lovenox. I gave him several weeks to recover from this and then we scheduled his aneurysm repair. This was done on 01/29/2013.  The patient complains of continued lower extremity edema. He also had issues with itching. His most distress sitting problem is the inability to sleep. He wakes up several times at night secondary to itching and having to go to the bathroom. He also complained of constipation.  On examination, he has 1-2+ pitting edema bilaterally with dependent rubor. This is slightly improved. Both groin incisions are healing nicely. His abdomen is soft and nontender.  The patient's CT scan today. This was without contrast because his creatinine was elevated. This showed the stent graft is in good position.  #1: constipation: The patient will continue taking MiraLAX and Colace.  #2: Renal insufficiency: I suspect this is secondary to dehydration given his increasing diuretics for his lower extremity edema. His baseline creatinine was 1.7. His BUN is elevated on his recent labs. Hopefully with improving his fluid status this will settle out. #3:  Aneurysm: The stent graft is in good position. I cannot determine whether or not an Endo leak is present because of the lack of contrast. I will follow this in 3 months with an ultrasound. #4: Insomnia: The patient feels that the metoprolol which was started during his hospital stay is responsible for his lack of sleep and itching. I have told him to stop this and get back in touch with Dr. Patty Sermons to find the appropriate replacement medication for hypertension. In addition I have told him to start taking Benadryl. Over  this will help him sleep and help with the itching.

## 2013-03-09 NOTE — Telephone Encounter (Signed)
Follow Up:  Pt states he is calling melinda back. PT states triage nurse can leave a voice mail.

## 2013-03-09 NOTE — Telephone Encounter (Signed)
I spoke with the patient. He states that he was started on metoprolol at the end of August. This is on his discharge list from his surgery in late august. He reports since then, that he has had SOB, joint pain, lightheadedness (he has not checked his BP), edema, itching at night to the chest, arms, and back. He also has a decreased appetite and occasional nausea. He has a cough at night. The patient states he has read the package insert on metoprolol and feels this is the cause of his symptoms. He stopped this medication about a week ago on his own. His symptoms have not resolved as of yet. The patient states he saw Dr. Myra Gianotti this morning and he agreed with stopping the metoprolol. The patient has not tried benadryl for continued itching that is causing him sleep disturbance. He did obtain some benadryl today to try. I advised I will forward to [facility] and Dr. Patty Sermons for review and recommendations. The patient is agreeable.

## 2013-03-09 NOTE — Telephone Encounter (Signed)
Follow Up:  Pt states he is concerned about his BP meds. Pt states he stopped taking his metoprolol and would like to be advised on his meds. Pt states he talked to melinda on Friday

## 2013-03-09 NOTE — Telephone Encounter (Signed)
LMTCB

## 2013-03-10 NOTE — Telephone Encounter (Signed)
Advised patient

## 2013-03-10 NOTE — Telephone Encounter (Signed)
Okay to stop metoprolol and observe response.

## 2013-03-10 NOTE — Addendum Note (Signed)
Addended by: Sharee Pimple on: 03/10/2013 09:11 AM   Modules accepted: Orders

## 2013-03-16 ENCOUNTER — Telehealth: Payer: Self-pay | Admitting: Internal Medicine

## 2013-03-16 NOTE — Telephone Encounter (Signed)
Patient Information:  Caller Name: [name]  Phone: [phone]  Patient: Evan Perez, Evan Perez  Gender: Male  DOB: 11/17/1922  Age: 79 Years  PCP: Eleonore Chiquito (Family Practice > 6yrs old)  Office Follow Up:  Does the office need to follow up with this patient?: No  Instructions For The Office: N/A  RN Note:  Patient states he is status post repair of repair of Abdominal Aortic Anuerysm 01/29/13. Patient states he has had difficulty sleeping since the surgery. Patient is requesting a Rx for sleep medication. Patient also states he has dyspnea with exertion. Onset X 6 weeks. States dyspnea relieves with rest. Patient also describes orthopnea. Patient states he sleeps flat. Denies any increase in extremity swelling. States extremity swelling has actually improved after starting Lasix. Patient states he has seen Dr Patty Sermons and Dr. Myra Gianotti and was advised to see Dr. Amador Cunas. Patient also states he developed rash and itching, onset X 6 weeks. Patient describes as tiny pinpoint dots under his skin. Patient states he is color blind and is unable to clarify the color of the dots. States areas are located on chest, arms, neck, abdomen. Care advice given per guidelines. Call back parameters reviewed. Patient verbalizes understanding. No appts. available for 03/16/13. Patient declines to go to the ED for evaluation. Patient requests appt. for 03/17/13.  Symptoms  Reason For Call & Symptoms: Difficulty sleeping  Reviewed Health History In EMR: Yes  Reviewed Medications In EMR: Yes  Reviewed Allergies In EMR: Yes  Reviewed Surgeries / Procedures: Yes  Date of Onset of Symptoms: Unknown  Guideline(s) Used:  Insomnia  Breathing Difficulty  Rash or Redness - Widespread  Disposition Per Guideline:   See Today in Office  Reason For Disposition Reached:   Severe itching  Advice Given:  Tips For Good Sleep:  Drink a small glass of warm milk at bedtime.  Take a warm bath or shower before  bedtime.  Try to go to bed at the same time each night. More importantly, get up at the same time each morning (don't sleep in).  Tips For Good Sleep - Your Bedroom:  Keep bedroom temperature cool, not warm or cold.  Keep bedroom quiet and dark.  Tips For Good Sleep - When You Can  If you can't fall asleep after 30 minutes, get out of bed and do something relaxing.  Read a book or listen to some soothing music.  When you feel sleepy, go back to bed.  Repeat these steps as needed.  General Care Advice for Breathing Difficulty:  Find position of greatest comfort. For most patients the best position is semi-upright (e.g., sitting up in a comfortable chair or lying back against pillows).  Elevate head of bed (e.g., use pillows or place blocks under bed).  Avoid smoke or fume exposure.  General Care Advice for Breathing Difficulty:  Find position of greatest comfort. For most patients the best position is semi-upright (e.g., sitting up in a comfortable chair or lying back against pillows).  Elevate head of bed (e.g., use pillows or place blocks under bed).  Avoid smoke or fume exposure.  Limit activities or space activities apart during the day. Prioritize activities.  Call Back If:  Severe difficulty breathing occurs  Fever more than 100.5 F (38.1 C)  You become worse.  Reassurance:  There are many causes of widespread rashes and most of the time they are not serious. Common causes include viral illness (e.g., cold viruses) and allergic reactions (to a food, medicine, or  environmental exposure).  Oatmeal Aveeno Bath for Itching:  Sprinkle contents of one Aveeno packet under running faucet with comfortably warm water. Bathe for 15 - 20 minutes, 1-2 times daily.  Pat dry with a towel. Do not rub the rash.  Call Back If:   You become worse  Patient Refused Recommendation:  Patient Refused Appt, Patient Requests Appt At Later Date  .

## 2013-03-16 NOTE — Telephone Encounter (Signed)
Addendum to prior noted: Appt. Scheduled for 03/17/13 1045 with Dr. Amador Cunas (per patient request). Patient advised to go to ED if sx increase. Call back parameters reviewed. Patient verbalizes understanding.

## 2013-03-17 ENCOUNTER — Encounter: Payer: Self-pay | Admitting: Internal Medicine

## 2013-03-17 ENCOUNTER — Ambulatory Visit (INDEPENDENT_AMBULATORY_CARE_PROVIDER_SITE_OTHER): Payer: Medicare Other | Admitting: Internal Medicine

## 2013-03-17 VITALS — BP 140/80 | HR 106 | Temp 97.9°F | Resp 20 | Wt 171.0 lb

## 2013-03-17 DIAGNOSIS — Z953 Presence of xenogenic heart valve: Secondary | ICD-10-CM

## 2013-03-17 DIAGNOSIS — Z7901 Long term (current) use of anticoagulants: Secondary | ICD-10-CM

## 2013-03-17 DIAGNOSIS — Z23 Encounter for immunization: Secondary | ICD-10-CM

## 2013-03-17 DIAGNOSIS — I4891 Unspecified atrial fibrillation: Secondary | ICD-10-CM

## 2013-03-17 DIAGNOSIS — R0602 Shortness of breath: Secondary | ICD-10-CM

## 2013-03-17 DIAGNOSIS — I10 Essential (primary) hypertension: Secondary | ICD-10-CM

## 2013-03-17 DIAGNOSIS — Z952 Presence of prosthetic heart valve: Secondary | ICD-10-CM

## 2013-03-17 MED ORDER — TRAZODONE 25 MG HALF TABLET: 50.0000 mg | ORAL_TABLET | Freq: Every evening | ORAL | Status: DC | PRN

## 2013-03-17 NOTE — Patient Instructions (Signed)
Limit your sodium (Salt) intake  Trazodone one half tablet at bedtime as discussed. Take Benadryl 25 mg also at bedtime

## 2013-03-17 NOTE — Progress Notes (Signed)
Subjective:    Patient ID: Evan Perez, male    DOB: 06-Aug-1922, 79 y.o.   MRN: [id number]  HPI  79 year old patient who is approximately 6 weeks status post endovascular repair of abdominal aortic aneurysm. He is status post aVR approximately 3 years ago and is on chronic Coumadin anticoagulation for atrial fibrillation.  Complaints include insomnia. He also has some shortness of breath. He is scheduled for cardiology followup later this week.  Apparently metoprolol has been discontinued due to a rash possible drug associated. He has been using Benadryl for the rash which has improved.  Past Medical History  Diagnosis Date  . History of aortic stenosis     Severe s/p bovine pericardial tissue AVR 04/2010  . Hypertension   . Fall 2011    FRACTURE VERTEBRAL  . CHF (congestive heart failure)     LV DYSFUNCTION -EF 55% JAN 2012  . Permanent atrial fibrillation     On chronic Coumadin anticoagulation  . Cancer   . CAD (coronary artery disease)     Nonobstructive by 2011 cath  . Hyperlipidemia   . Complication of anesthesia     "drowsy for 2 days after OR" (01/26/2013)  . AAA (abdominal aortic aneurysm)     AAA/left iliac aneurysm (3.9 cm)/notes 01/26/2013    History   Social History  . Marital Status: Widowed    Spouse Name: N/A    Number of Children: N/A  . Years of Education: N/A   Occupational History  . Not on file.   Social History Main Topics  . Smoking status: Former Smoker -- 0.75 packs/day for 40 years    Types: Cigarettes  . Smokeless tobacco: Never Used     Comment: 01/26/2013 "stopped smoking for good in ~ 1985"  . Alcohol Use: 1.2 oz/week    2 Glasses of wine per week  . Drug Use: No  . Sexual Activity: Not Currently   Other Topics Concern  . Not on file   Social History Narrative  . No narrative on file    Past Surgical History  Procedure Laterality Date  . Aortic valve replacement      NOV 2011-TISSUE VALVE, 23 mm Mitroflow aortic pericardial  heart valve  . Cardiac catheterization      03/2010-non obstructive CAD; severe AS  . Vasectomy    . Tonsillectomy    . Cardiac valve replacement    . Embolization      coil embolization of the left hypogastric artery prior to endovascular repair/notes 01/26/2013  . Cataract extraction w/ intraocular lens  implant, bilateral Bilateral     "right ~ 1999; left ~ 2012" (01/26/2013)  . Abdominal aortic endovascular stent graft N/A 01/29/2013    Procedure: ABDOMINAL AORTIC ENDOVASCULAR STENT GRAFT - GORE; ULTRASOUND GUIDED;  Surgeon: Nada Libman, MD;  Location: [facility] OR;  Service: Vascular;  Laterality: N/A;    Family History  Problem Relation Age of Onset  . Hypertension Mother   . Heart disease Mother   . Heart attack Mother   . Hypertension Father   . Heart disease Father   . Heart disease Brother     Allergies  Allergen Reactions  . Dust Mite Extract   . Lipitor [Atorvastatin Calcium] Other (See Comments)    Nervousness     . Metolazone Nausea Only  . Ramipril Other (See Comments)    Pt cannot remember reaction   . Zocor [Simvastatin] Other (See Comments)    Nervousness   . Lovenox [  Enoxaparin Sodium] Rash    Rash on abdomen, back, lower legs.     Current Outpatient Prescriptions on File Prior to Visit  Medication Sig Dispense Refill  . aspirin 81 MG tablet Take 81 mg by mouth every Monday, Wednesday, and Friday.       Marland Kitchen azelastine (ASTELIN) 137 MCG/SPRAY nasal spray Place 1 spray into the nose 2 (two) times daily. Use in each nostril as directed      . betamethasone valerate (VALISONE) 0.1 % cream Apply 1 application topically daily as needed (rash).      . cloNIDine (CATAPRES) 0.1 MG tablet Take 2 tablets (0.2 mg total) by mouth 2 (two) times daily.  120 tablet  5  . docusate sodium (COLACE) 100 MG capsule Take 100 mg by mouth 2 (two) times daily.      . furosemide (LASIX) 40 MG tablet Take 1 tablet (40 mg total) by mouth as directed. 2 DAILY  180 tablet  3  . guaiFENesin  (MUCINEX) 600 MG 12 hr tablet Take 600 mg by mouth 2 (two) times daily as needed for congestion. For congestion.      Marland Kitchen levothyroxine (SYNTHROID, LEVOTHROID) 25 MCG tablet Take 1 tablet (25 mcg total) by mouth daily.  30 tablet  11  . loratadine (CLARITIN) 10 MG tablet Take 10 mg by mouth daily.      . metoprolol succinate (TOPROL-XL) 25 MG 24 hr tablet Take 1 tablet (25 mg total) by mouth daily.  30 tablet  1  . mometasone (NASONEX) 50 MCG/ACT nasal spray Place 2 sprays into the nose daily as needed (for allergies).       . Multiple Vitamins-Iron (MULTIVITAMIN/IRON PO) Take by mouth daily.      Marland Kitchen OVER THE COUNTER MEDICATION Place 1 spray into both nostrils daily as needed (for allergies). neilmed nasal wash      . polyethylene glycol (MIRALAX / GLYCOLAX) packet Take 17 g by mouth as needed.      . warfarin (COUMADIN) 5 MG tablet Take 5-7.5 mg by mouth daily. 5 mg daily except 7.5 mg on Monday and Friday       No current facility-administered medications on file prior to visit.    BP 140/80  Pulse 106  Temp(Src) 97.9 F (36.6 C) (Oral)  Resp 20  Wt 171 lb (77.565 kg)  BMI 24.54 kg/m2  SpO2 94%       Review of Systems  Constitutional: Negative for fever, chills, appetite change and fatigue.  HENT: Negative for congestion, dental problem, ear pain, hearing loss, sore throat, tinnitus, trouble swallowing and voice change.   Eyes: Negative for pain, discharge and visual disturbance.  Respiratory: Positive for shortness of breath. Negative for cough, chest tightness, wheezing and stridor.   Cardiovascular: Positive for leg swelling. Negative for chest pain and palpitations.  Gastrointestinal: Negative for nausea, vomiting, abdominal pain, diarrhea, constipation, blood in stool and abdominal distention.  Genitourinary: Negative for urgency, hematuria, flank pain, discharge, difficulty urinating and genital sores.  Musculoskeletal: Negative for arthralgias, back pain, gait problem, joint  swelling, myalgias and neck stiffness.  Skin: Negative for rash.  Neurological: Negative for dizziness, syncope, speech difficulty, weakness, numbness and headaches.  Hematological: Negative for adenopathy. Does not bruise/bleed easily.  Psychiatric/Behavioral: Positive for sleep disturbance. Negative for behavioral problems and dysphoric mood. The patient is not nervous/anxious.        Objective:   Physical Exam  Constitutional: He is oriented to person, place, and time. He appears well-developed.  Pulse 106  HENT:  Head: Normocephalic.  Right Ear: External ear normal.  Left Ear: External ear normal.  Eyes: Conjunctivae and EOM are normal.  Neck: Normal range of motion.  Cardiovascular: Normal rate.   Murmur heard. Irregular rhythm with a rate of 106 Grade 2-3/6 systolic murmur-  Pulmonary/Chest: Breath sounds normal.  Abdominal: Bowel sounds are normal.  Musculoskeletal: Normal range of motion. He exhibits edema. He exhibits no tenderness.  Edema distal to both knees with stasis changes  Neurological: He is alert and oriented to person, place, and time.  Psychiatric: He has a normal mood and affect. His behavior is normal.          Assessment & Plan:   Insomnia. We'll give a trial of trazodone 25 at bedtime along with 25 mg of Benadryl Atrial fibrillation. Ventricular response not well controlled since the discontinuation of metoprolol. Patient is to see cardiology tomorrow and we'll reassess. Shortness of breath. Appears to be chronic; may be improved with control of the ventricular response Status post aVR November 2011 Status post endovascular repair of AAA  Flu vaccine administered New prescription for  trazodone dispensed

## 2013-03-18 ENCOUNTER — Ambulatory Visit (INDEPENDENT_AMBULATORY_CARE_PROVIDER_SITE_OTHER): Payer: Medicare Other | Admitting: Pharmacist

## 2013-03-18 DIAGNOSIS — Z952 Presence of prosthetic heart valve: Secondary | ICD-10-CM

## 2013-03-18 DIAGNOSIS — Z953 Presence of xenogenic heart valve: Secondary | ICD-10-CM

## 2013-03-18 DIAGNOSIS — I359 Nonrheumatic aortic valve disorder, unspecified: Secondary | ICD-10-CM

## 2013-03-18 DIAGNOSIS — I4891 Unspecified atrial fibrillation: Secondary | ICD-10-CM

## 2013-03-18 LAB — POCT INR: INR: 3.2

## 2013-03-19 ENCOUNTER — Ambulatory Visit (INDEPENDENT_AMBULATORY_CARE_PROVIDER_SITE_OTHER): Payer: Medicare Other | Admitting: *Deleted

## 2013-03-19 DIAGNOSIS — E785 Hyperlipidemia, unspecified: Secondary | ICD-10-CM

## 2013-03-19 DIAGNOSIS — I10 Essential (primary) hypertension: Secondary | ICD-10-CM

## 2013-03-19 DIAGNOSIS — I4891 Unspecified atrial fibrillation: Secondary | ICD-10-CM

## 2013-03-19 LAB — CBC WITH DIFFERENTIAL/PLATELET
Eosinophils Absolute: 0.5 10*3/uL (ref 0.0–0.7)
Eosinophils Relative: 7.7 % — ABNORMAL HIGH (ref 0.0–5.0)
HCT: 30.5 % — ABNORMAL LOW (ref 39.0–52.0)
Lymphs Abs: 0.9 10*3/uL (ref 0.7–4.0)
Monocytes Relative: 12.8 % — ABNORMAL HIGH (ref 3.0–12.0)
Platelets: 171 10*3/uL (ref 150.0–400.0)
WBC: 6.2 10*3/uL (ref 4.5–10.5)

## 2013-03-19 LAB — BASIC METABOLIC PANEL
BUN: 36 mg/dL — ABNORMAL HIGH (ref 6–23)
CO2: 32 mEq/L (ref 19–32)
Calcium: 8.8 mg/dL (ref 8.4–10.5)
Chloride: 91 mEq/L — ABNORMAL LOW (ref 96–112)
Glucose, Bld: 83 mg/dL (ref 70–99)
Potassium: 3.4 mEq/L — ABNORMAL LOW (ref 3.5–5.1)
Sodium: 132 mEq/L — ABNORMAL LOW (ref 135–145)

## 2013-03-19 LAB — LIPID PANEL
HDL: 41.6 mg/dL (ref >39.00)
LDL Cholesterol: 89 mg/dL (ref 0–99)
Total CHOL/HDL Ratio: 3
Triglycerides: 47 mg/dL (ref 0.0–149.0)
VLDL: 9.4 mg/dL (ref 0.0–40.0)

## 2013-03-19 LAB — HEPATIC FUNCTION PANEL
AST: 30 U/L (ref 0–37)
Albumin: 3.5 g/dL (ref 3.5–5.2)
Bilirubin, Direct: 0.2 mg/dL (ref 0.0–0.3)

## 2013-03-19 NOTE — Progress Notes (Signed)
Quick Note:  Please report to patient. The recent labs are stable. Continue same medication and careful diet. Hgb improving slowly. Potassium low. Add Ktab 10 meq one daily. ______

## 2013-03-20 ENCOUNTER — Telehealth: Payer: Self-pay | Admitting: *Deleted

## 2013-03-20 MED ORDER — POTASSIUM CHLORIDE ER 10 MEQ PO TBCR: 10.0000 meq | EXTENDED_RELEASE_TABLET | Freq: Every day | ORAL | Status: DC

## 2013-03-20 NOTE — Telephone Encounter (Signed)
Message copied by Burnell Blanks on Fri Mar 20, 2013 12:24 PM ------      Message from: Cassell Clement      Created: Thu Mar 19, 2013  9:37 PM       Please report to patient.  The recent labs are stable. Continue same medication and careful diet. Hgb improving slowly. Potassium low. Add Ktab 10 meq one daily. ------

## 2013-03-20 NOTE — Telephone Encounter (Signed)
Advised patient of lab results and printed copy for ov next week

## 2013-03-23 ENCOUNTER — Other Ambulatory Visit: Payer: Self-pay | Admitting: *Deleted

## 2013-03-23 MED ORDER — WARFARIN SODIUM 5 MG PO TABS: 5.0000 mg | ORAL_TABLET | ORAL | Status: DC

## 2013-03-24 ENCOUNTER — Ambulatory Visit (INDEPENDENT_AMBULATORY_CARE_PROVIDER_SITE_OTHER): Payer: Medicare Other | Admitting: Cardiology

## 2013-03-24 ENCOUNTER — Encounter: Payer: Self-pay | Admitting: Cardiology

## 2013-03-24 VITALS — BP 150/90 | HR 91 | Wt 169.0 lb

## 2013-03-24 DIAGNOSIS — Z953 Presence of xenogenic heart valve: Secondary | ICD-10-CM

## 2013-03-24 DIAGNOSIS — R0602 Shortness of breath: Secondary | ICD-10-CM

## 2013-03-24 DIAGNOSIS — R609 Edema, unspecified: Secondary | ICD-10-CM

## 2013-03-24 DIAGNOSIS — I119 Hypertensive heart disease without heart failure: Secondary | ICD-10-CM

## 2013-03-24 DIAGNOSIS — Z952 Presence of prosthetic heart valve: Secondary | ICD-10-CM

## 2013-03-24 DIAGNOSIS — I4891 Unspecified atrial fibrillation: Secondary | ICD-10-CM

## 2013-03-24 MED ORDER — HYDRALAZINE HCL 10 MG PO TABS: 10.0000 mg | ORAL_TABLET | Freq: Three times a day (TID) | ORAL | Status: DC

## 2013-03-24 NOTE — Assessment & Plan Note (Signed)
The patient is not having any orthopnea or paroxysmal nocturnal dyspnea or angina pectoris.

## 2013-03-24 NOTE — Assessment & Plan Note (Signed)
Blood pressure is still running high.  His renal function has not improved and so we will not restart losartan yet.  For additional blood pressure control we will try adding a very small dose of hydralazine 10 mg 3 times a day.

## 2013-03-24 NOTE — Assessment & Plan Note (Signed)
Patient is in permanent atrial fibrillation on Coumadin.  He has not been having a TIA symptoms.

## 2013-03-24 NOTE — Progress Notes (Signed)
Evan Perez Date of Birth:  September 23, 1922 [address] [place], [place]  [address] [phone]         Fax   [phone]  History of Present Illness: This pleasant 79 year old gentleman who is a resident of [facility] is seen for a scheduled followup visit. He has a past history of previous severe aortic stenosis. An 04/12/10 he underwent aortic valve replacement with a bovine pericardial tissue valve by Dr. Laneta Simmers. His cardiac catheterization preoperatively showed only minimal coronary artery disease and he did not require coronary artery bypass graft surgery. Postoperatively the patient had a lot of problems with fluid retention and congestive heart failure which gradually resolved.  His last echocardiogram 06/20/10 showed mild left ventricular hypertrophy with ejection fraction 50-55% and the bioprosthetic aortic valve was functioning normally. There was biatrial enlargement and there was mild to moderate tricuspid regurgitation. 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  Because of renal insufficiency his losartan was stopped while in the hospital.  Since coming home from the hospital the patient has continued to have a lot of edema.  This is gradually resolving.  He has also been having problems with pruritus and a skin rash which she attributed to his beta blocker and his beta blocker was stopped.  The patient continues to have moderate renal insufficiency with GFR in the range of 30.  The patient is having problems with insomnia and restless legs.  His primary care physician recently gave him a trial of tramadol.   Current Outpatient Prescriptions  Medication Sig Dispense Refill  . aspirin 81 MG tablet Take 81 mg by mouth every Monday, Wednesday,  and Friday.       Marland Kitchen azelastine (ASTELIN) 137 MCG/SPRAY nasal spray Place 1 spray into the nose 2 (two) times daily. Use in each nostril as directed      . betamethasone valerate (VALISONE) 0.1 % cream Apply 1 application topically daily as needed (rash).      . cloNIDine (CATAPRES) 0.1 MG tablet Take 2 tablets (0.2 mg total) by mouth 2 (two) times daily.  120 tablet  5  . diphenhydrAMINE (BENADRYL) 25 MG tablet Take 25 mg by mouth at bedtime as needed for itching.      . docusate sodium (COLACE) 100 MG capsule Take 100 mg by mouth 2 (two) times daily.      . furosemide (LASIX) 40 MG tablet Take 40 mg by mouth daily.      Marland Kitchen guaiFENesin (MUCINEX) 600 MG 12 hr tablet Take 600 mg by mouth 2 (two) times daily as needed for congestion. For congestion.      Marland Kitchen levothyroxine (SYNTHROID, LEVOTHROID) 25 MCG tablet Take 1 tablet (25 mcg total) by mouth daily.  30 tablet  11  . loratadine (CLARITIN) 10 MG tablet Take 10 mg by mouth daily.      . mometasone (NASONEX) 50 MCG/ACT nasal spray Place 2 sprays into the nose daily as needed (for allergies).       . Multiple Vitamins-Iron (MULTIVITAMIN/IRON PO) Take by mouth daily.      Marland Kitchen OVER THE COUNTER MEDICATION Place 1 spray into both nostrils daily as needed (for allergies). neilmed nasal wash      . polyethylene glycol (MIRALAX / GLYCOLAX) packet Take 17 g by  mouth as needed.      . potassium chloride (K-DUR) 10 MEQ tablet Take 1 tablet (10 mEq total) by mouth daily.  30 tablet  5  . traZODone (DESYREL) 25 mg TABS tablet Take 1 tablet (50 mg total) by mouth at bedtime as needed.  60 tablet  2  . warfarin (COUMADIN) 5 MG tablet Take 1 tablet (5 mg total) by mouth as directed.  45 tablet  3  . hydrALAZINE (APRESOLINE) 10 MG tablet Take 1 tablet (10 mg total) by mouth 3 (three) times daily.  90 tablet  3   No current facility-administered medications for this visit.    Allergies  Allergen Reactions  . Dust Mite Extract   . Lipitor [Atorvastatin Calcium] Other  (See Comments)    Nervousness     . Metolazone Nausea Only  . Ramipril Other (See Comments)    Pt cannot remember reaction   . Zocor [Simvastatin] Other (See Comments)    Nervousness   . Lovenox [Enoxaparin Sodium] Rash    Rash on abdomen, back, lower legs.     Patient Active Problem List   Diagnosis Date Noted  . Status post aortic valve replacement with bioprosthetic valve 01/15/2012    Priority: High  . Perennial allergic rhinitis 03/13/2011    Priority: High  . Drug-induced skin rash 09/12/2010    Priority: Medium  . HYPERTENSION 12/02/2006    Priority: Medium  . Atrial fibrillation 12/02/2006    Priority: Medium  . Aftercare following surgery of the circulatory system, NEC 02/16/2013  . Peripheral edema 02/11/2013  . Constipation 02/11/2013  . Acute on chronic renal failure 02/01/2013  . Chronic anticoagulation 01/26/2013  . Occlusion and stenosis of carotid artery without mention of cerebral infarction 12/29/2012  . Abdominal aneurysm without mention of rupture 12/15/2012  . AORTIC STENOSIS, SEVERE 11/01/2009  . DYSPNEA 11/01/2009  . BACK PAIN 10/03/2009  . POLYMYALGIA RHEUMATICA 04/01/2009  . ERECTILE DYSFUNCTION, ORGANIC 10/26/2008  . URI 06/24/2008  . GOUT 03/10/2007  . HYPERLIPIDEMIA 12/02/2006  . BENIGN PROSTATIC HYPERTROPHY 12/02/2006    History  Smoking status  . Former Smoker -- 0.75 packs/day for 40 years  . Types: Cigarettes  Smokeless tobacco  . Never Used    Comment: 01/26/2013 "stopped smoking for good in ~ 1985"    History  Alcohol Use  . 1.2 oz/week  . 2 Glasses of wine per week    Family History  Problem Relation Age of Onset  . Hypertension Mother   . Heart disease Mother   . Heart attack Mother   . Hypertension Father   . Heart disease Father   . Heart disease Brother     Review of Systems: Constitutional: no fever chills diaphoresis or fatigue or change in weight.  Head and neck: no hearing loss, no epistaxis, no photophobia  or visual disturbance. Respiratory: No cough, shortness of breath or wheezing. Cardiovascular: No chest pain peripheral edema, palpitations. Gastrointestinal: No abdominal distention, no abdominal pain, no change in bowel habits hematochezia or melena. Genitourinary: No dysuria, no frequency, no urgency, no nocturia. Musculoskeletal:No arthralgias, no back pain, no gait disturbance or myalgias. Neurological: No dizziness, no headaches, no numbness, no seizures, no syncope, no weakness, no tremors. Hematologic: No lymphadenopathy, no easy bruising. Psychiatric: No confusion, no hallucinations, no sleep disturbance.    Physical Exam: Filed Vitals:   03/24/13 1434  BP: 150/90  Pulse: 91   the general appearance reveals a well-developed elderly gentleman in no acute distress.  We rechecked his blood pressure and it had come down to 180/84.The head and neck exam reveals pupils equal and reactive.  Extraocular movements are full.  There is no scleral icterus.  The mouth and pharynx are normal.  The neck is supple.  The carotids reveal no bruits.  The jugular venous pressure is normal.  The  thyroid is not enlarged.  There is no lymphadenopathy.  The chest is clear to percussion and auscultation.  There are no rales or rhonchi.  Expansion of the chest is symmetrical.  The precordium is quiet.  The first heart sound is normal.  The second heart sound is physiologically split.  There is no murmur gallop rub or click.  There is no abnormal lift or heave.  The abdomen is soft and nontender.  The bowel sounds are normal.  The liver and spleen are not enlarged.  There are no abdominal masses.  There are no abdominal bruits.  Extremities reveal 4+ pitting edema up to the groin bilaterally .the surgical incisions do not appear to be infected .There is no cyanosis or clubbing.  Strength is normal and symmetrical in all extremities.  There is no lateralizing weakness.  There are no sensory deficits.  The skin is  warm and dry.  There is erythematous changes in his lower extremities bilaterally secondary to severe edema and stretching of the skin.  He does not appear to have many active cellulitis.  There does not appear to be any active skin rash at the present time.  EKG shows atrial fibrillation with controlled ventricular rate of 91 and left axis deviation.  Occasional PVCs were noted.  Assessment / Plan: Reduce Lasix down to 40 mg daily.  Add hydralazine 10 mg 3 times a day for additional afterload reduction and blood pressure control.  Recheck in one month for followup office visit and basal metabolic panel

## 2013-03-24 NOTE — Assessment & Plan Note (Signed)
His pedal edema has improved but he still has moderate erythema and swelling behind both knees.  Because of his nocturia and difficulty sleeping and because of his renal insufficiency we will try reducing his furosemide to just 40 mg daily.

## 2013-03-24 NOTE — Patient Instructions (Signed)
DECREASE YOUR LASIX (FUROSEMIDE) TO 40 MG ONCE A DAILY  ADD HYDRALAZINE 10 MG THREE TIMES A DAY  Your physician recommends that you schedule a follow-up appointment in: 1 MONTH OV/BMET

## 2013-03-31 ENCOUNTER — Telehealth: Payer: Self-pay | Admitting: Cardiology

## 2013-03-31 ENCOUNTER — Telehealth: Payer: Self-pay | Admitting: Internal Medicine

## 2013-03-31 NOTE — Telephone Encounter (Signed)
1)Has increased swelling and shortness of breath. States swelling has come back in upper legs and groin area. Lasix recently decreased at ov.   2)Unable to sleep at night secondary to not being able to relax. Encouraged him again to contact PCP regarding sleeping issues.    Will forward to  Dr. Patty Sermons for review

## 2013-03-31 NOTE — Telephone Encounter (Signed)
Advised patient, verbalized understanding  

## 2013-03-31 NOTE — Telephone Encounter (Signed)
Patient Information:  Caller Name: Gumaro  Phone: [phone]  Patient: Evan, Perez  Gender: Male  DOB: Oct 14, 1922  Age: 79 Years  PCP: Eleonore Chiquito (Family Practice > 79yrs old)  Office Follow Up:  Does the office need to follow up with this patient?: Yes  Instructions For The Office: Please f/u with pt, thank you.   Symptoms  Reason For Call & Symptoms: Pt states he is not sleeping well at night.  Pt states he would like to discuss his medication with provider.  Reviewed Health History In EMR: Yes  Reviewed Medications In EMR: Yes  Reviewed Allergies In EMR: Yes  Reviewed Surgeries / Procedures: Yes  Date of Onset of Symptoms: 02/02/2013  Guideline(s) Used:  No Protocol Available - Information Only  Disposition Per Guideline:   Discuss with PCP and Callback by Nurse Today  Reason For Disposition Reached:   Nursing judgment  Advice Given:  Call Back If:  New symptoms develop  You become worse.  Patient Refused Recommendation:  Patient Will Follow Up With Office Later  Pt requesting f/u call with recommendations per provider.

## 2013-03-31 NOTE — Telephone Encounter (Signed)
Increase Lasix back to 80 mg each morning.

## 2013-03-31 NOTE — Telephone Encounter (Signed)
New Problem    Pt is unable to sleep for the passed 60 day since his bypass w/Dr Orlean Bradford.   Pt need to speak to some one about some swelling and really needs to sleep.     Give him a call please   Thanks!

## 2013-04-01 ENCOUNTER — Ambulatory Visit (INDEPENDENT_AMBULATORY_CARE_PROVIDER_SITE_OTHER): Payer: Medicare Other | Admitting: Pharmacist

## 2013-04-01 DIAGNOSIS — Z953 Presence of xenogenic heart valve: Secondary | ICD-10-CM

## 2013-04-01 DIAGNOSIS — I4891 Unspecified atrial fibrillation: Secondary | ICD-10-CM

## 2013-04-01 DIAGNOSIS — I359 Nonrheumatic aortic valve disorder, unspecified: Secondary | ICD-10-CM

## 2013-04-01 DIAGNOSIS — Z952 Presence of prosthetic heart valve: Secondary | ICD-10-CM

## 2013-04-01 LAB — POCT INR: INR: 4.7

## 2013-04-03 NOTE — Telephone Encounter (Signed)
Pt states he is to call you back concerning his sleep issue.   Doubling of the dosage lasix and turning radio on to soft music on low volume pt is sleeping better. Pt still not sleeping real good, but better!

## 2013-04-03 NOTE — Telephone Encounter (Signed)
FYI

## 2013-04-08 ENCOUNTER — Ambulatory Visit (INDEPENDENT_AMBULATORY_CARE_PROVIDER_SITE_OTHER): Payer: Medicare Other | Admitting: Pharmacist

## 2013-04-08 DIAGNOSIS — I359 Nonrheumatic aortic valve disorder, unspecified: Secondary | ICD-10-CM

## 2013-04-08 DIAGNOSIS — Z953 Presence of xenogenic heart valve: Secondary | ICD-10-CM

## 2013-04-08 DIAGNOSIS — I4891 Unspecified atrial fibrillation: Secondary | ICD-10-CM

## 2013-04-08 DIAGNOSIS — Z952 Presence of prosthetic heart valve: Secondary | ICD-10-CM

## 2013-04-08 LAB — POCT INR: INR: 2.4

## 2013-04-13 ENCOUNTER — Encounter: Payer: Self-pay | Admitting: Internal Medicine

## 2013-04-13 ENCOUNTER — Ambulatory Visit (INDEPENDENT_AMBULATORY_CARE_PROVIDER_SITE_OTHER): Payer: Medicare Other | Admitting: Internal Medicine

## 2013-04-13 VITALS — BP 130/80 | HR 90 | Temp 97.9°F | Resp 20 | Wt 191.0 lb

## 2013-04-13 DIAGNOSIS — R599 Enlarged lymph nodes, unspecified: Secondary | ICD-10-CM

## 2013-04-13 DIAGNOSIS — R590 Localized enlarged lymph nodes: Secondary | ICD-10-CM | POA: Insufficient documentation

## 2013-04-13 DIAGNOSIS — R0602 Shortness of breath: Secondary | ICD-10-CM

## 2013-04-13 DIAGNOSIS — Z953 Presence of xenogenic heart valve: Secondary | ICD-10-CM

## 2013-04-13 DIAGNOSIS — Z952 Presence of prosthetic heart valve: Secondary | ICD-10-CM

## 2013-04-13 DIAGNOSIS — I4891 Unspecified atrial fibrillation: Secondary | ICD-10-CM

## 2013-04-13 DIAGNOSIS — N189 Chronic kidney disease, unspecified: Secondary | ICD-10-CM

## 2013-04-13 DIAGNOSIS — Z7901 Long term (current) use of anticoagulants: Secondary | ICD-10-CM

## 2013-04-13 DIAGNOSIS — R600 Localized edema: Secondary | ICD-10-CM

## 2013-04-13 DIAGNOSIS — Z48812 Encounter for surgical aftercare following surgery on the circulatory system: Secondary | ICD-10-CM

## 2013-04-13 DIAGNOSIS — R609 Edema, unspecified: Secondary | ICD-10-CM

## 2013-04-13 DIAGNOSIS — N179 Acute kidney failure, unspecified: Secondary | ICD-10-CM

## 2013-04-13 MED ORDER — FUROSEMIDE 40 MG PO TABS: 80.0000 mg | ORAL_TABLET | Freq: Two times a day (BID) | ORAL | Status: DC

## 2013-04-13 MED ORDER — POTASSIUM CHLORIDE ER 10 MEQ PO TBCR: 10.0000 meq | EXTENDED_RELEASE_TABLET | Freq: Two times a day (BID) | ORAL | Status: DC

## 2013-04-13 NOTE — Patient Instructions (Signed)
Followup with cardiology and vascular surgery this week Furosemide 80 mg twice a day Increase potassium to a twice-daily regimen  Limit your sodium (Salt) intake  Call or return to clinic prn if these symptoms worsen or fail to improve as anticipated.

## 2013-04-13 NOTE — Progress Notes (Signed)
Subjective:    Patient ID: Evan Perez, male    DOB: Nov 11, 1922, 79 y.o.   MRN: [id number]  HPI Pre-visit discussion using our clinic review tool. No additional management support is needed unless otherwise documented below in the visit note.  79 year old patient who is status post bioprosthetic aVR who has chronic atrial for ablation and remains on chronic Coumadin anticoagulation. He is status post endovascular abdominal aortic aneurysm repair on August 28. Postoperatively he has had a progressive refractory lower extremity edema. Over the past month he has gained an additional 20 pounds of weight  The patient had an INR performed 5 days ago that was therapeutic at 2.4. Creatinine 3 weeks ago 2.3 and stable. He did have an abdominal CT scan performed on October 6 that revealed the stents to be well positioned. It did reveal prominent gastrohepatic and retroperitoneal adenopathy.  The patient has been on furosemide 80 mg daily with worsening edema.  His insomnia is much improved but he does sleep in a recliner.  No frank PND or orthopnea    Wt Readings from Last 3 Encounters:  04/13/13 191 lb (86.637 kg)  03/24/13 169 lb (76.658 kg)  03/17/13 171 lb (77.565 kg)   Past Medical History  Diagnosis Date  . History of aortic stenosis     Severe s/p bovine pericardial tissue AVR 04/2010  . Hypertension   . Fall 2011    FRACTURE VERTEBRAL  . CHF (congestive heart failure)     LV DYSFUNCTION -EF 55% JAN 2012  . Permanent atrial fibrillation     On chronic Coumadin anticoagulation  . Cancer   . CAD (coronary artery disease)     Nonobstructive by 2011 cath  . Hyperlipidemia   . Complication of anesthesia     "drowsy for 2 days after OR" (01/26/2013)  . AAA (abdominal aortic aneurysm)     AAA/left iliac aneurysm (3.9 cm)/notes 01/26/2013    History   Social History  . Marital Status: Widowed    Spouse Name: N/A    Number of Children: N/A  . Years of Education: N/A    Occupational History  . Not on file.   Social History Main Topics  . Smoking status: Former Smoker -- 0.75 packs/day for 40 years    Types: Cigarettes  . Smokeless tobacco: Never Used     Comment: 01/26/2013 "stopped smoking for good in ~ 1985"  . Alcohol Use: 1.2 oz/week    2 Glasses of wine per week  . Drug Use: No  . Sexual Activity: Not Currently   Other Topics Concern  . Not on file   Social History Narrative  . No narrative on file    Past Surgical History  Procedure Laterality Date  . Aortic valve replacement      NOV 2011-TISSUE VALVE, 23 mm Mitroflow aortic pericardial heart valve  . Cardiac catheterization      03/2010-non obstructive CAD; severe AS  . Vasectomy    . Tonsillectomy    . Cardiac valve replacement    . Embolization      coil embolization of the left hypogastric artery prior to endovascular repair/notes 01/26/2013  . Cataract extraction w/ intraocular lens  implant, bilateral Bilateral     "right ~ 1999; left ~ 2012" (01/26/2013)  . Abdominal aortic endovascular stent graft N/A 01/29/2013    Procedure: ABDOMINAL AORTIC ENDOVASCULAR STENT GRAFT - GORE; ULTRASOUND GUIDED;  Surgeon: Nada Libman, MD;  Location: [facility] OR;  Service: Vascular;  Laterality: N/A;    Family History  Problem Relation Age of Onset  . Hypertension Mother   . Heart disease Mother   . Heart attack Mother   . Hypertension Father   . Heart disease Father   . Heart disease Brother     Allergies  Allergen Reactions  . Dust Mite Extract   . Lipitor [Atorvastatin Calcium] Other (See Comments)    Nervousness     . Metolazone Nausea Only  . Ramipril Other (See Comments)    Pt cannot remember reaction   . Zocor [Simvastatin] Other (See Comments)    Nervousness   . Lovenox [Enoxaparin Sodium] Rash    Rash on abdomen, back, lower legs.     Current Outpatient Prescriptions on File Prior to Visit  Medication Sig Dispense Refill  . aspirin 81 MG tablet Take 81 mg by mouth  every Monday, Wednesday, and Friday.       Marland Kitchen azelastine (ASTELIN) 137 MCG/SPRAY nasal spray Place 1 spray into the nose 2 (two) times daily. Use in each nostril as directed      . betamethasone valerate (VALISONE) 0.1 % cream Apply 1 application topically daily as needed (rash).      . cloNIDine (CATAPRES) 0.1 MG tablet Take 2 tablets (0.2 mg total) by mouth 2 (two) times daily.  120 tablet  5  . diphenhydrAMINE (BENADRYL) 25 MG tablet Take 25 mg by mouth at bedtime as needed for itching.      . docusate sodium (COLACE) 100 MG capsule Take 100 mg by mouth 2 (two) times daily.      . furosemide (LASIX) 40 MG tablet Take 80 mg by mouth daily.       Marland Kitchen guaiFENesin (MUCINEX) 600 MG 12 hr tablet Take 600 mg by mouth 2 (two) times daily as needed for congestion. For congestion.      . hydrALAZINE (APRESOLINE) 10 MG tablet Take 1 tablet (10 mg total) by mouth 3 (three) times daily.  90 tablet  3  . levothyroxine (SYNTHROID, LEVOTHROID) 25 MCG tablet Take 1 tablet (25 mcg total) by mouth daily.  30 tablet  11  . loratadine (CLARITIN) 10 MG tablet Take 10 mg by mouth daily.      . mometasone (NASONEX) 50 MCG/ACT nasal spray Place 2 sprays into the nose daily as needed (for allergies).       . Multiple Vitamins-Iron (MULTIVITAMIN/IRON PO) Take by mouth daily.      Marland Kitchen OVER THE COUNTER MEDICATION Place 1 spray into both nostrils daily as needed (for allergies). neilmed nasal wash      . polyethylene glycol (MIRALAX / GLYCOLAX) packet Take 17 g by mouth as needed.      . potassium chloride (K-DUR) 10 MEQ tablet Take 1 tablet (10 mEq total) by mouth daily.  30 tablet  5  . traZODone (DESYREL) 25 mg TABS tablet Take 1 tablet (50 mg total) by mouth at bedtime as needed.  60 tablet  2  . warfarin (COUMADIN) 5 MG tablet Take 1 tablet (5 mg total) by mouth as directed.  45 tablet  3   No current facility-administered medications on file prior to visit.    BP 130/80  Pulse 90  Temp(Src) 97.9 F (36.6 C) (Oral)   Resp 20  Wt 191 lb (86.637 kg)  SpO2 95%    Review of Systems  Constitutional: Negative for fever, chills, appetite change and fatigue.  HENT: Negative for congestion, dental problem, ear pain, hearing loss,  sore throat, tinnitus, trouble swallowing and voice change.   Eyes: Negative for pain, discharge and visual disturbance.  Respiratory: Positive for shortness of breath. Negative for cough, chest tightness, wheezing and stridor.   Cardiovascular: Positive for leg swelling. Negative for chest pain and palpitations.  Gastrointestinal: Negative for nausea, vomiting, abdominal pain, diarrhea, constipation, blood in stool and abdominal distention.  Genitourinary: Negative for urgency, hematuria, flank pain, discharge, difficulty urinating and genital sores.  Musculoskeletal: Negative for arthralgias, back pain, gait problem, joint swelling, myalgias and neck stiffness.  Skin: Negative for rash.  Neurological: Positive for weakness. Negative for dizziness, syncope, speech difficulty, numbness and headaches.  Hematological: Negative for adenopathy. Does not bruise/bleed easily.  Psychiatric/Behavioral: Positive for sleep disturbance. Negative for behavioral problems and dysphoric mood. The patient is not nervous/anxious.        Objective:   Physical Exam  Constitutional: He is oriented to person, place, and time. He appears well-developed.  HENT:  Head: Normocephalic.  Right Ear: External ear normal.  Left Ear: External ear normal.  Eyes: Conjunctivae and EOM are normal.  Neck: Normal range of motion. No JVD present.  Cardiovascular: Normal rate.   Murmur heard. Irregular rhythm with controlled ventricular response Grade 3/6 systolic murmur  Pulmonary/Chest: Effort normal.  Decreased breath sounds the right base with some bibasilar crackles  Abdominal: Bowel sounds are normal. He exhibits distension. There is no tenderness. There is no rebound and no guarding.   Bulging flanks and  shifting dullness noted consistent with ascites  Musculoskeletal: Normal range of motion. He exhibits edema. He exhibits no tenderness.  Ascites present Prominent edema in the thighs and less marked distally Scrotal and penile edema noted  Neurological: He is alert and oriented to person, place, and time.  Psychiatric: He has a normal mood and affect. His behavior is normal.          Assessment & Plan:   Progressive lower extremity edema and probable ascites Prominent gastrohepatic and retroperitoneal adenopathy noted on CT scan-unclear significance Status post recent endovascular repair of abdominal and iliac artery aneurysms on August 28 Chronic atrial fibrillation Chronic Coumadin anticoagulation  We'll increase furosemide to 80 twice a day and continue Coumadin anticoagulation Will double up potassium supplementation Followup cardiology and vascular surgery this week

## 2013-04-13 NOTE — Progress Notes (Signed)
Pre-visit discussion using our clinic review tool. No additional management support is needed unless otherwise documented below in the visit note.  

## 2013-04-17 ENCOUNTER — Encounter: Payer: Self-pay | Admitting: Internal Medicine

## 2013-04-17 ENCOUNTER — Ambulatory Visit (INDEPENDENT_AMBULATORY_CARE_PROVIDER_SITE_OTHER): Payer: Medicare Other | Admitting: Internal Medicine

## 2013-04-17 ENCOUNTER — Telehealth: Payer: Self-pay | Admitting: Internal Medicine

## 2013-04-17 ENCOUNTER — Encounter: Payer: Self-pay | Admitting: Surgery

## 2013-04-17 VITALS — BP 180/88 | HR 85 | Temp 97.5°F | Wt 190.0 lb

## 2013-04-17 DIAGNOSIS — I4891 Unspecified atrial fibrillation: Secondary | ICD-10-CM

## 2013-04-17 DIAGNOSIS — Z7901 Long term (current) use of anticoagulants: Secondary | ICD-10-CM

## 2013-04-17 DIAGNOSIS — R609 Edema, unspecified: Secondary | ICD-10-CM

## 2013-04-17 DIAGNOSIS — R599 Enlarged lymph nodes, unspecified: Secondary | ICD-10-CM

## 2013-04-17 DIAGNOSIS — R590 Localized enlarged lymph nodes: Secondary | ICD-10-CM

## 2013-04-17 NOTE — Progress Notes (Signed)
Subjective:    Patient ID: Evan Perez, male    DOB: 02/07/1923, 79 y.o.   MRN: [id number]  HPI  79 year old patient who is seen today for followup.  He is status post abdominal aortic endovascular stent graft on 01/29/2013. Postoperatively he has had considerable lower extremity edema.  This has worsened in spite of increasing diuretic dosing. He sleeps in a recliner and has no orthopnea or PND. He has had progressive ascites associated with penile scrotal and lower extremity edema.  He is quite uncomfortable. He states that when he underwent aVR in November of 2011, hospital course was  complicated by considerable lower extremity edema and he states that he was in the hospital for an additional 2 weeks.  Wt Readings from Last 3 Encounters:  04/17/13 190 lb (86.183 kg)  04/13/13 191 lb (86.637 kg)  03/24/13 169 lb (76.658 kg)   Past Medical History  Diagnosis Date  . History of aortic stenosis     Severe s/p bovine pericardial tissue AVR 04/2010  . Hypertension   . Fall 2011    FRACTURE VERTEBRAL  . CHF (congestive heart failure)     LV DYSFUNCTION -EF 55% JAN 2012  . Permanent atrial fibrillation     On chronic Coumadin anticoagulation  . Cancer   . CAD (coronary artery disease)     Nonobstructive by 2011 cath  . Hyperlipidemia   . Complication of anesthesia     "drowsy for 2 days after OR" (01/26/2013)  . AAA (abdominal aortic aneurysm)     AAA/left iliac aneurysm (3.9 cm)/notes 01/26/2013    History   Social History  . Marital Status: Widowed    Spouse Name: N/A    Number of Children: N/A  . Years of Education: N/A   Occupational History  . Not on file.   Social History Main Topics  . Smoking status: Former Smoker -- 0.75 packs/day for 40 years    Types: Cigarettes  . Smokeless tobacco: Never Used     Comment: 01/26/2013 "stopped smoking for good in ~ 1985"  . Alcohol Use: 1.2 oz/week    2 Glasses of wine per week  . Drug Use: No  . Sexual Activity: Not  Currently   Other Topics Concern  . Not on file   Social History Narrative  . No narrative on file    Past Surgical History  Procedure Laterality Date  . Aortic valve replacement      NOV 2011-TISSUE VALVE, 23 mm Mitroflow aortic pericardial heart valve  . Cardiac catheterization      03/2010-non obstructive CAD; severe AS  . Vasectomy    . Tonsillectomy    . Cardiac valve replacement    . Embolization      coil embolization of the left hypogastric artery prior to endovascular repair/notes 01/26/2013  . Cataract extraction w/ intraocular lens  implant, bilateral Bilateral     "right ~ 1999; left ~ 2012" (01/26/2013)  . Abdominal aortic endovascular stent graft N/A 01/29/2013    Procedure: ABDOMINAL AORTIC ENDOVASCULAR STENT GRAFT - GORE; ULTRASOUND GUIDED;  Surgeon: Nada Libman, MD;  Location: [facility] OR;  Service: Vascular;  Laterality: N/A;    Family History  Problem Relation Age of Onset  . Hypertension Mother   . Heart disease Mother   . Heart attack Mother   . Hypertension Father   . Heart disease Father   . Heart disease Brother     Allergies  Allergen Reactions  . Dust Mite  Extract   . Lipitor [Atorvastatin Calcium] Other (See Comments)    Nervousness     . Metolazone Nausea Only  . Ramipril Other (See Comments)    Pt cannot remember reaction   . Zocor [Simvastatin] Other (See Comments)    Nervousness   . Lovenox [Enoxaparin Sodium] Rash    Rash on abdomen, back, lower legs.     Current Outpatient Prescriptions on File Prior to Visit  Medication Sig Dispense Refill  . aspirin 81 MG tablet Take 81 mg by mouth every Monday, Wednesday, and Friday.       Marland Kitchen azelastine (ASTELIN) 137 MCG/SPRAY nasal spray Place 1 spray into the nose 2 (two) times daily. Use in each nostril as directed      . betamethasone valerate (VALISONE) 0.1 % cream Apply 1 application topically daily as needed (rash).      . cloNIDine (CATAPRES) 0.1 MG tablet Take 2 tablets (0.2 mg total) by  mouth 2 (two) times daily.  120 tablet  5  . diphenhydrAMINE (BENADRYL) 25 MG tablet Take 25 mg by mouth at bedtime as needed for itching.      . docusate sodium (COLACE) 100 MG capsule Take 100 mg by mouth 2 (two) times daily.      . furosemide (LASIX) 40 MG tablet Take 2 tablets (80 mg total) by mouth 2 (two) times daily.  90 tablet  8  . guaiFENesin (MUCINEX) 600 MG 12 hr tablet Take 600 mg by mouth 2 (two) times daily as needed for congestion. For congestion.      . hydrALAZINE (APRESOLINE) 10 MG tablet Take 1 tablet (10 mg total) by mouth 3 (three) times daily.  90 tablet  3  . levothyroxine (SYNTHROID, LEVOTHROID) 25 MCG tablet Take 1 tablet (25 mcg total) by mouth daily.  30 tablet  11  . loratadine (CLARITIN) 10 MG tablet Take 10 mg by mouth daily.      . mometasone (NASONEX) 50 MCG/ACT nasal spray Place 2 sprays into the nose daily as needed (for allergies).       . Multiple Vitamins-Iron (MULTIVITAMIN/IRON PO) Take by mouth daily.      Marland Kitchen OVER THE COUNTER MEDICATION Place 1 spray into both nostrils daily as needed (for allergies). neilmed nasal wash      . polyethylene glycol (MIRALAX / GLYCOLAX) packet Take 17 g by mouth as needed.      . potassium chloride (K-DUR) 10 MEQ tablet Take 1 tablet (10 mEq total) by mouth 2 (two) times daily.  90 tablet  5  . traZODone (DESYREL) 25 mg TABS tablet Take 1 tablet (50 mg total) by mouth at bedtime as needed.  60 tablet  2  . warfarin (COUMADIN) 5 MG tablet Take 1 tablet (5 mg total) by mouth as directed.  45 tablet  3   No current facility-administered medications on file prior to visit.    BP 180/88  Pulse 85  Temp(Src) 97.5 F (36.4 C) (Oral)  Wt 190 lb (86.183 kg)  SpO2 88%     Review of Systems  Constitutional: Positive for fatigue. Negative for fever, chills and appetite change.  HENT: Negative for congestion, dental problem, ear pain, hearing loss, sore throat, tinnitus, trouble swallowing and voice change.   Eyes: Negative for  pain, discharge and visual disturbance.  Respiratory: Positive for shortness of breath. Negative for cough, chest tightness, wheezing and stridor.   Cardiovascular: Positive for leg swelling. Negative for chest pain and palpitations.  Gastrointestinal: Positive  for abdominal distention. Negative for nausea, vomiting, abdominal pain, diarrhea, constipation and blood in stool.  Genitourinary: Positive for penile swelling and scrotal swelling. Negative for urgency, hematuria, flank pain, discharge, difficulty urinating and genital sores.  Musculoskeletal: Negative for arthralgias, back pain, gait problem, joint swelling, myalgias and neck stiffness.  Skin: Negative for rash.  Neurological: Negative for dizziness, syncope, speech difficulty, weakness, numbness and headaches.  Hematological: Negative for adenopathy. Does not bruise/bleed easily.  Psychiatric/Behavioral: Negative for behavioral problems and dysphoric mood. The patient is not nervous/anxious.        Objective:   Physical Exam  Constitutional: He is oriented to person, place, and time. He appears well-developed and well-nourished. No distress.  Blood pressure 180/80 Pulse rate 64 Weight 190  HENT:  Head: Normocephalic.  Right Ear: External ear normal.  Left Ear: External ear normal.  Eyes: Conjunctivae and EOM are normal.  Neck: Normal range of motion. No JVD present.  No significant neck vein distention  Cardiovascular: Normal rate.   Murmur heard. Irregular rhythm with controlled ventricular response Grade 2/6 brief systolic murmur  Pulmonary/Chest:  A few basilar crackles and diminished breath sounds at both bases O2 saturation 92%  Abdominal: Soft. Bowel sounds are normal. He exhibits distension. There is no tenderness. There is no rebound and no guarding.  Prominent ascites with bulging flanks  Genitourinary:  Scrotal and penile edema  Musculoskeletal: Normal range of motion. He exhibits no edema and no tenderness.   Marked tight edematous changes of legs more prominent proximally  Neurological: He is alert and oriented to person, place, and time.  Psychiatric: He has a normal mood and affect. His behavior is normal.          Assessment & Plan:   Lower extremity edema. The patient is scheduled to see vascular surgery in followup on Monday. Hospital admission today offered. He wishes to defer unless there is any clinical deterioration. His status has been basically the same  for weeks.  The patient also has an apartment to see cardiology next week.  Hospital admission if any deterioration Needs abdominal Doppler ultrasonography next week Furosemide up titrated slightly to 120 mg AM and 80 mg after blood. Continue potassium supplementation. Check electrolytes on Monday

## 2013-04-17 NOTE — Telephone Encounter (Signed)
Patient Information:  Caller Name: [facility]  Phone: [phone]  Patient: Evan Perez, Evan Perez  Gender: Male  DOB: 11-08-1922  Age: 79 Years  PCP: Eleonore Chiquito (Family Practice > 75yrs old)  Office Follow Up:  Does the office need to follow up with this patient?: No  Instructions For The Office: N/A  RN Note:  Seen in office 04/13/13 for increasing knee and thigh swelling; lasix and potassium doses were doubled.  States he cannot get his socks on.  States his swelling has not improved after 4 days.  States he has "not lost any fluid whatsoever."  Has lost 4 lbs according to his scale, but sees no improvement in his swelling.  States his lower legs and ankles "feel tight, but are not swollen."  States his discomfort in walking (in terms of knee pain) is worse than when seen.  Per edema protocol, emergent symptoms denied; advised appt within 72 hours due to swelling not improving; appt scheduled 04/17/13 1615 with Dr. Amador Cunas.  krs/can  Symptoms  Reason For Call & Symptoms: seen in office 04/16/13  Reviewed Health History In EMR: Yes  Reviewed Medications In EMR: Yes  Reviewed Allergies In EMR: Yes  Reviewed Surgeries / Procedures: Yes  Date of Onset of Symptoms: Unknown  Guideline(s) Used:  Leg Swelling and Edema  Disposition Per Guideline:   See Within 3 Days in Office  Reason For Disposition Reached:   Mild swelling of both ankles (i.e., pedal edema) AND new onset or worsening  Advice Given:  N/A  Patient Will Follow Care Advice:  YES  Appointment Scheduled:  04/17/2013 16:15:00 Appointment Scheduled Provider:  Eleonore Chiquito (Family Practice > 21yrs old)

## 2013-04-20 ENCOUNTER — Ambulatory Visit (INDEPENDENT_AMBULATORY_CARE_PROVIDER_SITE_OTHER): Payer: Self-pay | Admitting: Surgery

## 2013-04-20 ENCOUNTER — Encounter: Payer: Self-pay | Admitting: Surgery

## 2013-04-20 ENCOUNTER — Other Ambulatory Visit: Payer: Self-pay | Admitting: *Deleted

## 2013-04-20 ENCOUNTER — Ambulatory Visit (HOSPITAL_COMMUNITY)
Admission: RE | Admit: 2013-04-20 | Discharge: 2013-04-20 | Disposition: A | Discharge status: Home / Self Care | Payer: Medicare Other | Source: Ambulatory Visit | Attending: Surgery | Admitting: Surgery

## 2013-04-20 VITALS — BP 176/86 | HR 79 | Ht 70.0 in | Wt 190.8 lb

## 2013-04-20 DIAGNOSIS — R609 Edema, unspecified: Secondary | ICD-10-CM

## 2013-04-20 DIAGNOSIS — I739 Peripheral vascular disease, unspecified: Secondary | ICD-10-CM | POA: Insufficient documentation

## 2013-04-20 NOTE — Progress Notes (Signed)
[facility]   Patient name: Evan Perez MRN: [id number] DOB: 1922-12-08 Sex: male     Chief Complaint  Patient presents with  . Re-evaluation    edema of upper thigh to ankle     HISTORY OF PRESENT ILLNESS: The patient comes in today for followup. On 01/06/2013 he underwent coil embolization of the left hypogastric artery in anticipation of endovascular repair of an abdominal and iliac aneurysm. He is chronically maintained on Coumadin. He was bridged with Lovenox for his arteriogram however he developed a total body rash secondary to Lovenox. I gave him several weeks to recover from this and then we scheduled his aneurysm repair. This was done on 01/29/2013.    The patient's biggest complaint is that of lower extremity edema.  He has been on high dose diuretics and still has problems in his abdomen, scrotum and thigh region.  Past Medical History  Diagnosis Date  . History of aortic stenosis     Severe s/p bovine pericardial tissue AVR 04/2010  . Hypertension   . Fall 2011    FRACTURE VERTEBRAL  . CHF (congestive heart failure)     LV DYSFUNCTION -EF 55% JAN 2012  . Permanent atrial fibrillation     On chronic Coumadin anticoagulation  . Cancer   . CAD (coronary artery disease)     Nonobstructive by 2011 cath  . Hyperlipidemia   . Complication of anesthesia     "drowsy for 2 days after OR" (01/26/2013)  . 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 Aortic valve replacement      NOV 2011-TISSUE VALVE, 23 mm Mitroflow aortic pericardial heart valve  . Cardiac catheterization      03/2010-non obstructive CAD; severe AS  . Vasectomy    . Tonsillectomy    . Cardiac valve replacement    . Embolization      coil embolization of the left hypogastric artery prior to endovascular repair/notes 01/26/2013  . Cataract extraction w/ intraocular lens   implant, bilateral Bilateral     "right ~ 1999; left ~ 2012" (01/26/2013)  . Abdominal aortic endovascular stent graft N/A 01/29/2013    Procedure: ABDOMINAL AORTIC ENDOVASCULAR STENT GRAFT - GORE; ULTRASOUND GUIDED;  Surgeon: Nada Libman, MD;  Location: [facility] OR;  Service: Vascular;  Laterality: N/A;    History   Social History  . Marital Status: Widowed    Spouse Name: N/A    Number of Children: N/A  . Years of Education: N/A   Occupational History  . Not on file.   Social History Main Topics  . Smoking status: Former Smoker -- 0.75 packs/day for 40 years    Types: Cigarettes  . Smokeless tobacco: Never Used     Comment: 01/26/2013 "stopped smoking for good in ~ 1985"  . Alcohol Use: 1.2 oz/week    2 Glasses of wine per week  . Drug Use: No  . Sexual Activity: Not Currently   Other Topics Concern  . Not on file   Social History Narrative  . No narrative on file    Family History  Problem Relation Age of Onset  . Hypertension Mother   . Heart disease Mother   . Heart attack Mother   . Hypertension Father   . Heart disease Father   . Heart disease Brother     Allergies as of 04/20/2013 -  Review Complete 04/20/2013  Allergen Reaction Noted  . Dust mite extract  05/14/2012  . Lipitor [atorvastatin calcium] Other (See Comments) 09/12/2010  . Metolazone Nausea Only 02/17/2013  . Ramipril Other (See Comments) 09/12/2010  . Zocor [simvastatin] Other (See Comments) 09/12/2010  . Lovenox [enoxaparin sodium] Rash 01/05/2013    Current Outpatient Prescriptions on File Prior to Visit  Medication Sig Dispense Refill  . aspirin 81 MG tablet Take 81 mg by mouth every Monday, Wednesday, and Friday.       Marland Kitchen azelastine (ASTELIN) 137 MCG/SPRAY nasal spray Place 1 spray into the nose 2 (two) times daily. Use in each nostril as directed      . betamethasone valerate (VALISONE) 0.1 % cream Apply 1 application topically daily as needed (rash).      . cloNIDine (CATAPRES) 0.1 MG  tablet Take 2 tablets (0.2 mg total) by mouth 2 (two) times daily.  120 tablet  5  . diphenhydrAMINE (BENADRYL) 25 MG tablet Take 25 mg by mouth at bedtime as needed for itching.      . docusate sodium (COLACE) 100 MG capsule Take 100 mg by mouth 2 (two) times daily.      . furosemide (LASIX) 40 MG tablet Take 2 tablets (80 mg total) by mouth 2 (two) times daily.  90 tablet  8  . guaiFENesin (MUCINEX) 600 MG 12 hr tablet Take 600 mg by mouth 2 (two) times daily as needed for congestion. For congestion.      . hydrALAZINE (APRESOLINE) 10 MG tablet Take 1 tablet (10 mg total) by mouth 3 (three) times daily.  90 tablet  3  . levothyroxine (SYNTHROID, LEVOTHROID) 25 MCG tablet Take 1 tablet (25 mcg total) by mouth daily.  30 tablet  11  . loratadine (CLARITIN) 10 MG tablet Take 10 mg by mouth daily.      . mometasone (NASONEX) 50 MCG/ACT nasal spray Place 2 sprays into the nose daily as needed (for allergies).       . Multiple Vitamins-Iron (MULTIVITAMIN/IRON PO) Take by mouth daily.      Marland Kitchen OVER THE COUNTER MEDICATION Place 1 spray into both nostrils daily as needed (for allergies). neilmed nasal wash      . polyethylene glycol (MIRALAX / GLYCOLAX) packet Take 17 g by mouth as needed.      . potassium chloride (K-DUR) 10 MEQ tablet Take 1 tablet (10 mEq total) by mouth 2 (two) times daily.  90 tablet  5  . traZODone (DESYREL) 25 mg TABS tablet Take 1 tablet (50 mg total) by mouth at bedtime as needed.  60 tablet  2  . warfarin (COUMADIN) 5 MG tablet Take 1 tablet (5 mg total) by mouth as directed.  45 tablet  3   No current facility-administered medications on file prior to visit.      PHYSICAL EXAMINATION:   Vital signs are BP 176/86  Pulse 79  Ht 5\' 10"  (1.778 m)  Wt 190 lb 12.8 oz (86.546 kg)  BMI 27.38 kg/m2  SpO2 100% General: The patient appears their stated age. HEENT:  No gross abnormalities Pulmonary:  Non labored breathing Abdomen: Soft and non-tender positive scrotal  edema. Musculoskeletal: There are no major deformities. Neurologic: No focal weakness or paresthesias are detected, Skin: There are no ulcer or rashes noted. Psychiatric: The patient has normal affect. Cardiovascular: There is a regular rate and rhythm without significant murmur appreciated.   Diagnostic Studies Abdominal ultrasound was performed today.  The aorta could not  be visualized secondary to bowel gas.  There was a moderate amount of ascites.  The patient had bilateral pleural effusions.  His inferior vena cava and common iliac veins were patent without evidence of thrombosis or external compression.  Pulsatile flow was noted bilaterally.  There were large lymph nodes in the groin.  The patient had triphasic waveforms in bilateral femoral arteries.  Assessment: Status post endovascular aneurysm repair Plan: The patient has significant swelling as evidenced by pleural effusions, abdominal ascites, scrotal edema, and bilateral thigh edema.  Ultrasound today shows no evidence of venous thrombosis.  Pulsatility was identified and bilateral iliac veins.  This along with dilated vena cava is suggestive of congestive failure.  The patient is scheduled to see his cardiologist Dr. Connye Burkitt this Thursday.  I have scheduled to see me back in one month.  I told her that I do not see anything technically problematic from his aneurysm repair.  Although his issues date back to that date this could potentially have exacerbated a underlying cardiac issue  V. Charlena Cross, M.D. [facility] Office: [phone] Pager:  [phone]

## 2013-04-23 ENCOUNTER — Other Ambulatory Visit: Payer: Medicare Other

## 2013-04-23 ENCOUNTER — Ambulatory Visit (INDEPENDENT_AMBULATORY_CARE_PROVIDER_SITE_OTHER): Payer: Medicare Other | Admitting: *Deleted

## 2013-04-23 ENCOUNTER — Encounter: Payer: Self-pay | Admitting: Cardiology

## 2013-04-23 ENCOUNTER — Ambulatory Visit (INDEPENDENT_AMBULATORY_CARE_PROVIDER_SITE_OTHER): Payer: Medicare Other | Admitting: Cardiology

## 2013-04-23 VITALS — BP 164/94 | HR 91 | Ht 70.0 in | Wt 190.0 lb

## 2013-04-23 DIAGNOSIS — Z79899 Other long term (current) drug therapy: Secondary | ICD-10-CM

## 2013-04-23 DIAGNOSIS — I119 Hypertensive heart disease without heart failure: Secondary | ICD-10-CM

## 2013-04-23 DIAGNOSIS — I4891 Unspecified atrial fibrillation: Secondary | ICD-10-CM

## 2013-04-23 DIAGNOSIS — Z953 Presence of xenogenic heart valve: Secondary | ICD-10-CM

## 2013-04-23 DIAGNOSIS — I359 Nonrheumatic aortic valve disorder, unspecified: Secondary | ICD-10-CM

## 2013-04-23 DIAGNOSIS — Z952 Presence of prosthetic heart valve: Secondary | ICD-10-CM

## 2013-04-23 LAB — BASIC METABOLIC PANEL
BUN: 33 mg/dL — ABNORMAL HIGH (ref 6–23)
CO2: 34 mEq/L — ABNORMAL HIGH (ref 19–32)
Chloride: 89 mEq/L — ABNORMAL LOW (ref 96–112)
Creatinine, Ser: 2 mg/dL — ABNORMAL HIGH (ref 0.4–1.5)
Sodium: 130 mEq/L — ABNORMAL LOW (ref 135–145)

## 2013-04-23 MED ORDER — LOSARTAN POTASSIUM 25 MG PO TABS: 25.0000 mg | ORAL_TABLET | Freq: Every day | ORAL | Status: DC

## 2013-04-23 NOTE — Assessment & Plan Note (Signed)
Blood pressure was remaining stable to slightly elevated.  We are going to resume low-dose losartan 25 mg one daily.  We are checking lab work today to check on renal function.  The patient does have a history of mitral regurgitation and would benefit from afterload reduction with an ARB if his kidneys will tolerate it.

## 2013-04-23 NOTE — Assessment & Plan Note (Signed)
His last echocardiogram was on 06/20/10.  At that time he had normal LV systolic function with ejection fraction of 50-55% and his prosthetic aortic valve was noted to be working well.  Presently he is having symptoms of predominantly right heart failure.  We will update his echocardiogram.

## 2013-04-23 NOTE — Assessment & Plan Note (Signed)
Patient is permanent atrial fibrillation.  He is on Coumadin.  He is not having any TIA symptoms.

## 2013-04-23 NOTE — Patient Instructions (Signed)
RESTART LOSARTAN 25 MG DAILY  Your physician has requested that you have an echocardiogram. Echocardiography is a painless test that uses sound waves to create images of your heart. It provides your doctor with information about the size and shape of your heart and how well your heart's chambers and valves are working. This procedure takes approximately one hour. There are no restrictions for this procedure.  Your physician recommends that you schedule a follow-up appointment in: 2-3 WEEK OV/BMET

## 2013-04-23 NOTE — Progress Notes (Signed)
Evan Perez Date of Birth:  02-09-1923 [address] [place], [place]  [address] [phone]         Fax   [phone]  History of Present Illness: This pleasant 79 year old gentleman who is a resident of [facility] is seen for a scheduled followup visit. He has a past history of previous severe aortic stenosis. An 04/12/10 he underwent aortic valve replacement with a bovine pericardial tissue valve by Dr. Laneta Simmers. His cardiac catheterization preoperatively showed only minimal coronary artery disease and he did not require coronary artery bypass graft surgery. Postoperatively the patient had a lot of problems with fluid retention and congestive heart failure which gradually resolved.  His last echocardiogram 06/20/10 showed mild left ventricular hypertrophy with ejection fraction 50-55% and the bioprosthetic aortic valve was functioning normally. There was biatrial enlargement and there was mild to moderate tricuspid regurgitation. 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  Because of renal insufficiency his losartan was stopped while in the hospital.  Since coming home from the hospital the patient has continued to have a lot of edema.  He is having symptoms of right heart failure.  He has marked lower extremity edema as well as a 21 pound weight gain since last visit.  He has been having abdominal distention.  Current Outpatient Prescriptions  Medication Sig Dispense Refill  . aspirin 81 MG tablet Take 81 mg by mouth every Monday, Wednesday, and Friday.       Marland Kitchen azelastine (ASTELIN) 137 MCG/SPRAY nasal spray Place 1 spray into the nose 2 (two) times daily as needed. Use in each nostril as directed      . betamethasone valerate (VALISONE) 0.1 %  cream Apply 1 application topically daily as needed (rash).      . cloNIDine (CATAPRES) 0.1 MG tablet Take 0.1 mg by mouth 2 (two) times daily.      . diphenhydrAMINE (BENADRYL) 25 MG tablet Take 25 mg by mouth at bedtime.       . docusate sodium (COLACE) 100 MG capsule Take 100 mg by mouth 2 (two) times daily.      . furosemide (LASIX) 40 MG tablet Take 40 mg by mouth as directed. 3 tablets in the morning and 2 after lunch      . guaiFENesin (MUCINEX) 600 MG 12 hr tablet Take 600 mg by mouth 2 (two) times daily as needed for congestion. For congestion.      . hydrALAZINE (APRESOLINE) 10 MG tablet Take 1 tablet (10 mg total) by mouth 3 (three) times daily.  90 tablet  3  . levothyroxine (SYNTHROID, LEVOTHROID) 25 MCG tablet Take 1 tablet (25 mcg total) by mouth daily.  30 tablet  11  . loratadine (CLARITIN) 10 MG tablet Take 10 mg by mouth daily.      . mometasone (NASONEX) 50 MCG/ACT nasal spray Place 2 sprays into the nose daily as needed (for allergies).       Marland Kitchen OVER THE COUNTER MEDICATION Place 1 spray into both nostrils daily as needed (for allergies). neilmed nasal wash      . polyethylene glycol (MIRALAX / GLYCOLAX) packet Take 17 g by mouth as needed.      . potassium chloride (K-DUR) 10 MEQ tablet Take 1 tablet (10 mEq total)  by mouth 2 (two) times daily.  90 tablet  5  . traZODone (DESYREL) 25 mg TABS tablet Take 25 mg by mouth as directed. 2 tablets at bedtime      . warfarin (COUMADIN) 5 MG tablet Take 1 tablet (5 mg total) by mouth as directed.  45 tablet  3  . losartan (COZAAR) 25 MG tablet Take 1 tablet (25 mg total) by mouth daily.  30 tablet  5  . Multiple Vitamins-Iron (MULTIVITAMIN/IRON PO) Take by mouth daily.       No current facility-administered medications for this visit.    Allergies  Allergen Reactions  . Dust Mite Extract   . Lipitor [Atorvastatin Calcium] Other (See Comments)    Nervousness     . Metolazone Nausea Only  . Ramipril Other (See Comments)    Pt  cannot remember reaction   . Zocor [Simvastatin] Other (See Comments)    Nervousness   . Lovenox [Enoxaparin Sodium] Rash    Rash on abdomen, back, lower legs.     Patient Active Problem List   Diagnosis Date Noted  . Status post aortic valve replacement with bioprosthetic valve 01/15/2012    Priority: High  . Perennial allergic rhinitis 03/13/2011    Priority: High  . Drug-induced skin rash 09/12/2010    Priority: Medium  . Benign hypertensive heart disease without heart failure 12/02/2006    Priority: Medium  . Atrial fibrillation 12/02/2006    Priority: Medium  . Edema 04/20/2013  . Retroperitoneal lymphadenopathy 04/13/2013  . Aftercare following surgery of the circulatory system, NEC 02/16/2013  . Peripheral edema 02/11/2013  . Constipation 02/11/2013  . Acute on chronic renal failure 02/01/2013  . Chronic anticoagulation 01/26/2013  . Occlusion and stenosis of carotid artery without mention of cerebral infarction 12/29/2012  . Abdominal aneurysm without mention of rupture 12/15/2012  . AORTIC STENOSIS, SEVERE 11/01/2009  . DYSPNEA 11/01/2009  . BACK PAIN 10/03/2009  . POLYMYALGIA RHEUMATICA 04/01/2009  . ERECTILE DYSFUNCTION, ORGANIC 10/26/2008  . URI 06/24/2008  . GOUT 03/10/2007  . HYPERLIPIDEMIA 12/02/2006  . BENIGN PROSTATIC HYPERTROPHY 12/02/2006    History  Smoking status  . Former Smoker -- 0.75 packs/day for 40 years  . Types: Cigarettes  Smokeless tobacco  . Never Used    Comment: 01/26/2013 "stopped smoking for good in ~ 1985"    History  Alcohol Use  . 1.2 oz/week  . 2 Glasses of wine per week    Family History  Problem Relation Age of Onset  . Hypertension Mother   . Heart disease Mother   . Heart attack Mother   . Hypertension Father   . Heart disease Father   . Heart disease Brother     Review of Systems: Constitutional: no fever chills diaphoresis or fatigue or change in weight.  Head and neck: no hearing loss, no epistaxis, no  photophobia or visual disturbance. Respiratory: No cough, shortness of breath or wheezing. Cardiovascular: No chest pain peripheral edema, palpitations. Gastrointestinal: No abdominal distention, no abdominal pain, no change in bowel habits hematochezia or melena. Genitourinary: No dysuria, no frequency, no urgency, no nocturia. Musculoskeletal:No arthralgias, no back pain, no gait disturbance or myalgias. Neurological: No dizziness, no headaches, no numbness, no seizures, no syncope, no weakness, no tremors. Hematologic: No lymphadenopathy, no easy bruising. Psychiatric: No confusion, no hallucinations, no sleep disturbance.    Physical Exam: Filed Vitals:   04/23/13 1405  BP: 164/94  Pulse: 91   the general appearance reveals a well-developed elderly  gentleman in no acute distress.  .The head and neck exam reveals pupils equal and reactive.  Extraocular movements are full.  There is no scleral icterus.  The mouth and pharynx are normal.  The neck is supple.  The carotids reveal no bruits.  The jugular venous pressure is significantly elevated.  The  thyroid is not enlarged.  There is no lymphadenopathy.  The chest reveals diminished breath sounds at the bases.  Expansion of the chest is symmetrical.  The precordium is quiet.  The first heart sound is normal.  The second heart sound is physiologically split.  There is a grade 2/6 apical systolic murmur consistent with mitral regurgitation. There is no abnormal lift or heave.  The abdomen is markedly distended with ascites.  The bowel sounds are normal.  The liver and spleen are not enlarged.   Extremities reveal 4+ pitting edema up to the groin bilaterally .the surgical incisions do not appear to be infected .There is no cyanosis or clubbing.  Strength is normal and symmetrical in all extremities.  There is no lateralizing weakness.  There are no sensory deficits.  The skin is warm and dry.  There is erythematous changes in his lower extremities  bilaterally secondary to severe edema and stretching of the skin.  He does not appear to have any active cellulitis.    EKG shows atrial fibrillation with controlled ventricular rate of 91 and occasional PVCs.  QRS voltages has decreased since last EKG. Occasional PVCs were noted.  Assessment / Plan: Continue same Lasix.  Resume losartan 25 mg daily.  We are checking blood work today. The patient is to return soon for a two-dimensional echocardiogram to look for evidence of right ventricular dysfunction.  Must also keep in mind possible constrictive pericarditis following open heart surgery.  If echo is not helpful consider right heart catheter.  The patient is on chronic Coumadin so recurrent pulmonary emboli would be unlikely.  Return here in 2-3 weeks for followup office visit and basal metabolic panel

## 2013-04-27 ENCOUNTER — Telehealth: Payer: Self-pay | Admitting: Cardiology

## 2013-04-27 ENCOUNTER — Ambulatory Visit: Payer: Medicare Other | Admitting: Surgery

## 2013-04-27 NOTE — Telephone Encounter (Signed)
New problem    Pt has questions about his LOSARTAN he got it today.   Pt has been taking VALSARTAN for 4 days by mistake pt wants to know it this will cause any complications.

## 2013-04-27 NOTE — Telephone Encounter (Signed)
Follow up    Pt would like his Echo moved up or done some where else sooner please.

## 2013-04-27 NOTE — Telephone Encounter (Signed)
Discussed with patient and will start Losartan tomorrow. Will call echo department at [facility] tomorrow to see if echo can be done sooner.

## 2013-04-28 NOTE — Telephone Encounter (Signed)
Spoke with patient and [facility] and they are booked out for ehcos as well. Advised patient and he will keep scheduled echo

## 2013-05-06 ENCOUNTER — Ambulatory Visit (INDEPENDENT_AMBULATORY_CARE_PROVIDER_SITE_OTHER): Payer: Medicare Other | Admitting: Pharmacist

## 2013-05-06 DIAGNOSIS — Z952 Presence of prosthetic heart valve: Secondary | ICD-10-CM

## 2013-05-06 DIAGNOSIS — Z953 Presence of xenogenic heart valve: Secondary | ICD-10-CM

## 2013-05-06 DIAGNOSIS — I359 Nonrheumatic aortic valve disorder, unspecified: Secondary | ICD-10-CM

## 2013-05-06 DIAGNOSIS — I4891 Unspecified atrial fibrillation: Secondary | ICD-10-CM

## 2013-05-06 LAB — POCT INR: INR: 2.5

## 2013-05-08 ENCOUNTER — Encounter: Payer: Self-pay | Admitting: Cardiology

## 2013-05-08 ENCOUNTER — Other Ambulatory Visit (INDEPENDENT_AMBULATORY_CARE_PROVIDER_SITE_OTHER): Payer: Medicare Other

## 2013-05-08 ENCOUNTER — Ambulatory Visit (HOSPITAL_COMMUNITY): Payer: Medicare Other | Attending: Cardiovascular Disease | Admitting: Radiology

## 2013-05-08 DIAGNOSIS — I4891 Unspecified atrial fibrillation: Secondary | ICD-10-CM

## 2013-05-08 DIAGNOSIS — R609 Edema, unspecified: Secondary | ICD-10-CM

## 2013-05-08 DIAGNOSIS — E785 Hyperlipidemia, unspecified: Secondary | ICD-10-CM | POA: Insufficient documentation

## 2013-05-08 DIAGNOSIS — I714 Abdominal aortic aneurysm, without rupture, unspecified: Secondary | ICD-10-CM | POA: Insufficient documentation

## 2013-05-08 DIAGNOSIS — I059 Rheumatic mitral valve disease, unspecified: Secondary | ICD-10-CM | POA: Insufficient documentation

## 2013-05-08 DIAGNOSIS — Z952 Presence of prosthetic heart valve: Secondary | ICD-10-CM | POA: Insufficient documentation

## 2013-05-08 DIAGNOSIS — I079 Rheumatic tricuspid valve disease, unspecified: Secondary | ICD-10-CM | POA: Insufficient documentation

## 2013-05-08 DIAGNOSIS — I359 Nonrheumatic aortic valve disorder, unspecified: Secondary | ICD-10-CM

## 2013-05-08 DIAGNOSIS — Z79899 Other long term (current) drug therapy: Secondary | ICD-10-CM

## 2013-05-08 DIAGNOSIS — I679 Cerebrovascular disease, unspecified: Secondary | ICD-10-CM | POA: Insufficient documentation

## 2013-05-08 DIAGNOSIS — I509 Heart failure, unspecified: Secondary | ICD-10-CM | POA: Insufficient documentation

## 2013-05-08 LAB — BASIC METABOLIC PANEL
BUN: 40 mg/dL — ABNORMAL HIGH (ref 6–23)
CO2: 33 mEq/L — ABNORMAL HIGH (ref 19–32)
Chloride: 91 mEq/L — ABNORMAL LOW (ref 96–112)
Creatinine, Ser: 2.2 mg/dL — ABNORMAL HIGH (ref 0.4–1.5)
Glucose, Bld: 77 mg/dL (ref 70–99)
Potassium: 3.9 mEq/L (ref 3.5–5.1)

## 2013-05-08 NOTE — Progress Notes (Signed)
Echocardiogram performed.  

## 2013-05-10 NOTE — Progress Notes (Signed)
Quick Note:  Please make copy of labs for patient visit. ______ 

## 2013-05-10 NOTE — Progress Notes (Signed)
Quick Note:  Please make copy of labs and echo for patient visit. ______

## 2013-05-11 ENCOUNTER — Encounter: Payer: Self-pay | Admitting: Internal Medicine

## 2013-05-11 ENCOUNTER — Ambulatory Visit (INDEPENDENT_AMBULATORY_CARE_PROVIDER_SITE_OTHER): Payer: Medicare Other | Admitting: Internal Medicine

## 2013-05-11 VITALS — BP 128/70 | HR 70 | Temp 97.7°F | Resp 20 | Wt 177.0 lb

## 2013-05-11 DIAGNOSIS — I4891 Unspecified atrial fibrillation: Secondary | ICD-10-CM

## 2013-05-11 DIAGNOSIS — Z952 Presence of prosthetic heart valve: Secondary | ICD-10-CM

## 2013-05-11 DIAGNOSIS — Z953 Presence of xenogenic heart valve: Secondary | ICD-10-CM

## 2013-05-11 DIAGNOSIS — I714 Abdominal aortic aneurysm, without rupture: Secondary | ICD-10-CM

## 2013-05-11 DIAGNOSIS — R609 Edema, unspecified: Secondary | ICD-10-CM

## 2013-05-11 NOTE — Progress Notes (Signed)
Pre-visit discussion using our clinic review tool. No additional management support is needed unless otherwise documented below in the visit note.  

## 2013-05-11 NOTE — Progress Notes (Signed)
Subjective:    Patient ID: Evan Perez, male    DOB: 1922-10-26, 79 y.o.   MRN: [id number]  HPI Wt Readings from Last 3 Encounters:  05/11/13 177 lb (80.287 kg)  04/23/13 190 lb (86.183 kg)  04/20/13 190 lb 12.8 oz (86.66 kg)   79 year old patient who is seen today in followup for right-sided heart failure symptoms. He is status post endovascular AAA repair in August which has been complicated by lower extremity edema. He states that when he underwent aortic valve repair in November of 2011 he was hospitalized for an additional 18 days due to fluid overload. Remains on Lasix 120 mg in the morning and 80 mg in the afternoon with potassium supplementation. Electrolytes were checked 3 days ago. He has stable chronic kidney disease. He was seen by cardiology and losartan was added to his regimen. A 2-D echocardiogram revealed normal LV and RV size and function. No pericardial effusion. There was severe TR and dilated RA. IVC was dilated. Abdominal ultrasound was obtained that revealed pleural effusions abdominal ascites but no evidence of venous thrombosis Since his last visit here he has lost 13 pounds in weight but still has considerable scrotal and proximal leg edema  Past Medical History  Diagnosis Date  . History of aortic stenosis     Severe s/p bovine pericardial tissue AVR 04/2010  . Hypertension   . Fall 2011    FRACTURE VERTEBRAL  . CHF (congestive heart failure)     LV DYSFUNCTION -EF 55% JAN 2012  . Permanent atrial fibrillation     On chronic Coumadin anticoagulation  . Cancer   . CAD (coronary artery disease)     Nonobstructive by 2011 cath  . Hyperlipidemia   . Complication of anesthesia     "drowsy for 2 days after OR" (01/26/2013)  . AAA (abdominal aortic aneurysm)     AAA/left iliac aneurysm (3.9 cm)/notes 01/26/2013    History   Social History  . Marital Status: Widowed    Spouse Name: N/A    Number of Children: N/A  . Years of Education: N/A    Occupational History  . Not on file.   Social History Main Topics  . Smoking status: Former Smoker -- 0.75 packs/day for 40 years    Types: Cigarettes  . Smokeless tobacco: Never Used     Comment: 01/26/2013 "stopped smoking for good in ~ 1985"  . Alcohol Use: 1.2 oz/week    2 Glasses of wine per week  . Drug Use: No  . Sexual Activity: Not Currently   Other Topics Concern  . Not on file   Social History Narrative  . No narrative on file    Past Surgical History  Procedure Laterality Date  . Aortic valve replacement      NOV 2011-TISSUE VALVE, 23 mm Mitroflow aortic pericardial heart valve  . Cardiac catheterization      03/2010-non obstructive CAD; severe AS  . Vasectomy    . Tonsillectomy    . Cardiac valve replacement    . Embolization      coil embolization of the left hypogastric artery prior to endovascular repair/notes 01/26/2013  . Cataract extraction w/ intraocular lens  implant, bilateral Bilateral     "right ~ 1999; left ~ 2012" (01/26/2013)  . Abdominal aortic endovascular stent graft N/A 01/29/2013    Procedure: ABDOMINAL AORTIC ENDOVASCULAR STENT GRAFT - GORE; ULTRASOUND GUIDED;  Surgeon: Nada Libman, MD;  Location: [facility] OR;  Service: Vascular;  Laterality: N/A;    Family History  Problem Relation Age of Onset  . Hypertension Mother   . Heart disease Mother   . Heart attack Mother   . Hypertension Father   . Heart disease Father   . Heart disease Brother     Allergies  Allergen Reactions  . Dust Mite Extract   . Lipitor [Atorvastatin Calcium] Other (See Comments)    Nervousness     . Metolazone Nausea Only  . Ramipril Other (See Comments)    Pt cannot remember reaction   . Zocor [Simvastatin] Other (See Comments)    Nervousness   . Lovenox [Enoxaparin Sodium] Rash    Rash on abdomen, back, lower legs.     Current Outpatient Prescriptions on File Prior to Visit  Medication Sig Dispense Refill  . aspirin 81 MG tablet Take 81 mg by mouth  every Monday, Wednesday, and Friday.       Marland Kitchen azelastine (ASTELIN) 137 MCG/SPRAY nasal spray Place 1 spray into the nose 2 (two) times daily as needed. Use in each nostril as directed      . betamethasone valerate (VALISONE) 0.1 % cream Apply 1 application topically daily as needed (rash).      . cloNIDine (CATAPRES) 0.1 MG tablet Take 0.1 mg by mouth 2 (two) times daily.      . diphenhydrAMINE (BENADRYL) 25 MG tablet Take 25 mg by mouth at bedtime.       . docusate sodium (COLACE) 100 MG capsule Take 100 mg by mouth 2 (two) times daily.      . furosemide (LASIX) 40 MG tablet Take 40 mg by mouth as directed. 3 tablets in the morning and 2 after lunch      . guaiFENesin (MUCINEX) 600 MG 12 hr tablet Take 600 mg by mouth 2 (two) times daily as needed for congestion. For congestion.      . hydrALAZINE (APRESOLINE) 10 MG tablet Take 1 tablet (10 mg total) by mouth 3 (three) times daily.  90 tablet  3  . levothyroxine (SYNTHROID, LEVOTHROID) 25 MCG tablet Take 1 tablet (25 mcg total) by mouth daily.  30 tablet  11  . loratadine (CLARITIN) 10 MG tablet Take 10 mg by mouth daily.      Marland Kitchen losartan (COZAAR) 25 MG tablet Take 1 tablet (25 mg total) by mouth daily.  30 tablet  5  . mometasone (NASONEX) 50 MCG/ACT nasal spray Place 2 sprays into the nose daily as needed (for allergies).       . Multiple Vitamins-Iron (MULTIVITAMIN/IRON PO) Take by mouth daily.      Marland Kitchen OVER THE COUNTER MEDICATION Place 1 spray into both nostrils daily as needed (for allergies). neilmed nasal wash      . polyethylene glycol (MIRALAX / GLYCOLAX) packet Take 17 g by mouth as needed.      . potassium chloride (K-DUR) 10 MEQ tablet Take 1 tablet (10 mEq total) by mouth 2 (two) times daily.  90 tablet  5  . traZODone (DESYREL) 25 mg TABS tablet Take 25 mg by mouth as directed. 2 tablets at bedtime      . warfarin (COUMADIN) 5 MG tablet Take 1 tablet (5 mg total) by mouth as directed.  45 tablet  3   No current facility-administered  medications on file prior to visit.    BP 128/70  Pulse 70  Temp(Src) 97.7 F (36.5 C) (Oral)  Resp 20  Wt 177 lb (80.287 kg)  Review of Systems  Constitutional: Negative for fever, chills, appetite change and fatigue.  HENT: Negative for congestion, dental problem, ear pain, hearing loss, sore throat, tinnitus, trouble swallowing and voice change.   Eyes: Negative for pain, discharge and visual disturbance.  Respiratory: Negative for cough, chest tightness, wheezing and stridor.   Cardiovascular: Positive for leg swelling. Negative for chest pain and palpitations.  Gastrointestinal: Negative for nausea, vomiting, abdominal pain, diarrhea, constipation, blood in stool and abdominal distention.  Genitourinary: Positive for scrotal swelling. Negative for urgency, hematuria, flank pain, discharge, difficulty urinating and genital sores.  Musculoskeletal: Positive for gait problem. Negative for arthralgias, back pain, joint swelling, myalgias and neck stiffness.  Skin: Negative for rash.  Neurological: Negative for dizziness, syncope, speech difficulty, weakness, numbness and headaches.  Hematological: Negative for adenopathy. Does not bruise/bleed easily.  Psychiatric/Behavioral: Negative for behavioral problems and dysphoric mood. The patient is not nervous/anxious.        Objective:   Physical Exam  Constitutional: He is oriented to person, place, and time. He appears well-developed.  HENT:  Head: Normocephalic.  Right Ear: External ear normal.  Left Ear: External ear normal.  Eyes: Conjunctivae and EOM are normal.  Neck: Normal range of motion.  Cardiovascular: Normal rate and normal heart sounds.   Pulmonary/Chest: Breath sounds normal.  Abdominal: Bowel sounds are normal.  Musculoskeletal: Normal range of motion. He exhibits edema. He exhibits no tenderness.  Improved scrotal and proximal leg edema  Neurological: He is alert and oriented to person, place, and time.    Psychiatric: He has a normal mood and affect. His behavior is normal.          Assessment & Plan:  Right-sided heart failure. Improved. Continue present regimen. Patient has followup cardiology visit tomorrow Status post endovascular repair of AAA Status post porcine aVR Chronic atrial fibrillation with chronic Coumadin anticoagulation  Recheck here in 6 weeks with lab

## 2013-05-11 NOTE — Patient Instructions (Signed)
Limit your sodium (Salt) intake  Return office visit 6 weeks

## 2013-05-12 ENCOUNTER — Ambulatory Visit (INDEPENDENT_AMBULATORY_CARE_PROVIDER_SITE_OTHER): Payer: Medicare Other | Admitting: Cardiology

## 2013-05-12 ENCOUNTER — Encounter: Payer: Self-pay | Admitting: Cardiology

## 2013-05-12 VITALS — BP 125/76 | HR 88 | Ht 70.0 in | Wt 177.0 lb

## 2013-05-12 DIAGNOSIS — I4891 Unspecified atrial fibrillation: Secondary | ICD-10-CM

## 2013-05-12 DIAGNOSIS — Z7901 Long term (current) use of anticoagulants: Secondary | ICD-10-CM

## 2013-05-12 DIAGNOSIS — Z953 Presence of xenogenic heart valve: Secondary | ICD-10-CM

## 2013-05-12 DIAGNOSIS — R609 Edema, unspecified: Secondary | ICD-10-CM

## 2013-05-12 DIAGNOSIS — Z952 Presence of prosthetic heart valve: Secondary | ICD-10-CM

## 2013-05-12 DIAGNOSIS — N189 Chronic kidney disease, unspecified: Secondary | ICD-10-CM

## 2013-05-12 DIAGNOSIS — N179 Acute kidney failure, unspecified: Secondary | ICD-10-CM

## 2013-05-12 NOTE — Progress Notes (Signed)
Evan Perez Date of Birth:  [birth year] [address] [place], [place]  [address] [phone]         Fax   [phone]  History of Present Illness: This pleasant 79 year old gentleman who is a resident of [facility] is seen for a scheduled followup visit. He has a past history of previous severe aortic stenosis. An 04/12/10 he underwent aortic valve replacement with a bovine pericardial tissue valve by Dr. Laneta Simmers. His cardiac catheterization preoperatively showed only minimal coronary artery disease and he did not require coronary artery bypass graft surgery. Postoperatively the patient had a lot of problems with fluid retention and congestive heart failure which gradually resolved.  His last echocardiogram 06/20/10 showed mild left ventricular hypertrophy with ejection fraction 50-55% and the bioprosthetic aortic valve was functioning normally. There was biatrial enlargement and there was mild to moderate tricuspid regurgitation. 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  Because of renal insufficiency his losartan was stopped while in the hospital.  Since coming home from the hospital the patient has continued to have a lot of edema.  He is having symptoms of right heart failure.  He has marked lower extremity edema as well as a 21 pound weight gain since last visit.  He has been having abdominal distention. Since last visit when his diuretics were increased he has lost 13 pounds.  He has mobilized some of the fluid out of his legs and to a lesser extent out of his scrotum area.  He is not having any significant exertional dyspnea. The patient had a updated two-dimensional echocardiogram on 05/08/13 which showed an ejection fraction of 50-55%.   There was normal bioprosthetic aortic valve function.  There was mild mitral regurgitation.  There was moderate to severe tricuspid regurgitation.  There was biatrial enlargement.  Current Outpatient Prescriptions  Medication Sig Dispense Refill  . aspirin 81 MG tablet Take 81 mg by mouth every Monday, Wednesday, and Friday.       Marland Kitchen azelastine (ASTELIN) 137 MCG/SPRAY nasal spray Place 1 spray into the nose 2 (two) times daily as needed. Use in each nostril as directed      . betamethasone valerate (VALISONE) 0.1 % cream Apply 1 application topically daily as needed (rash).      . cloNIDine (CATAPRES) 0.1 MG tablet Take 0.1 mg by mouth 2 (two) times daily.      . diphenhydrAMINE (BENADRYL) 25 MG tablet Take 25 mg by mouth at bedtime.       . docusate sodium (COLACE) 100 MG capsule Take 100 mg by mouth 2 (two) times daily.      Marland Kitchen doxycycline (VIBRA-TABS) 100 MG tablet       . furosemide (LASIX) 40 MG tablet Take 40 mg by mouth as directed. 3 tablets in the morning and 2 after lunch      . guaiFENesin (MUCINEX) 600 MG 12 hr tablet Take 600 mg by mouth 2 (two) times daily as needed for congestion. For congestion.      . hydrALAZINE (APRESOLINE) 10 MG tablet Take 1 tablet (10 mg total) by mouth 3 (three) times daily.  90 tablet  3  . levothyroxine (SYNTHROID, LEVOTHROID) 25 MCG tablet Take 1 tablet (25 mcg total) by mouth daily.  30 tablet  11  . loratadine (CLARITIN) 10 MG tablet Take 10 mg by mouth daily.      Marland Kitchen losartan (COZAAR) 25 MG tablet Take 1 tablet (25 mg total) by mouth daily.  30 tablet  5  . mometasone (NASONEX) 50 MCG/ACT nasal spray Place 2 sprays into the nose daily as needed (for allergies).       . Multiple Vitamins-Iron (MULTIVITAMIN/IRON PO) Take by mouth daily.      Marland Kitchen OVER THE COUNTER MEDICATION Place 1 spray into both nostrils daily as needed (for allergies). neilmed nasal wash      . polyethylene glycol (MIRALAX / GLYCOLAX) packet Take 17 g by mouth as needed.      . potassium  chloride (K-DUR) 10 MEQ tablet Take 1 tablet (10 mEq total) by mouth 2 (two) times daily.  90 tablet  5  . traZODone (DESYREL) 25 mg TABS tablet Take 25 mg by mouth as directed. 2 tablets at bedtime      . warfarin (COUMADIN) 5 MG tablet Take 1 tablet (5 mg total) by mouth as directed.  45 tablet  3   No current facility-administered medications for this visit.    Allergies  Allergen Reactions  . Dust Mite Extract   . Lipitor [Atorvastatin Calcium] Other (See Comments)    Nervousness     . Metolazone Nausea Only  . Ramipril Other (See Comments)    Pt cannot remember reaction   . Zocor [Simvastatin] Other (See Comments)    Nervousness   . Lovenox [Enoxaparin Sodium] Rash    Rash on abdomen, back, lower legs.     Patient Active Problem List   Diagnosis Date Noted  . Status post aortic valve replacement with bioprosthetic valve 01/15/2012    Priority: High  . Perennial allergic rhinitis 03/13/2011    Priority: High  . Drug-induced skin rash 09/12/2010    Priority: Medium  . Benign hypertensive heart disease without heart failure 12/02/2006    Priority: Medium  . Atrial fibrillation 12/02/2006    Priority: Medium  . Edema 04/20/2013  . Retroperitoneal lymphadenopathy 04/13/2013  . Aftercare following surgery of the circulatory system, NEC 02/16/2013  . Peripheral edema 02/11/2013  . Constipation 02/11/2013  . Acute on chronic renal failure 02/01/2013  . Chronic anticoagulation 01/26/2013  . Occlusion and stenosis of carotid artery without mention of cerebral infarction 12/29/2012  . Abdominal aneurysm without mention of rupture 12/15/2012  . AORTIC STENOSIS, SEVERE 11/01/2009  . DYSPNEA 11/01/2009  . BACK PAIN 10/03/2009  . POLYMYALGIA RHEUMATICA 04/01/2009  . ERECTILE DYSFUNCTION, ORGANIC 10/26/2008  . URI 06/24/2008  . GOUT 03/10/2007  . HYPERLIPIDEMIA 12/02/2006  . BENIGN PROSTATIC HYPERTROPHY 12/02/2006    History  Smoking status  . Former Smoker -- 0.75  packs/day for 40 years  . Types: Cigarettes  Smokeless tobacco  . Never Used    Comment: 01/26/2013 "stopped smoking for good in ~ 1985"    History  Alcohol Use  . 1.2 oz/week  . 2 Glasses of wine per week    Family History  Problem Relation Age of Onset  . Hypertension Mother   . Heart disease Mother   . Heart attack Mother   . Hypertension Father   . Heart disease Father   . Heart disease Brother     Review of Systems: Constitutional: no fever chills diaphoresis or fatigue or change in weight.  Head and neck: no hearing loss, no epistaxis, no photophobia or visual disturbance. Respiratory: No cough, shortness of  breath or wheezing. Cardiovascular: No chest pain peripheral edema, palpitations. Gastrointestinal: No abdominal distention, no abdominal pain, no change in bowel habits hematochezia or melena. Genitourinary: No dysuria, no frequency, no urgency, no nocturia. Musculoskeletal:No arthralgias, no back pain, no gait disturbance or myalgias. Neurological: No dizziness, no headaches, no numbness, no seizures, no syncope, no weakness, no tremors. Hematologic: No lymphadenopathy, no easy bruising. Psychiatric: No confusion, no hallucinations, no sleep disturbance.    Physical Exam: Filed Vitals:   05/12/13 1318  BP: 125/76  Pulse: 88   the general appearance reveals a well-developed elderly gentleman in no acute distress.  .The head and neck exam reveals pupils equal and reactive.  Extraocular movements are full.  There is no scleral icterus.  The mouth and pharynx are normal.  The neck is supple.  The carotids reveal no bruits.  The jugular venous pressure is significantly elevated.  The  thyroid is not enlarged.  There is no lymphadenopathy.  The chest reveals diminished breath sounds at the bases.  Expansion of the chest is symmetrical.  The precordium is quiet.  The first heart sound is normal.  The second heart sound is physiologically split.  There is a grade 2/6  apical systolic murmur consistent with mitral regurgitation. There is no abnormal lift or heave.  The abdomen is markedly distended with ascites.  The bowel sounds are normal.  The liver and spleen are not enlarged.   Extremities reveal 4+ pitting edema up to the groin bilaterally .the surgical incisions do not appear to be infected .There is no cyanosis or clubbing.  Strength is normal and symmetrical in all extremities.  There is no lateralizing weakness.  There are no sensory deficits.  The skin is warm and dry.  There is erythematous changes in his lower extremities bilaterally secondary to severe edema and stretching of the skin.  He does not appear to have any active cellulitis.      Assessment / Plan: The patient is to continue current dose of Lasix which is 120 mg in the morning and 80 mg in the afternoon. I have encouraged him to drink more water and this may help his BUN and creatinine.  Recheck in one month for followup office visit and get basal metabolic panel hepatic function panel and CBC ahead of time

## 2013-05-12 NOTE — Assessment & Plan Note (Signed)
The patient is on long-term Coumadin for his permanent atrial fibrillation.  He has not been having any TIA or stroke symptoms.

## 2013-05-12 NOTE — Patient Instructions (Signed)
Your physician recommends that you continue on your current medications as directed. Please refer to the Current Medication list given to you today.  Your physician recommends that you schedule a follow-up appointment in: 1 month ov Labs need to be done a few days before you office visit (bmet/cbchfp)  INCREASE YOUR WATER INTAKE

## 2013-05-12 NOTE — Assessment & Plan Note (Signed)
The patient has residual 1+ peripheral edema of his lower extremities.  He does have several small ulcerations in the physician at [facility] recently put him on doxycycline and a probiotic.

## 2013-05-12 NOTE — Assessment & Plan Note (Signed)
Echocardiogram on 05/08/13 shows normal aortic valve bioprosthetic valve function with no aortic stenosis or insufficiency.

## 2013-05-12 NOTE — Assessment & Plan Note (Signed)
The patient has moderate renal insufficiency.  His most recent serum creatinine is 2.2.  Because of his renal insufficiency we are unable to push his diuretics any higher.  Fortunately, the patient is diuresing on his present regimen.

## 2013-05-15 ENCOUNTER — Telehealth: Payer: Self-pay | Admitting: Internal Medicine

## 2013-05-15 NOTE — Telephone Encounter (Signed)
Message left on voicemail after office had closed on 05/14/13.  Pt requesting refill of potassium chloride (K-DUR) 10 MEQ tablet,  states his dosage was increased and he needs a new script to reflect the dosage change so that his insurance will pay for it.  Pharmacy is [facility]

## 2013-05-16 MED ORDER — POTASSIUM CHLORIDE ER 10 MEQ PO TBCR: 10.0000 meq | EXTENDED_RELEASE_TABLET | Freq: Two times a day (BID) | ORAL | Status: DC

## 2013-05-16 MED ORDER — POTASSIUM CHLORIDE ER 10 MEQ PO TBCR: 20.0000 meq | EXTENDED_RELEASE_TABLET | Freq: Two times a day (BID) | ORAL | Status: DC

## 2013-05-16 NOTE — Telephone Encounter (Signed)
Spoke to pt told him Rx sent for Potassium 1 tablet twice a day. Pt stated no I am taking two tablets twice a day. Told pt okay will resend Rx with correct amount. Pt verbalized understanding. Rx sent.

## 2013-05-25 ENCOUNTER — Telehealth: Payer: Self-pay | Admitting: Internal Medicine

## 2013-05-25 NOTE — Telephone Encounter (Signed)
[facility] reports pt states he is now taking 3 Furosemind 40mg  tabs in the am and 2 in the pm.  They are requesting a new rx for the increase. (Last filled 04/13/13)

## 2013-05-26 ENCOUNTER — Telehealth: Payer: Self-pay | Admitting: Internal Medicine

## 2013-05-26 MED ORDER — POTASSIUM CHLORIDE ER 10 MEQ PO TBCR: EXTENDED_RELEASE_TABLET | ORAL | Status: DC

## 2013-05-26 NOTE — Telephone Encounter (Signed)
New Rx sent.

## 2013-05-26 NOTE — Telephone Encounter (Signed)
Pt needs refill of furosemide (LASIX) 40 MG tablet  (3 in am and 2 after lunch) Pt was instructed to increased this med, and now is out, needs asap!! Pharm: [facility]

## 2013-05-26 NOTE — Telephone Encounter (Signed)
Already sent to pharmacy with new directions

## 2013-05-27 ENCOUNTER — Telehealth: Payer: Self-pay | Admitting: Internal Medicine

## 2013-05-27 NOTE — Telephone Encounter (Signed)
Brycin/Patient [phone] called regarding furosemide refill.  Reported MD increased dose to Furosemide 40 mg, 3 po every morning and 2 po after lunch.  Last filled 04/13/13.  Reports pharmacy only has old Rx with instrutions 2 tabs po every morning. RN called Everett/RPh at [facility] [phone] who advised the only Rx refill received 05/26/13 was for Potassium.  He did not have the current Rx with new orders for 5 furosemide daily.  RN called Kathryn/office to discuss situation and she agreed RN can provide pharmcist with order per Epic.   Since there is no quantity specified, RN ordered enough for 30 day supply only.  Furosemide 40 mg Dispense #150 tablets; sig 3 po every morning and 2 after lunch, no refills.  Rx called to [facility] at [facility].  Left message advised patient Rx refill has been ordered and pharmacy closes at 1500.

## 2013-06-02 NOTE — Telephone Encounter (Signed)
Patient Information:  Caller Name: Herbert Seta  Phone: [phone]  Patient: Evan Perez, Evan Perez  Gender: Male  DOB: 12/16/1922  Age: 79 Years  PCP: Eleonore Chiquito (Family Practice > 57yrs old)  Office Follow Up:  Does the office need to follow up with this patient?: No  Instructions For The Office: N/A  RN Note:  Nurse at independent living is calling as pt has a palm sized area of skin breakdown to his R calf and blistering underneath. No red lines or oozing.  Symptoms  Reason For Call & Symptoms: Skin breakdown, edema  Reviewed Health History In EMR: Yes  Reviewed Medications In EMR: Yes  Reviewed Allergies In EMR: Yes  Reviewed Surgeries / Procedures: Yes  Date of Onset of Symptoms: 05/19/2013  Guideline(s) Used:  No Protocol Available - Sick Adult  Disposition Per Guideline:   See Today or Tomorrow in Office  Reason For Disposition Reached:   Nursing judgment  Advice Given:  Call Back If:  New symptoms develop  You become worse.  Patient Will Follow Care Advice:  YES  Appointment Scheduled:  06/03/2013 09:30:00 Appointment Scheduled Provider:  Other

## 2013-06-03 ENCOUNTER — Encounter: Payer: Self-pay | Admitting: Cardiology

## 2013-06-03 ENCOUNTER — Encounter: Payer: Self-pay | Admitting: Family Medicine

## 2013-06-03 ENCOUNTER — Ambulatory Visit (INDEPENDENT_AMBULATORY_CARE_PROVIDER_SITE_OTHER): Payer: Medicare Other | Admitting: Family Medicine

## 2013-06-03 ENCOUNTER — Ambulatory Visit (INDEPENDENT_AMBULATORY_CARE_PROVIDER_SITE_OTHER): Payer: Medicare Other | Admitting: Internal Medicine

## 2013-06-03 VITALS — BP 152/81 | HR 97 | Temp 98.3°F | Resp 18 | Ht 69.0 in | Wt 173.0 lb

## 2013-06-03 DIAGNOSIS — I359 Nonrheumatic aortic valve disorder, unspecified: Secondary | ICD-10-CM

## 2013-06-03 DIAGNOSIS — I8310 Varicose veins of unspecified lower extremity with inflammation: Secondary | ICD-10-CM

## 2013-06-03 DIAGNOSIS — I83009 Varicose veins of unspecified lower extremity with ulcer of unspecified site: Secondary | ICD-10-CM

## 2013-06-03 DIAGNOSIS — Z952 Presence of prosthetic heart valve: Secondary | ICD-10-CM

## 2013-06-03 DIAGNOSIS — I872 Venous insufficiency (chronic) (peripheral): Secondary | ICD-10-CM

## 2013-06-03 DIAGNOSIS — M545 Low back pain: Secondary | ICD-10-CM

## 2013-06-03 DIAGNOSIS — IMO0001 Reserved for inherently not codable concepts without codable children: Secondary | ICD-10-CM

## 2013-06-03 DIAGNOSIS — T148XXA Other injury of unspecified body region, initial encounter: Secondary | ICD-10-CM

## 2013-06-03 DIAGNOSIS — I4891 Unspecified atrial fibrillation: Secondary | ICD-10-CM

## 2013-06-03 DIAGNOSIS — Z953 Presence of xenogenic heart valve: Secondary | ICD-10-CM

## 2013-06-03 LAB — POCT INR: INR: 1.8

## 2013-06-03 MED ORDER — MUPIROCIN 2 % EX OINT: TOPICAL_OINTMENT | CUTANEOUS | Status: DC

## 2013-06-03 MED ORDER — CEPHALEXIN 500 MG PO CAPS: ORAL_CAPSULE | ORAL | Status: DC

## 2013-06-03 MED ORDER — BETAMETHASONE VALERATE 0.1 % EX CREA: TOPICAL_CREAM | CUTANEOUS | Status: DC

## 2013-06-03 NOTE — Progress Notes (Signed)
Pre visit review using our clinic review tool, if applicable. No additional management support is needed unless otherwise documented below in the visit note. 

## 2013-06-03 NOTE — Progress Notes (Signed)
OFFICE NOTE  06/03/2013  CC:  Chief Complaint  Patient presents with  . Ankle Pain    blister formed on ankle x 2 months and burst  . Back Pain    low back pain     HPI: Patient is a 79 y.o. Caucasian male who is here for blister on right ankle and also low back pain. Has had a few months of bilat LE edema, blister formation and then it burst, has had poorly healing wound in medial left ankle since that time.  No fever.  No malaise.  Was given a course of abx (?doxy) "a few weeks ago" per pt, and he didn't appreciate much change in lower legs.  He is on high dose diuretics, has hx of CRI stage III.  Onset of low back pain 1 week ago.  Sleeps in reclining chair x last 4 mo and hasn't had back pain until last week.  Hurts where he says he had a L1 vertebral fracture (s/p fall from chair) in the past. The pain extends into gluteal area.  No radicular pain down legs, no paresthesias, no leg weakness. No abd pain.  Pertinent PMH:  Past Medical History  Diagnosis Date  . History of aortic stenosis     Severe s/p bovine pericardial tissue AVR 04/2010  . Hypertension   . Fall 2011    FRACTURE VERTEBRAL  . CHF (congestive heart failure)     LV DYSFUNCTION -EF 55% JAN 2012  . Permanent atrial fibrillation     On chronic Coumadin anticoagulation  . Cancer   . CAD (coronary artery disease)     Nonobstructive by 2011 cath  . Hyperlipidemia   . Complication of anesthesia     "drowsy for 2 days after OR" (01/26/2013)  . AAA (abdominal aortic aneurysm)     AAA/left iliac aneurysm (3.9 cm)/notes 01/26/2013   Past Surgical History  Procedure Laterality Date  . Aortic valve replacement      NOV 2011-TISSUE VALVE, 23 mm Mitroflow aortic pericardial heart valve  . Cardiac catheterization      03/2010-non obstructive CAD; severe AS  . Vasectomy    . Tonsillectomy    . Cardiac valve replacement    . Embolization      coil embolization of the left hypogastric artery prior to endovascular  repair/notes 01/26/2013  . Cataract extraction w/ intraocular lens  implant, bilateral Bilateral     "right ~ 1999; left ~ 2012" (01/26/2013)  . Abdominal aortic endovascular stent graft N/A 01/29/2013    Procedure: ABDOMINAL AORTIC ENDOVASCULAR STENT GRAFT - GORE; ULTRASOUND GUIDED;  Surgeon: Nada Libman, MD;  Location: [facility] OR;  Service: Vascular;  Laterality: N/A;    MEDS:  Outpatient Prescriptions Prior to Visit  Medication Sig Dispense Refill  . aspirin 81 MG tablet Take 81 mg by mouth every Monday, Wednesday, and Friday.       Marland Kitchen azelastine (ASTELIN) 137 MCG/SPRAY nasal spray Place 1 spray into the nose 2 (two) times daily as needed. Use in each nostril as directed      . betamethasone valerate (VALISONE) 0.1 % cream Apply 1 application topically daily as needed (rash).      . cloNIDine (CATAPRES) 0.1 MG tablet Take 0.1 mg by mouth 2 (two) times daily.      . diphenhydrAMINE (BENADRYL) 25 MG tablet Take 25 mg by mouth at bedtime.       . docusate sodium (COLACE) 100 MG capsule Take 100 mg by mouth 2 (  two) times daily.      . furosemide (LASIX) 40 MG tablet Take 40 mg by mouth as directed. 3 tablets in the morning and 2 after lunch      . guaiFENesin (MUCINEX) 600 MG 12 hr tablet Take 600 mg by mouth 2 (two) times daily as needed for congestion. For congestion.      . hydrALAZINE (APRESOLINE) 10 MG tablet Take 1 tablet (10 mg total) by mouth 3 (three) times daily.  90 tablet  3  . levothyroxine (SYNTHROID, LEVOTHROID) 25 MCG tablet Take 1 tablet (25 mcg total) by mouth daily.  30 tablet  11  . loratadine (CLARITIN) 10 MG tablet Take 10 mg by mouth daily.      Marland Kitchen losartan (COZAAR) 25 MG tablet Take 1 tablet (25 mg total) by mouth daily.  30 tablet  5  . mometasone (NASONEX) 50 MCG/ACT nasal spray Place 2 sprays into the nose daily as needed (for allergies).       . Multiple Vitamins-Iron (MULTIVITAMIN/IRON PO) Take by mouth daily.      Marland Kitchen OVER THE COUNTER MEDICATION Place 1 spray into both  nostrils daily as needed (for allergies). neilmed nasal wash      . polyethylene glycol (MIRALAX / GLYCOLAX) packet Take 17 g by mouth as needed.      . potassium chloride (K-DUR) 10 MEQ tablet 3 tabs in the am and 2 tabs in the pm  360 tablet  3  . traZODone (DESYREL) 25 mg TABS tablet Take 25 mg by mouth as directed. 2 tablets at bedtime      . warfarin (COUMADIN) 5 MG tablet Take 1 tablet (5 mg total) by mouth as directed.  45 tablet  3  . doxycycline (VIBRA-TABS) 100 MG tablet        No facility-administered medications prior to visit.    PE: Blood pressure 152/81, pulse 97, temperature 98.3 F (36.8 C), temperature source Temporal, resp. rate 18, height 5\' 9"  (1.753 m), weight 173 lb (78.472 kg), SpO2 98.00%. Gen: Alert, well appearing.  Patient is oriented to person, place, time, and situation. AFFECT: pleasant, lucid thought and speech. Back: mild focal TTP at left SI joint but otherwise nontender.  ROM impaired in forward flexion at L spine (limited to 30 deg) due to pain in LB.  All other ROM just a bit limited but not painful. SLR neg bilat.   Abd: soft, NT/ND EXT: no cyanosis or clubbing. He has 2+ pitting edema from knees down into feet, deep pink hue to the skin of anterior lower legs, hyperkeratotic changes diffusely, with a 5 cm x 2 cm oval superficial ulceration of the skin of the medial right ankle.  The border of the ulcer has a large area of thick/hyperkeratotic plaque.  No foul odor.  His lower legs have mild tenderness to palpation diffusely, bilaterally. DP pulses are trace bilat.  Cannot feel PT pulses on either side.  Cap refill on toes is 1 sec. Sensation intact in lower legs.  IMPRESSION AND PLAN:  1) Chronic LE venous stasis dermatitis with right ankle open wound for about 2 mo now. Will set up wound care eval, start bactroban ointment to ulcerated area bid x 10d, start keflex 500mg  bid x 10d, and have him restart his previously prescribed steroid cream to LE  venous stasis dermatitis (not on area of open wound).  2) Acute Lumbosacral LBP--musculoskeletal. Watchful waiting, heating pad, use swimming pool at his living facility.   Spent 25  min with pt today, with >50% of this time spent in counseling and care coordination regarding the above problems.  An After Visit Summary was printed and given to the patient.   FOLLOW UP: prn (he has multiple f/u appt's set for next month with his vascular specialist, his cardiologist, and his PCP).

## 2013-06-05 ENCOUNTER — Encounter: Payer: Self-pay | Admitting: Surgery

## 2013-06-08 ENCOUNTER — Ambulatory Visit (HOSPITAL_COMMUNITY)
Admission: RE | Admit: 2013-06-08 | Discharge: 2013-06-08 | Disposition: A | Discharge status: Home / Self Care | Payer: Medicare Other | Source: Ambulatory Visit | Attending: Surgery | Admitting: Surgery

## 2013-06-08 ENCOUNTER — Ambulatory Visit (INDEPENDENT_AMBULATORY_CARE_PROVIDER_SITE_OTHER): Payer: Medicare Other | Admitting: Surgery

## 2013-06-08 ENCOUNTER — Encounter: Payer: Self-pay | Admitting: Surgery

## 2013-06-08 VITALS — BP 169/89 | HR 77 | Ht 69.0 in | Wt 165.0 lb

## 2013-06-08 DIAGNOSIS — I714 Abdominal aortic aneurysm, without rupture, unspecified: Secondary | ICD-10-CM

## 2013-06-08 DIAGNOSIS — R609 Edema, unspecified: Secondary | ICD-10-CM

## 2013-06-08 DIAGNOSIS — Z48812 Encounter for surgical aftercare following surgery on the circulatory system: Secondary | ICD-10-CM

## 2013-06-08 NOTE — Addendum Note (Signed)
Addended by: Mena Goes on: 06/08/2013 11:59 AM   Modules accepted: Orders

## 2013-06-08 NOTE — Progress Notes (Signed)
Patient name: Evan Perez MRN: [id number] DOB: [birth year] Sex: male     Chief Complaint  Patient presents with  . Re-evaluation    3 month f/u - c/o abdominal, bilateal LE and scrotal swelling     HISTORY OF PRESENT ILLNESS: The patient comes in today for followup of his aneurysm repair.  On 01/06/2013 he underwent coil embolization of his left hypogastric artery.  He was placed with Lovenox but developed a total body rash.  This was secondary to Lovenox.  He was given several weeks to recover and then ultimately underwent endovascular aneurysm repair on 01/29/2013.  He has had significant scrotal, abdominal, and lower extremity edema since his operation.  He is on high-dose Lasix.  I have imaged his common femoral veins which showed pulsatility, suggesting a cardiac source.  He has undergone a cardiac workup including echocardiogram which was essentially normal.  He recently had a blister open on his right leg.  He was referred to the wound center but has not yet been evaluated.  He is on antibiotics for this.  Past Medical History  Diagnosis Date  . History of aortic stenosis     Severe s/p bovine pericardial tissue AVR 04/2010  . Hypertension   . Fall 2011    FRACTURE VERTEBRAL  . CHF (congestive heart failure)     LV DYSFUNCTION -EF 55% JAN 2012  . Permanent atrial fibrillation     On chronic Coumadin anticoagulation  . Cancer   . CAD (coronary artery disease)     Nonobstructive by 2011 cath  . Hyperlipidemia   . Complication of anesthesia     "drowsy for 2 days after OR" (01/26/2013)  . 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 Aortic valve replacement      NOV 2011-TISSUE VALVE, 23 mm Mitroflow aortic pericardial heart valve  . Cardiac catheterization      03/2010-non obstructive CAD; severe AS  . Vasectomy    . Tonsillectomy    . Cardiac valve replacement    .  Embolization      coil embolization of the left hypogastric artery prior to endovascular repair/notes 01/26/2013  . Cataract extraction w/ intraocular lens  implant, bilateral Bilateral     "right ~ 1999; left ~ 2012" (01/26/2013)  . Abdominal aortic endovascular stent graft N/A 01/29/2013    Procedure: ABDOMINAL AORTIC ENDOVASCULAR STENT GRAFT - GORE; ULTRASOUND GUIDED;  Surgeon: Serafina Mitchell, MD;  Location: [facility] OR;  Service: Vascular;  Laterality: N/A;    History   Social History  . Marital Status: Widowed    Spouse Name: N/A    Number of Children: N/A  . Years of Education: N/A   Occupational History  . Not on file.   Social History Main Topics  . Smoking status: Former Smoker -- 0.75 packs/day for 40 years    Types: Cigarettes  . Smokeless tobacco: Never Used     Comment: 01/26/2013 "stopped smoking for good in ~ 1985"  . Alcohol Use: 1.2 oz/week    2 Glasses of wine per week  . Drug Use: No  . Sexual Activity: Not Currently   Other Topics Concern  . Not on file   Social History Narrative  . No narrative on file    Family History  Problem Relation Age of Onset  . Hypertension Mother   . Heart disease Mother   .  Heart attack Mother   . Hypertension Father   . Heart disease Father   . Heart disease Brother     Allergies as of 06/08/2013 - Review Complete 06/08/2013  Allergen Reaction Noted  . Dust mite extract  05/14/2012  . Lipitor [atorvastatin calcium] Other (See Comments) 09/12/2010  . Metolazone Nausea Only 02/17/2013  . Ramipril Other (See Comments) 09/12/2010  . Zocor [simvastatin] Other (See Comments) 09/12/2010  . Lovenox [enoxaparin sodium] Rash 01/05/2013    Current Outpatient Prescriptions on File Prior to Visit  Medication Sig Dispense Refill  . aspirin 81 MG tablet Take 81 mg by mouth every Monday, Wednesday, and Friday.       Marland Kitchen azelastine (ASTELIN) 137 MCG/SPRAY nasal spray Place 1 spray into the nose 2 (two) times daily as needed. Use in each  nostril as directed      . betamethasone valerate (VALISONE) 0.1 % cream Apply to rash on lower legs daily (do not apply to open wound)  45 g  1  . cephALEXin (KEFLEX) 500 MG capsule 1 tab po bid x 10d  20 capsule  0  . cloNIDine (CATAPRES) 0.1 MG tablet Take 0.1 mg by mouth 2 (two) times daily.      . diphenhydrAMINE (BENADRYL) 25 MG tablet Take 25 mg by mouth at bedtime.       . docusate sodium (COLACE) 100 MG capsule Take 100 mg by mouth 2 (two) times daily.      . furosemide (LASIX) 40 MG tablet Take 40 mg by mouth as directed. 3 tablets in the morning and 2 after lunch      . guaiFENesin (MUCINEX) 600 MG 12 hr tablet Take 600 mg by mouth 2 (two) times daily as needed for congestion. For congestion.      . hydrALAZINE (APRESOLINE) 10 MG tablet Take 1 tablet (10 mg total) by mouth 3 (three) times daily.  90 tablet  3  . levothyroxine (SYNTHROID, LEVOTHROID) 25 MCG tablet Take 1 tablet (25 mcg total) by mouth daily.  30 tablet  11  . loratadine (CLARITIN) 10 MG tablet Take 10 mg by mouth daily.      Marland Kitchen losartan (COZAAR) 25 MG tablet Take 1 tablet (25 mg total) by mouth daily.  30 tablet  5  . mometasone (NASONEX) 50 MCG/ACT nasal spray Place 2 sprays into the nose daily as needed (for allergies).       . Multiple Vitamins-Iron (MULTIVITAMIN/IRON PO) Take by mouth daily.      . mupirocin ointment (BACTROBAN) 2 % Apply to affected area of right ankle bid x 10d  30 g  0  . OVER THE COUNTER MEDICATION Place 1 spray into both nostrils daily as needed (for allergies). neilmed nasal wash      . polyethylene glycol (MIRALAX / GLYCOLAX) packet Take 17 g by mouth as needed.      . potassium chloride (K-DUR) 10 MEQ tablet 3 tabs in the am and 2 tabs in the pm  360 tablet  3  . traZODone (DESYREL) 25 mg TABS tablet Take 25 mg by mouth as directed. 2 tablets at bedtime      . warfarin (COUMADIN) 5 MG tablet Take 1 tablet (5 mg total) by mouth as directed.  45 tablet  3   No current facility-administered  medications on file prior to visit.     REVIEW OF SYSTEMS: Cardiovascular: Positive for swelling and right venous stasis ulcer Pulmonary: No productive cough, asthma or wheezing. Neurologic: No  weakness, paresthesias, aphasia, or amaurosis. No dizziness. Hematologic: No bleeding problems or clotting disorders. Musculoskeletal: No joint pain or joint swelling. Gastrointestinal: No blood in stool or hematemesis Genitourinary: No dysuria or hematuria. Psychiatric:: No history of major depression. Integumentary: No rashes or ulcers. Constitutional: No fever or chills.  PHYSICAL EXAMINATION:   Vital signs are BP 169/89  Pulse 77  Ht 5\' 9"  (1.753 m)  Wt 165 lb (74.844 kg)  BMI 24.36 kg/m2  SpO2 96% General: The patient appears their stated age. HEENT:  No gross abnormalities Pulmonary:  Non labored breathing Abdomen: Soft and non-tender Musculoskeletal: There are no major deformities. Neurologic: No focal weakness or paresthesias are detected, Skin: 5 x 5 cm venous stasis ulcer on the right with surrounding erythema. Psychiatric: The patient has normal affect. Cardiovascular: There is a regular rate and rhythm without significant bilateral lower extremity, scrotal and abdominal edema murmur appreciated.   Diagnostic Studies I have reviewed his ultrasound images.  This shows a patent endovascular stent graft with no evidence of endoleak.  Maximum aortic aneurysm 3.7.  Maximum iliac diameter is 1.3  Assessment: Status post address aneurysm repair Plan: I am placing the right leg and a boot, to be changed 3 times a week by home health until he sees the wound center.  With regards to his aneurysm, I will get a noncontrast CT scan in 3 months.  His creatinine was 2.2 today  V. Leia Alf, M.D. [facility] Office: [phone] Pager:  [phone]

## 2013-06-11 ENCOUNTER — Emergency Department (HOSPITAL_BASED_OUTPATIENT_CLINIC_OR_DEPARTMENT_OTHER): Payer: Medicare Other

## 2013-06-11 ENCOUNTER — Emergency Department (HOSPITAL_BASED_OUTPATIENT_CLINIC_OR_DEPARTMENT_OTHER)
Admission: EM | Admit: 2013-06-11 | Discharge: 2013-06-11 | Disposition: A | Discharge status: Home / Self Care | Payer: Medicare Other | Attending: Emergency Medicine | Admitting: Emergency Medicine

## 2013-06-11 ENCOUNTER — Encounter (HOSPITAL_BASED_OUTPATIENT_CLINIC_OR_DEPARTMENT_OTHER): Payer: Self-pay | Admitting: Emergency Medicine

## 2013-06-11 DIAGNOSIS — Z79899 Other long term (current) drug therapy: Secondary | ICD-10-CM | POA: Insufficient documentation

## 2013-06-11 DIAGNOSIS — S60012A Contusion of left thumb without damage to nail, initial encounter: Secondary | ICD-10-CM

## 2013-06-11 DIAGNOSIS — W19XXXA Unspecified fall, initial encounter: Secondary | ICD-10-CM

## 2013-06-11 DIAGNOSIS — Z8639 Personal history of other endocrine, nutritional and metabolic disease: Secondary | ICD-10-CM | POA: Insufficient documentation

## 2013-06-11 DIAGNOSIS — S6000XA Contusion of unspecified finger without damage to nail, initial encounter: Secondary | ICD-10-CM | POA: Insufficient documentation

## 2013-06-11 DIAGNOSIS — Y9389 Activity, other specified: Secondary | ICD-10-CM | POA: Insufficient documentation

## 2013-06-11 DIAGNOSIS — Z95818 Presence of other cardiac implants and grafts: Secondary | ICD-10-CM | POA: Insufficient documentation

## 2013-06-11 DIAGNOSIS — S0990XA Unspecified injury of head, initial encounter: Secondary | ICD-10-CM

## 2013-06-11 DIAGNOSIS — IMO0002 Reserved for concepts with insufficient information to code with codable children: Secondary | ICD-10-CM

## 2013-06-11 DIAGNOSIS — Z862 Personal history of diseases of the blood and blood-forming organs and certain disorders involving the immune mechanism: Secondary | ICD-10-CM | POA: Insufficient documentation

## 2013-06-11 DIAGNOSIS — I2510 Atherosclerotic heart disease of native coronary artery without angina pectoris: Secondary | ICD-10-CM | POA: Insufficient documentation

## 2013-06-11 DIAGNOSIS — Z7982 Long term (current) use of aspirin: Secondary | ICD-10-CM | POA: Insufficient documentation

## 2013-06-11 DIAGNOSIS — I10 Essential (primary) hypertension: Secondary | ICD-10-CM | POA: Insufficient documentation

## 2013-06-11 DIAGNOSIS — Y929 Unspecified place or not applicable: Secondary | ICD-10-CM | POA: Insufficient documentation

## 2013-06-11 DIAGNOSIS — W1809XA Striking against other object with subsequent fall, initial encounter: Secondary | ICD-10-CM | POA: Insufficient documentation

## 2013-06-11 DIAGNOSIS — I509 Heart failure, unspecified: Secondary | ICD-10-CM | POA: Insufficient documentation

## 2013-06-11 DIAGNOSIS — Z87891 Personal history of nicotine dependence: Secondary | ICD-10-CM | POA: Insufficient documentation

## 2013-06-11 DIAGNOSIS — Z859 Personal history of malignant neoplasm, unspecified: Secondary | ICD-10-CM | POA: Insufficient documentation

## 2013-06-11 DIAGNOSIS — I4891 Unspecified atrial fibrillation: Secondary | ICD-10-CM | POA: Insufficient documentation

## 2013-06-11 DIAGNOSIS — S51809A Unspecified open wound of unspecified forearm, initial encounter: Secondary | ICD-10-CM | POA: Insufficient documentation

## 2013-06-11 DIAGNOSIS — Z7901 Long term (current) use of anticoagulants: Secondary | ICD-10-CM | POA: Insufficient documentation

## 2013-06-11 NOTE — ED Provider Notes (Signed)
CSN: [id number]     Arrival date & time 06/11/13  1859 History  This chart was scribed for Threasa Beards, MD by Elby Beck, ED Scribe. This patient was seen in room MH09/MH09 and the patient's care was started at 8:28 PM.   Chief Complaint  Patient presents with  . Fall    Patient is a 80 y.o. male presenting with fall. The history is provided by the patient. No language interpreter was used.  Fall This is a recurrent (one prior fall) problem. The current episode started 1 to 2 hours ago. The problem occurs rarely. The problem has not changed since onset.Nothing aggravates the symptoms. Nothing relieves the symptoms. He has tried nothing for the symptoms.   HPI Comments: Evan Perez is a 80 y.o. male who presents to the Emergency Department complaining of a fall that occurred earlier today. Pt states that he had an AAA repair procedure on 01/29/13 and that he has had swelling in his lower extremities since. He states that this has caused him to injure his back over the past few months. He states that due to his leg swelling and back injury, he fell today while changing pants, and landed with his right arm and left thumb on as dresser. He also states that he hit his head at the time of the fall. He is complaining of "throbbing" pain in his left thumb and a bruise on his right forearm onset after the fall. Pt states that he is on Coumadin due to his history of Afib. He denies neck pain or any other pain or symptoms.   Past Medical History  Diagnosis Date  . History of aortic stenosis     Severe s/p bovine pericardial tissue AVR 04/2010  . Hypertension   . Fall 2011    FRACTURE VERTEBRAL  . CHF (congestive heart failure)     LV DYSFUNCTION -EF 55% JAN 2012  . Permanent atrial fibrillation     On chronic Coumadin anticoagulation  . Cancer   . CAD (coronary artery disease)     Nonobstructive by 2011 cath  . Hyperlipidemia   . Complication of anesthesia     "drowsy for 2 days after  OR" (01/26/2013)  . AAA (abdominal aortic aneurysm)     AAA/left iliac aneurysm (3.9 cm)/notes 01/26/2013   Past Surgical History  Procedure Laterality Date  . Aortic valve replacement      NOV 2011-TISSUE VALVE, 23 mm Mitroflow aortic pericardial heart valve  . Cardiac catheterization      03/2010-non obstructive CAD; severe AS  . Vasectomy    . Tonsillectomy    . Cardiac valve replacement    . Embolization      coil embolization of the left hypogastric artery prior to endovascular repair/notes 01/26/2013  . Cataract extraction w/ intraocular lens  implant, bilateral Bilateral     "right ~ 1999; left ~ 2012" (01/26/2013)  . Abdominal aortic endovascular stent graft N/A 01/29/2013    Procedure: ABDOMINAL AORTIC ENDOVASCULAR STENT GRAFT - GORE; ULTRASOUND GUIDED;  Surgeon: Serafina Mitchell, MD;  Location: [facility] OR;  Service: Vascular;  Laterality: N/A;   Family History  Problem Relation Age of Onset  . Hypertension Mother   . Heart disease Mother   . Heart attack Mother   . Hypertension Father   . Heart disease Father   . Heart disease Brother    History  Substance Use Topics  . Smoking status: Former Smoker -- 0.75 packs/day for 40 years  Types: Cigarettes  . Smokeless tobacco: Never Used     Comment: 01/26/2013 "stopped smoking for good in ~ 1985"  . Alcohol Use: 1.2 oz/week    2 Glasses of wine per week    Review of Systems  Musculoskeletal: Negative for neck pain.       Right forearm and left thumb pain  Neurological: Negative for syncope.  All other systems reviewed and are negative.   Allergies  Dust mite extract; Lipitor; Metolazone; Ramipril; Zocor; and Lovenox  Home Medications   Current Outpatient Rx  Name  Route  Sig  Dispense  Refill  . aspirin 81 MG tablet   Oral   Take 81 mg by mouth every Monday, Wednesday, and Friday.          Marland Kitchen azelastine (ASTELIN) 137 MCG/SPRAY nasal spray   Nasal   Place 1 spray into the nose 2 (two) times daily as needed. Use in  each nostril as directed         . betamethasone valerate (VALISONE) 0.1 % cream      Apply to rash on lower legs daily (do not apply to open wound)   45 g   1   . cephALEXin (KEFLEX) 500 MG capsule      1 tab po bid x 10d   20 capsule   0   . cloNIDine (CATAPRES) 0.1 MG tablet   Oral   Take 0.1 mg by mouth 2 (two) times daily.         Marland Kitchen docusate sodium (COLACE) 100 MG capsule   Oral   Take 100 mg by mouth 2 (two) times daily.         . furosemide (LASIX) 40 MG tablet   Oral   Take 40 mg by mouth as directed. 3 tablets in the morning and 2 after lunch         . guaiFENesin (MUCINEX) 600 MG 12 hr tablet   Oral   Take 600 mg by mouth 2 (two) times daily as needed for congestion. For congestion.         . hydrALAZINE (APRESOLINE) 10 MG tablet   Oral   Take 1 tablet (10 mg total) by mouth 3 (three) times daily.   90 tablet   3   . levothyroxine (SYNTHROID, LEVOTHROID) 25 MCG tablet   Oral   Take 1 tablet (25 mcg total) by mouth daily.   30 tablet   11   . loratadine (CLARITIN) 10 MG tablet   Oral   Take 10 mg by mouth daily.         Marland Kitchen losartan (COZAAR) 25 MG tablet   Oral   Take 1 tablet (25 mg total) by mouth daily.   30 tablet   5   . mometasone (NASONEX) 50 MCG/ACT nasal spray   Nasal   Place 2 sprays into the nose daily as needed (for allergies).          . Multiple Vitamins-Iron (MULTIVITAMIN/IRON PO)   Oral   Take by mouth daily.         Marland Kitchen OVER THE COUNTER MEDICATION   Each Nare   Place 1 spray into both nostrils daily as needed (for allergies). neilmed nasal wash         . polyethylene glycol (MIRALAX / GLYCOLAX) packet   Oral   Take 17 g by mouth as needed.         . potassium chloride (K-DUR) 10 MEQ tablet  3 tabs in the am and 2 tabs in the pm   360 tablet   3   . traZODone (DESYREL) 25 mg TABS tablet   Oral   Take 25 mg by mouth as directed. 2 tablets at bedtime         . warfarin (COUMADIN) 5 MG tablet    Oral   Take 1 tablet (5 mg total) by mouth as directed.   45 tablet   3     PATIENT REQUEST JANTOVEN   . diphenhydrAMINE (BENADRYL) 25 MG tablet   Oral   Take 25 mg by mouth at bedtime.          . mupirocin ointment (BACTROBAN) 2 %      Apply to affected area of right ankle bid x 10d   30 g   0    Triage Vitals: BP 193/98  Pulse 88  Temp(Src) 98.6 F (37 C)  Ht 5\' 10"  (1.778 m)  Wt 170 lb (77.111 kg)  BMI 24.39 kg/m2  SpO2 98%  Physical Exam  Nursing note and vitals reviewed. Constitutional: He is oriented to person, place, and time. He appears well-developed and well-nourished. No distress.  HENT:  Head: Normocephalic.  1 cm hematoma on right scalp.  Eyes: EOM are normal.  Neck: Neck supple. No tracheal deviation present.  Cardiovascular: Normal rate.   Aortic valve click.  Pulmonary/Chest: Effort normal. No respiratory distress.  Musculoskeletal: Normal range of motion.  Tenderness to palpation over MP joint of left thumb. Mild bruising.  Neurological: He is alert and oriented to person, place, and time.  Strength and sensation intact.  Skin: Skin is warm and dry.  Skin tear on right forearm.  Psychiatric: He has a normal mood and affect. His behavior is normal.    ED Course  Procedures (including critical care time)  DIAGNOSTIC STUDIES: Oxygen Saturation is 98% on RA, normal by my interpretation.    COORDINATION OF CARE: 8:35 PM- Discussed normal radiology findings. Pt advised of plan for treatment and pt agrees.  Labs Review Labs Reviewed - No data to display Imaging Review Dg Forearm Right  06/11/2013   CLINICAL DATA:  Fall.  Pain.  EXAM: RIGHT FOREARM - 2 VIEW  COMPARISON:  None available for comparison at time of study interpretation.  FINDINGS: There is no evidence of fracture or other focal bone lesions. Dorsal forearm soft tissue swelling with overlying bandage, no radiopaque foreign bodies.  IMPRESSION: No acute fracture deformity.    Electronically Signed   By: Elon Alas   On: 06/11/2013 20:04   Ct Head Wo Contrast  06/11/2013   CLINICAL DATA:  Fall, injury  EXAM: CT HEAD WITHOUT CONTRAST  TECHNIQUE: Contiguous axial images were obtained from the base of the skull through the vertex without intravenous contrast.  COMPARISON:  CT HEAD W/O CM dated 01/09/2012  FINDINGS: There is no evidence of mass effect, midline shift, or extra-axial fluid collections. There is no evidence of a space-occupying lesion or intracranial hemorrhage. There is no evidence of a cortical-based area of acute infarction. There is generalized cerebral atrophy. There is periventricular white matter low attenuation likely secondary to microangiopathy.  The ventricles and sulci are appropriate for the patient's age. The basal cisterns are patent.  Visualized portions of the orbits are unremarkable. The visualized portions of the paranasal sinuses and mastoid air cells are unremarkable. Cerebrovascular atherosclerotic calcifications are noted.  The osseous structures are unremarkable.  IMPRESSION: No acute intracranial pathology.  Electronically Signed   By: Kathreen Devoid   On: 06/11/2013 20:05   Dg Hand Complete Left  06/11/2013   CLINICAL DATA:  Fall, left thumb pain and swelling.  EXAM: LEFT HAND - COMPLETE 3+ VIEW  COMPARISON:  None available for comparison at time of study interpretation.  FINDINGS: No acute fracture deformity or dislocation. Joint space intact without erosions. No destructive bony lesions. Soft tissue planes are not suspicious.  IMPRESSION: Negative.   Electronically Signed   By: Elon Alas   On: 06/11/2013 20:03    EKG Interpretation   None       MDM   1. Fall, initial encounter   2. Minor head injury, initial encounter   3. Contusion of left thumb, initial encounter   4. Skin tear    Pt presenting after mechanical fall.  He is on anticoagulation for chronic afib, no recent changes in dosage. Skin tear on right forearm,  contusion of left thumb, small hematoma on right side of scalp- no active bleeding. CT head reassuring, xrays reassuring. Pt is awake, alert, talkative, smiling and interactive.   Discharged with strict return precautions.  Pt agreeable with plan.  I personally performed the services described in this documentation, which was scribed in my presence. The recorded information has been reviewed and is accurate.    Threasa Beards, MD 06/11/13 2242

## 2013-06-11 NOTE — Discharge Instructions (Signed)
Return to the ED with any concerns including vomiting, seizure activity, increased headache, decreased level of alertness/lethargy, or any other alarming symptoms

## 2013-06-11 NOTE — ED Notes (Signed)
Reports that he fell and hit right side of head, right thumb, right forearm, skin tears noted, covered with opsite and left elbow, no obvious hematoma noted to head, pt reports that they had a difficult time getting bleeding from skin tear to right forearm to stop

## 2013-06-11 NOTE — ED Notes (Signed)
Pt with compression dressing to rle, states he had blister a month ago and area has increased in size, pt being followed by wound clinic, has hhrn following at home for dressing changes

## 2013-06-11 NOTE — ED Notes (Signed)
Pt was standing at side of bed, lost his balance fell and hit his head on dresser, c/o bruise to left thumb, no loc, pt from independent living at [facility], on asa, vs stable in route, ambulatory alert and oriented, previous visits secondary falls

## 2013-06-15 ENCOUNTER — Other Ambulatory Visit (INDEPENDENT_AMBULATORY_CARE_PROVIDER_SITE_OTHER): Payer: Medicare Other

## 2013-06-15 DIAGNOSIS — E785 Hyperlipidemia, unspecified: Secondary | ICD-10-CM

## 2013-06-15 DIAGNOSIS — Z952 Presence of prosthetic heart valve: Secondary | ICD-10-CM

## 2013-06-15 DIAGNOSIS — I5033 Acute on chronic diastolic (congestive) heart failure: Secondary | ICD-10-CM

## 2013-06-15 DIAGNOSIS — Z953 Presence of xenogenic heart valve: Secondary | ICD-10-CM

## 2013-06-15 LAB — CBC WITH DIFFERENTIAL/PLATELET
Basophils Absolute: 0 10*3/uL (ref 0.0–0.1)
Basophils Relative: 0.5 % (ref 0.0–3.0)
EOS PCT: 12.3 % — AB (ref 0.0–5.0)
Eosinophils Absolute: 0.7 10*3/uL (ref 0.0–0.7)
HCT: 29.7 % — ABNORMAL LOW (ref 39.0–52.0)
HEMOGLOBIN: 9.8 g/dL — AB (ref 13.0–17.0)
LYMPHS PCT: 8.4 % — AB (ref 12.0–46.0)
Lymphs Abs: 0.5 10*3/uL — ABNORMAL LOW (ref 0.7–4.0)
MCHC: 33 g/dL (ref 30.0–36.0)
MCV: 84.6 fl (ref 78.0–100.0)
Monocytes Absolute: 0.7 10*3/uL (ref 0.1–1.0)
Monocytes Relative: 13.6 % — ABNORMAL HIGH (ref 3.0–12.0)
NEUTROS ABS: 3.5 10*3/uL (ref 1.4–7.7)
NEUTROS PCT: 65.2 % (ref 43.0–77.0)
Platelets: 157 10*3/uL (ref 150.0–400.0)
RBC: 3.52 Mil/uL — ABNORMAL LOW (ref 4.22–5.81)
RDW: 19.1 % — ABNORMAL HIGH (ref 11.5–14.6)
WBC: 5.4 10*3/uL (ref 4.5–10.5)

## 2013-06-15 LAB — HEPATIC FUNCTION PANEL
ALK PHOS: 81 U/L (ref 39–117)
ALT: 19 U/L (ref 0–53)
AST: 35 U/L (ref 0–37)
Albumin: 3.1 g/dL — ABNORMAL LOW (ref 3.5–5.2)
BILIRUBIN DIRECT: 0.2 mg/dL (ref 0.0–0.3)
TOTAL PROTEIN: 7.9 g/dL (ref 6.0–8.3)
Total Bilirubin: 0.7 mg/dL (ref 0.3–1.2)

## 2013-06-15 LAB — BASIC METABOLIC PANEL
BUN: 41 mg/dL — ABNORMAL HIGH (ref 6–23)
CHLORIDE: 93 meq/L — AB (ref 96–112)
CO2: 28 mEq/L (ref 19–32)
CREATININE: 2.2 mg/dL — AB (ref 0.4–1.5)
Calcium: 8.4 mg/dL (ref 8.4–10.5)
GFR: 30.62 mL/min — ABNORMAL LOW (ref >60.00)
Glucose, Bld: 158 mg/dL — ABNORMAL HIGH (ref 70–99)
POTASSIUM: 3.9 meq/L (ref 3.5–5.1)
SODIUM: 130 meq/L — AB (ref 135–145)

## 2013-06-15 NOTE — Progress Notes (Signed)
Quick Note:  Please make copy of labs for patient visit. ______ 

## 2013-06-17 ENCOUNTER — Encounter: Payer: Self-pay | Admitting: Cardiology

## 2013-06-17 ENCOUNTER — Encounter (HOSPITAL_BASED_OUTPATIENT_CLINIC_OR_DEPARTMENT_OTHER): Payer: Medicare Other | Attending: General Surgery

## 2013-06-17 ENCOUNTER — Ambulatory Visit (INDEPENDENT_AMBULATORY_CARE_PROVIDER_SITE_OTHER): Payer: Medicare Other | Admitting: Pharmacist

## 2013-06-17 ENCOUNTER — Ambulatory Visit (INDEPENDENT_AMBULATORY_CARE_PROVIDER_SITE_OTHER): Payer: Medicare Other | Admitting: Cardiology

## 2013-06-17 VITALS — BP 147/68 | HR 89 | Ht 70.0 in | Wt 162.0 lb

## 2013-06-17 DIAGNOSIS — Z952 Presence of prosthetic heart valve: Secondary | ICD-10-CM

## 2013-06-17 DIAGNOSIS — I359 Nonrheumatic aortic valve disorder, unspecified: Secondary | ICD-10-CM

## 2013-06-17 DIAGNOSIS — L97809 Non-pressure chronic ulcer of other part of unspecified lower leg with unspecified severity: Secondary | ICD-10-CM | POA: Insufficient documentation

## 2013-06-17 DIAGNOSIS — I119 Hypertensive heart disease without heart failure: Secondary | ICD-10-CM

## 2013-06-17 DIAGNOSIS — Z953 Presence of xenogenic heart valve: Secondary | ICD-10-CM

## 2013-06-17 DIAGNOSIS — I4891 Unspecified atrial fibrillation: Secondary | ICD-10-CM

## 2013-06-17 DIAGNOSIS — Z954 Presence of other heart-valve replacement: Secondary | ICD-10-CM | POA: Insufficient documentation

## 2013-06-17 DIAGNOSIS — N179 Acute kidney failure, unspecified: Secondary | ICD-10-CM

## 2013-06-17 DIAGNOSIS — Z79899 Other long term (current) drug therapy: Secondary | ICD-10-CM | POA: Insufficient documentation

## 2013-06-17 DIAGNOSIS — I872 Venous insufficiency (chronic) (peripheral): Secondary | ICD-10-CM | POA: Insufficient documentation

## 2013-06-17 DIAGNOSIS — I2510 Atherosclerotic heart disease of native coronary artery without angina pectoris: Secondary | ICD-10-CM | POA: Insufficient documentation

## 2013-06-17 DIAGNOSIS — Z7901 Long term (current) use of anticoagulants: Secondary | ICD-10-CM | POA: Insufficient documentation

## 2013-06-17 DIAGNOSIS — N189 Chronic kidney disease, unspecified: Secondary | ICD-10-CM

## 2013-06-17 DIAGNOSIS — I739 Peripheral vascular disease, unspecified: Secondary | ICD-10-CM | POA: Insufficient documentation

## 2013-06-17 LAB — POCT INR: INR: 2

## 2013-06-17 NOTE — Patient Instructions (Addendum)
DECREASE YOUR LASIX (FUROSEMIDE) TO 40 MG 2 IN THE [facility] AND 1 IN THE AFTERNOON   Your physician recommends that you schedule a follow-up appointment in: MID MARCH OV/BMET/CBC

## 2013-06-17 NOTE — Assessment & Plan Note (Signed)
The patient is in chronic atrial fibrillation on Coumadin.  No TIA symptoms.

## 2013-06-17 NOTE — Assessment & Plan Note (Signed)
The patient's prosthetic aortic valve is working well.  He has not had any symptoms of exertional chest pain or of paroxysmal nocturnal dyspnea.  His previous peripheral edema has essentially resolved with high-dose Lasix

## 2013-06-17 NOTE — Progress Notes (Signed)
Lucien Mons Date of Birth:  [birth year] [address] [place], [place]  [address] [phone]         Fax   [phone]  History of Present Illness: This pleasant 80 year old gentleman who is a resident of [facility] is seen for a scheduled followup visit. He has a past history of previous severe aortic stenosis. An 04/12/10 he underwent aortic valve replacement with a bovine pericardial tissue valve by Dr. Laneta Simmers. His cardiac catheterization preoperatively showed only minimal coronary artery disease and he did not require coronary artery bypass graft surgery. Postoperatively the patient had a lot of problems with fluid retention and congestive heart failure which gradually resolved.  His last echocardiogram 06/20/10 showed mild left ventricular hypertrophy with ejection fraction 50-55% and the bioprosthetic aortic valve was functioning normally. There was biatrial enlargement and there was mild to moderate tricuspid regurgitation. The patient recently underwent vascular surgery.He was taken to the operating room on 01/29/2013 and underwent:  #1: Endovascular repair of abdominal aortic and left iliac aneurysm  Postoperatively the patient gained a lot of edema weight and required upward adjustment of his Lasix.  At the present time he is on 120 mg of Lasix in the morning and 80 mg in the afternoon.  Since last visit his weight is down 15 pounds and he is down to dry weight now. The patient had a updated two-dimensional echocardiogram on 05/08/13 which showed an ejection fraction of 50-55%.  There was normal bioprosthetic aortic valve function.  There was mild mitral regurgitation.  There was moderate to severe tricuspid regurgitation.  There was biatrial enlargement.  Current Outpatient Prescriptions  Medication Sig Dispense Refill  . aspirin 81 MG tablet Take 81 mg by mouth every Monday, Wednesday, and Friday.       Marland Kitchen azelastine (ASTELIN) 137  MCG/SPRAY nasal spray Place 1 spray into the nose 2 (two) times daily as needed. Use in each nostril as directed      . betamethasone valerate (VALISONE) 0.1 % cream Apply to rash on lower legs daily (do not apply to open wound)  45 g  1  . cloNIDine (CATAPRES) 0.1 MG tablet Take 0.1 mg by mouth 2 (two) times daily.      . diphenhydrAMINE (BENADRYL) 25 MG tablet Take 25 mg by mouth at bedtime.       . docusate sodium (COLACE) 100 MG capsule Take 100 mg by mouth 2 (two) times daily.      . furosemide (LASIX) 40 MG tablet Take 40 mg by mouth as directed. 2 TABLETS IN THE MORNING AND 1 IN THE AFTERNOON      . guaiFENesin (MUCINEX) 600 MG 12 hr tablet Take 600 mg by mouth 2 (two) times daily as needed for congestion. For congestion.      . hydrALAZINE (APRESOLINE) 10 MG tablet Take 1 tablet (10 mg total) by mouth 3 (three) times daily.  90 tablet  3  . levothyroxine (SYNTHROID, LEVOTHROID) 25 MCG tablet Take 1 tablet (25 mcg total) by mouth daily.  30 tablet  11  . loratadine (CLARITIN) 10 MG tablet Take 10 mg by mouth daily.      Marland Kitchen losartan (COZAAR) 25 MG tablet Take 1 tablet (25 mg total) by mouth daily.  30 tablet  5  . mometasone (NASONEX) 50 MCG/ACT nasal spray Place 2 sprays into the nose daily as needed (for allergies).       . Multiple Vitamins-Iron (MULTIVITAMIN/IRON  PO) Take by mouth daily.      . mupirocin ointment (BACTROBAN) 2 % Apply to affected area of right ankle bid x 10d  30 g  0  . OVER THE COUNTER MEDICATION Place 1 spray into both nostrils daily as needed (for allergies). neilmed nasal wash      . polyethylene glycol (MIRALAX / GLYCOLAX) packet Take 17 g by mouth as needed.      . potassium chloride (K-DUR) 10 MEQ tablet 3 tabs in the am and 2 tabs in the pm  360 tablet  3  . traZODone (DESYREL) 25 mg TABS tablet Take 25 mg by mouth as directed. 2 tablets at bedtime      . warfarin (COUMADIN) 5 MG tablet Take 1 tablet (5 mg total) by mouth as directed.  45 tablet  3  . cephALEXin  (KEFLEX) 500 MG capsule 1 tab po bid x 10d  20 capsule  0   No current facility-administered medications for this visit.    Allergies  Allergen Reactions  . Dust Mite Extract   . Keflex [Cephalexin]   . Lipitor [Atorvastatin Calcium] Other (See Comments)    Nervousness     . Metolazone Nausea Only  . Ramipril Other (See Comments)    Pt cannot remember reaction   . Zocor [Simvastatin] Other (See Comments)    Nervousness   . Lovenox [Enoxaparin Sodium] Rash    Rash on abdomen, back, lower legs.     Patient Active Problem List   Diagnosis Date Noted  . Status post aortic valve replacement with bioprosthetic valve 01/15/2012    Priority: High  . Perennial allergic rhinitis 03/13/2011    Priority: High  . Drug-induced skin rash 09/12/2010    Priority: Medium  . Benign hypertensive heart disease without heart failure 12/02/2006    Priority: Medium  . Atrial fibrillation 12/02/2006    Priority: Medium  . Edema 04/20/2013  . Retroperitoneal lymphadenopathy 04/13/2013  . Aftercare following surgery of the circulatory system, [place] 02/16/2013  . Peripheral edema 02/11/2013  . Constipation 02/11/2013  . Acute on chronic renal failure 02/01/2013  . Chronic anticoagulation 01/26/2013  . Occlusion and stenosis of carotid artery without mention of cerebral infarction 12/29/2012  . Abdominal aneurysm without mention of rupture 12/15/2012  . AORTIC STENOSIS, SEVERE 11/01/2009  . DYSPNEA 11/01/2009  . BACK PAIN 10/03/2009  . POLYMYALGIA RHEUMATICA 04/01/2009  . ERECTILE DYSFUNCTION, ORGANIC 10/26/2008  . URI 06/24/2008  . GOUT 03/10/2007  . HYPERLIPIDEMIA 12/02/2006  . BENIGN PROSTATIC HYPERTROPHY 12/02/2006    History  Smoking status  . Former Smoker -- 0.75 packs/day for 40 years  . Types: Cigarettes  Smokeless tobacco  . Never Used    Comment: 01/26/2013 "stopped smoking for good in ~ 1985"    History  Alcohol Use  . 1.2 oz/week  . 2 Glasses of wine per week    Family  History  Problem Relation Age of Onset  . Hypertension Mother   . Heart disease Mother   . Heart attack Mother   . Hypertension Father   . Heart disease Father   . Heart disease Brother     Review of Systems: Constitutional: no fever chills diaphoresis or fatigue or change in weight.  Head and neck: no hearing loss, no epistaxis, no photophobia or visual disturbance. Respiratory: No cough, shortness of breath or wheezing. Cardiovascular: No chest pain peripheral edema, palpitations. Gastrointestinal: No abdominal distention, no abdominal pain, no change in bowel habits hematochezia or  melena. Genitourinary: No dysuria, no frequency, no urgency, no nocturia. Musculoskeletal:No arthralgias, no back pain, no gait disturbance or myalgias. Neurological: No dizziness, no headaches, no numbness, no seizures, no syncope, no weakness, no tremors. Hematologic: No lymphadenopathy, no easy bruising. Psychiatric: No confusion, no hallucinations, no sleep disturbance.    Physical Exam: Filed Vitals:   06/17/13 1531  BP: 147/68  Pulse: 89   the general appearance reveals a well-developed elderly gentleman in no acute distress.  .The head and neck exam reveals pupils equal and reactive.  Extraocular movements are full.  There is no scleral icterus.  The mouth and pharynx are normal.  The neck is supple.  The carotids reveal no bruits.  The jugular venous pressure is significantly elevated.  The  thyroid is not enlarged.  There is no lymphadenopathy.  The chest reveals diminished breath sounds at the bases.  Expansion of the chest is symmetrical.  The precordium is quiet.  The first heart sound is normal.  The second heart sound is physiologically split.  There is a grade 2/6 apical systolic murmur consistent with mitral regurgitation. There is no abnormal lift or heave.  The abdomen is markedly distended with ascites.  The bowel sounds are normal.  The liver and spleen are not enlarged.   Extremities  show no pitting edema.  His right lower leg has a circumferential dressing and he is being treated at the wound care center at [facility]  He recently was treated with cephalexin which he stopped prematurely because of development of a allergic skin rash.   Assessment / Plan: The patient is to decrease Lasix to 80 mg in the morning and 40 mg in the afternoon.  If his edema recurs he will need to go back to a higher dose of Lasix. The patient is to return in 2 months for followup office visit CBC and basal metabolic panel.  He is still anemic and this could be affecting his energy level and lack of stamina. He'll be returning in early March for followup office visit after which he hopes to be able to go to [place].

## 2013-06-17 NOTE — Assessment & Plan Note (Signed)
Blood pressures remained stable on current therapy.

## 2013-06-17 NOTE — Assessment & Plan Note (Signed)
BUN and creatinine are essentially unchanged since last visit.  Clinically he is no longer fluid overloaded and we will cut back on his Lasix

## 2013-06-19 NOTE — Progress Notes (Signed)
[facility]  NAME:  Evan Perez, Evan Perez NO.:  MEDICAL RECORD NO.:  [id number]      DATE OF BIRTH:  [birth year]  PHYSICIAN:  Judene Companion, M.D.           VISIT DATE:                                  OFFICE VISIT   This is a delightful very alert 80 year old gentleman who comes to [place] with an ulcer on the posterior aspect of his right leg.  This gentleman does not have any palpable pulses, but I could hear pulses bilaterally with the Doppler.  He has a history of many problems including aortic valve replacement and also repair of an abdominal aortic aneurysm which was done in an endovascular approach.  He is on Coumadin and Lasix.  He is also on aspirin, K-Dur, losartan, and levothyroxine along with hydralazine and Mucinex.  His vital signs were; blood pressure of 135/76, respirations 17, pulse 69, temperature 97.7.  He weighs 200 pounds and he is not a diabetic.  He is on the Coumadin not only for his peripheral vascular problems, but because he is in atrial fibrillation. He will be treated with Unna boot, and I will put some silver alginate on the ulcer which is about 3 x 4 cm.  I would like to apply for an Apligraf as I think he is going to need something more aggressive on this venous stasis ulcer to get it to heal.  DIAGNOSES: 1. Venous ulcer, posterior right leg. 2. History of aortic valve replacement. 3. History of repair of aortic abdominal aneurysm. 4. History of coronary artery disease. 5. History of atrial fibrillation.  He will come back in a week.     Judene Companion, M.D.     PP/MEDQ  D:  06/17/2013  T:  06/18/2013  Job:  [id number]

## 2013-06-22 ENCOUNTER — Ambulatory Visit (INDEPENDENT_AMBULATORY_CARE_PROVIDER_SITE_OTHER): Payer: Medicare Other | Admitting: Internal Medicine

## 2013-06-22 ENCOUNTER — Encounter: Payer: Self-pay | Admitting: Internal Medicine

## 2013-06-22 VITALS — BP 126/80 | HR 55 | Temp 97.5°F | Resp 20 | Ht 70.0 in | Wt 170.0 lb

## 2013-06-22 DIAGNOSIS — Z7901 Long term (current) use of anticoagulants: Secondary | ICD-10-CM

## 2013-06-22 DIAGNOSIS — M549 Dorsalgia, unspecified: Secondary | ICD-10-CM

## 2013-06-22 DIAGNOSIS — R609 Edema, unspecified: Secondary | ICD-10-CM

## 2013-06-22 NOTE — Patient Instructions (Signed)
Call or return to clinic prn if these symptoms worsen or fail to improve as anticipated.  Take 650-1000 mg of Tylenol every 6 hours as needed for pain relief or fever.  Avoid taking more than 3000 mg in a 24-hour period (  This may cause liver damage).  

## 2013-06-22 NOTE — Progress Notes (Signed)
Pre-visit discussion using our clinic review tool. No additional management support is needed unless otherwise documented below in the visit note.  

## 2013-06-22 NOTE — Progress Notes (Signed)
Subjective:    Patient ID: Evan Perez, male    DOB: February 26, 1923, 80 y.o.   MRN: [id number]  HPI  80 year old patient who presents today with a chief complaint of low back pain. He is a considerable edema especially involving his proximal legs and has had a very difficult time bending and stooping and perform activities such as putting on socks.  He experienced some minor low back pain 3 weeks ago attempting to put on socks. Yesterday he had much more severe acute low back pain precipitated by this activity. Pain is aggravated by movement. He states he has had a remote compression fracture involving the spine. He has had a recent cardiology evaluation and his edema his changes have improved. Lasix dosing has been down titrated.  Past Medical History  Diagnosis Date  . History of aortic stenosis     Severe s/p bovine pericardial tissue AVR 04/2010  . Hypertension   . Fall 2011    FRACTURE VERTEBRAL  . CHF (congestive heart failure)     LV DYSFUNCTION -EF 55% JAN 2012  . Permanent atrial fibrillation     On chronic Coumadin anticoagulation  . Cancer   . CAD (coronary artery disease)     Nonobstructive by 2011 cath  . Hyperlipidemia   . Complication of anesthesia     "drowsy for 2 days after OR" (01/26/2013)  . AAA (abdominal aortic aneurysm)     AAA/left iliac aneurysm (3.9 cm)/notes 01/26/2013    History   Social History  . Marital Status: Widowed    Spouse Name: N/A    Number of Children: N/A  . Years of Education: N/A   Occupational History  . Not on file.   Social History Main Topics  . Smoking status: Former Smoker -- 0.75 packs/day for 40 years    Types: Cigarettes  . Smokeless tobacco: Never Used     Comment: 01/26/2013 "stopped smoking for good in ~ 1985"  . Alcohol Use: 1.2 oz/week    2 Glasses of wine per week  . Drug Use: No  . Sexual Activity: Not Currently   Other Topics Concern  . Not on file   Social History Narrative  . No narrative on file     Past Surgical History  Procedure Laterality Date  . Aortic valve replacement      NOV 2011-TISSUE VALVE, 23 mm Mitroflow aortic pericardial heart valve  . Cardiac catheterization      03/2010-non obstructive CAD; severe AS  . Vasectomy    . Tonsillectomy    . Cardiac valve replacement    . Embolization      coil embolization of the left hypogastric artery prior to endovascular repair/notes 01/26/2013  . Cataract extraction w/ intraocular lens  implant, bilateral Bilateral     "right ~ 1999; left ~ 2012" (01/26/2013)  . Abdominal aortic endovascular stent graft N/A 01/29/2013    Procedure: ABDOMINAL AORTIC ENDOVASCULAR STENT GRAFT - GORE; ULTRASOUND GUIDED;  Surgeon: Serafina Mitchell, MD;  Location: [facility] OR;  Service: Vascular;  Laterality: N/A;    Family History  Problem Relation Age of Onset  . Hypertension Mother   . Heart disease Mother   . Heart attack Mother   . Hypertension Father   . Heart disease Father   . Heart disease Brother     Allergies  Allergen Reactions  . Dust Mite Extract   . Keflex [Cephalexin]   . Lipitor [Atorvastatin Calcium] Other (See Comments)    Nervousness     .  Metolazone Nausea Only  . Ramipril Other (See Comments)    Pt cannot remember reaction   . Zocor [Simvastatin] Other (See Comments)    Nervousness   . Lovenox [Enoxaparin Sodium] Rash    Rash on abdomen, back, lower legs.     Current Outpatient Prescriptions on File Prior to Visit  Medication Sig Dispense Refill  . aspirin 81 MG tablet Take 81 mg by mouth every Monday, Wednesday, and Friday.       Marland Kitchen azelastine (ASTELIN) 137 MCG/SPRAY nasal spray Place 1 spray into the nose 2 (two) times daily as needed. Use in each nostril as directed      . betamethasone valerate (VALISONE) 0.1 % cream Apply to rash on lower legs daily (do not apply to open wound)  45 g  1  . cloNIDine (CATAPRES) 0.1 MG tablet Take 0.1 mg by mouth 2 (two) times daily.      . diphenhydrAMINE (BENADRYL) 25 MG tablet  Take 25 mg by mouth at bedtime.       . docusate sodium (COLACE) 100 MG capsule Take 100 mg by mouth 2 (two) times daily.      . furosemide (LASIX) 40 MG tablet Take 40 mg by mouth as directed. 2 TABLETS IN THE MORNING AND 1 IN THE AFTERNOON      . guaiFENesin (MUCINEX) 600 MG 12 hr tablet Take 600 mg by mouth 2 (two) times daily as needed for congestion. For congestion.      . hydrALAZINE (APRESOLINE) 10 MG tablet Take 1 tablet (10 mg total) by mouth 3 (three) times daily.  90 tablet  3  . levothyroxine (SYNTHROID, LEVOTHROID) 25 MCG tablet Take 1 tablet (25 mcg total) by mouth daily.  30 tablet  11  . loratadine (CLARITIN) 10 MG tablet Take 10 mg by mouth daily.      Marland Kitchen losartan (COZAAR) 25 MG tablet Take 1 tablet (25 mg total) by mouth daily.  30 tablet  5  . mometasone (NASONEX) 50 MCG/ACT nasal spray Place 2 sprays into the nose daily as needed (for allergies).       . Multiple Vitamins-Iron (MULTIVITAMIN/IRON PO) Take by mouth daily.      . mupirocin ointment (BACTROBAN) 2 % Apply to affected area of right ankle bid x 10d  30 g  0  . OVER THE COUNTER MEDICATION Place 1 spray into both nostrils daily as needed (for allergies). neilmed nasal wash      . polyethylene glycol (MIRALAX / GLYCOLAX) packet Take 17 g by mouth as needed.      . potassium chloride (K-DUR) 10 MEQ tablet 3 tabs in the am and 2 tabs in the pm  360 tablet  3  . traZODone (DESYREL) 25 mg TABS tablet Take 25 mg by mouth as directed. 2 tablets at bedtime      . warfarin (COUMADIN) 5 MG tablet Take 1 tablet (5 mg total) by mouth as directed.  45 tablet  3   No current facility-administered medications on file prior to visit.    BP 126/80  Pulse 55  Temp(Src) 97.5 F (36.4 C) (Oral)  Resp 20  Ht 5\' 10"  (1.778 m)  Wt 170 lb (77.111 kg)  BMI 24.39 kg/m2  SpO2 96%       Review of Systems  Constitutional: Negative for fever, chills, activity change, appetite change and fatigue.  HENT: Negative for congestion,  dental problem, ear pain, hearing loss, mouth sores, rhinorrhea, sinus pressure, sneezing, tinnitus, trouble  swallowing and voice change.   Eyes: Negative for photophobia, pain, redness and visual disturbance.  Respiratory: Negative for apnea, cough, choking, chest tightness, shortness of breath and wheezing.   Cardiovascular: Positive for leg swelling. Negative for chest pain and palpitations.  Gastrointestinal: Negative for nausea, vomiting, abdominal pain, diarrhea, constipation, blood in stool, abdominal distention, anal bleeding and rectal pain.  Genitourinary: Negative for dysuria, urgency, frequency, hematuria, flank pain, decreased urine volume, discharge, penile swelling, scrotal swelling, difficulty urinating, genital sores and testicular pain.  Musculoskeletal: Positive for arthralgias and back pain. Negative for gait problem, joint swelling, myalgias, neck pain and neck stiffness.  Skin: Negative for color change, rash and wound.  Neurological: Negative for dizziness, tremors, seizures, syncope, facial asymmetry, speech difficulty, weakness, light-headedness, numbness and headaches.  Hematological: Negative for adenopathy. Does not bruise/bleed easily.  Psychiatric/Behavioral: Negative for suicidal ideas, hallucinations, behavioral problems, confusion, sleep disturbance, self-injury, dysphoric mood, decreased concentration and agitation. The patient is not nervous/anxious.        Objective:   Physical Exam  Constitutional: He appears well-developed and well-nourished. No distress.  Neck: Normal range of motion. No JVD present.  Cardiovascular:  Irregular Grade 2/6 systolic murmur  Genitourinary:  Improved but prominent scrotal penile and thigh edema  Musculoskeletal:  No focal tenderness over the lumbar spine Pain is aggravated by movement such as transferring from a sitting position to the examining table          Assessment & Plan:   Acute lumbar strain. We'll treat  symptomatically and observe. He is on chronic Coumadin anticoagulation Status post aVR Atrial fibrillation with chronic Coumadin anticoagulation

## 2013-06-24 ENCOUNTER — Telehealth: Payer: Self-pay | Admitting: Cardiology

## 2013-06-24 DIAGNOSIS — I119 Hypertensive heart disease without heart failure: Secondary | ICD-10-CM

## 2013-06-24 NOTE — Telephone Encounter (Signed)
Left message to call back  

## 2013-06-24 NOTE — Telephone Encounter (Signed)
Wants to change Losartan to something different. Blurred vision started several weeks ago. Last eye exam this past fall. Denies any headaches, lost balance a few weeks ago trying to get shoes off. Will forward to  Dr. Mare Ferrari for review

## 2013-06-24 NOTE — Telephone Encounter (Signed)
New Problem:  Pt is c/o back pain, blurred vision, trouble sleeping... Pt believes it is side effects from some of his medications. Pt would like to speak to [place].

## 2013-06-24 NOTE — Telephone Encounter (Signed)
Okay to stop losartan. In its place we will increase his hydralazine to 25 mg 3 times a day.

## 2013-06-25 ENCOUNTER — Telehealth: Payer: Self-pay | Admitting: Cardiology

## 2013-06-25 MED ORDER — HYDRALAZINE HCL 25 MG PO TABS: 25.0000 mg | ORAL_TABLET | Freq: Three times a day (TID) | ORAL | Status: DC

## 2013-06-25 NOTE — Telephone Encounter (Signed)
New message    Talk about a nurse about new medication dr Mare Ferrari prescribed

## 2013-06-25 NOTE — Telephone Encounter (Signed)
Discussed medication increase with patient and he will take Hydralazine as directed

## 2013-06-25 NOTE — Telephone Encounter (Signed)
Advised patient

## 2013-07-01 ENCOUNTER — Ambulatory Visit (INDEPENDENT_AMBULATORY_CARE_PROVIDER_SITE_OTHER): Payer: Medicare Other | Admitting: Pharmacist

## 2013-07-01 DIAGNOSIS — I359 Nonrheumatic aortic valve disorder, unspecified: Secondary | ICD-10-CM

## 2013-07-01 DIAGNOSIS — Z952 Presence of prosthetic heart valve: Secondary | ICD-10-CM

## 2013-07-01 DIAGNOSIS — I4891 Unspecified atrial fibrillation: Secondary | ICD-10-CM

## 2013-07-01 DIAGNOSIS — Z953 Presence of xenogenic heart valve: Secondary | ICD-10-CM

## 2013-07-01 LAB — POCT INR: INR: 2

## 2013-07-02 ENCOUNTER — Telehealth: Payer: Self-pay | Admitting: Internal Medicine

## 2013-07-02 NOTE — Telephone Encounter (Signed)
Patient Information:  Caller Name: [place]  Phone: [phone]  Patient: Evan Perez, Evan Perez  Gender: Male  DOB: 1922/07/25  Age: 80 Years  PCP: Bluford Kaufmann (Family Practice > 83yrs old)  Office Follow Up:  Does the office need to follow up with this patient?: Yes  Instructions For The Office: Patient only wants to see Dr. Raliegh Ip.  Derek Jack not in office tomorrow 07/03/13. Declined appt . Declines [facility]  RN Note:  Patient declines appt. He only wants to see Dr. Raliegh Ip.  He states she has a dinner appt tonight and must get off the phone. Advised of office open tomorrow and he may see another provider. Declined appt. Declined [facility].  Symptoms  Reason For Call & Symptoms: Patient states he was seen in the office  06/22/2013,  Had a rash on his back down to buttocks,  chest , upper arms.  +itching.  He states that he showed it to Dr. Raliegh Ip and it was thought to be drying up so nothing was given.  It is not worse , but itching is intense.  small bumps. Unsure of color. scratched and made bleed.  Home treatment of Hydrocortisoone and betamethsone ointment and it is not helping.  Patinet unable to give start date of rash "early part of January" and started while taking Keflex medication prescribed 06/03/13. Stopped medication after 8 days.  Reviewed Health History In EMR: Yes  Reviewed Medications In EMR: Yes  Reviewed Allergies In EMR: Yes  Reviewed Surgeries / Procedures: Yes  Date of Onset of Symptoms: 06/11/2013  Treatments Tried: hydrocortison, betamethsone  Treatments Tried Worked: No  Guideline(s) Used:  Rash or Redness - Widespread  Hives  Rash - Widespread on Drugs - Drug Reaction  Disposition Per Guideline:   See Today in Office  Reason For Disposition Reached:   Severe itching  Advice Given:  For Itchy Rashes:  Wash the skin once with gentle non-perfumed soap to remove any irritants. Rinse the soap off thoroughly.  You may also take an oatmeal (Aveeno) bath or take an antihistamine  medication by mouth to help reduce the itching.  Oatmeal Aveeno Bath for Itching:  Sprinkle contents of one Aveeno packet under running faucet with comfortably warm water. Bathe for 15 - 20 minutes, 1-2 times daily.  Pat dry with a towel. Do not rub the rash.  Call Back If:   You become worse  Oral Antihistamine Medication for Itching:  Take an antihistamine like diphenhydramine (Benadryl) for widespread rashes that itch. The adult dosage of Benadryl is 25-50 mg by mouth 4 times daily.  An over-the-counter antihistamine that causes less sleepiness is loratadine (e.g., Alavert or Claritin).  Patient Refused Recommendation:  Patient Refused Appt, Patient Requests Appt At Later Date  Patient wants to see only Dr. Raliegh Ip

## 2013-07-03 NOTE — Telephone Encounter (Signed)
Called and spoke with pt and pt states he did not sleep well last nigh due to rash itching.  Pt declined to see another physician but wants to be seen on 2/3.  Appt made and pt is aware.

## 2013-07-07 ENCOUNTER — Ambulatory Visit (INDEPENDENT_AMBULATORY_CARE_PROVIDER_SITE_OTHER): Payer: Medicare Other | Admitting: Internal Medicine

## 2013-07-07 ENCOUNTER — Encounter: Payer: Self-pay | Admitting: Internal Medicine

## 2013-07-07 VITALS — BP 130/80 | HR 86 | Temp 97.8°F | Resp 20 | Ht 70.0 in | Wt 165.0 lb

## 2013-07-07 DIAGNOSIS — L270 Generalized skin eruption due to drugs and medicaments taken internally: Secondary | ICD-10-CM

## 2013-07-07 DIAGNOSIS — R609 Edema, unspecified: Secondary | ICD-10-CM

## 2013-07-07 NOTE — Progress Notes (Signed)
Subjective:    Patient ID: Evan Perez, male    DOB: 1923/04/07, 80 y.o.   MRN: [id number]  HPI  80 year old patient who is seen today with a chief complaint of a generalized pruritic dermatitis. This began following a prescription for Keflex on December 31. This was prescribed for A. infected venous stasis ulcer involving his right lateral ankle area. He has been on Claritin and a bedtime dose of Benadryl. His back pain is much improved His peripheral edema is improving and he is tolerated a dose reduction in his furosemide. He is followed closely by cardiology  Past Medical History  Diagnosis Date  . History of aortic stenosis     Severe s/p bovine pericardial tissue AVR 04/2010  . Hypertension   . Fall 2011    FRACTURE VERTEBRAL  . CHF (congestive heart failure)     LV DYSFUNCTION -EF 55% JAN 2012  . Permanent atrial fibrillation     On chronic Coumadin anticoagulation  . Cancer   . CAD (coronary artery disease)     Nonobstructive by 2011 cath  . Hyperlipidemia   . Complication of anesthesia     "drowsy for 2 days after OR" (01/26/2013)  . AAA (abdominal aortic aneurysm)     AAA/left iliac aneurysm (3.9 cm)/notes 01/26/2013    History   Social History  . Marital Status: Widowed    Spouse Name: N/A    Number of Children: N/A  . Years of Education: N/A   Occupational History  . Not on file.   Social History Main Topics  . Smoking status: Former Smoker -- 0.75 packs/day for 40 years    Types: Cigarettes  . Smokeless tobacco: Never Used     Comment: 01/26/2013 "stopped smoking for good in ~ 1985"  . Alcohol Use: 1.2 oz/week    2 Glasses of wine per week  . Drug Use: No  . Sexual Activity: Not Currently   Other Topics Concern  . Not on file   Social History Narrative  . No narrative on file    Past Surgical History  Procedure Laterality Date  . Aortic valve replacement      NOV 2011-TISSUE VALVE, 23 mm Mitroflow aortic pericardial heart valve  . Cardiac  catheterization      03/2010-non obstructive CAD; severe AS  . Vasectomy    . Tonsillectomy    . Cardiac valve replacement    . Embolization      coil embolization of the left hypogastric artery prior to endovascular repair/notes 01/26/2013  . Cataract extraction w/ intraocular lens  implant, bilateral Bilateral     "right ~ 1999; left ~ 2012" (01/26/2013)  . Abdominal aortic endovascular stent graft N/A 01/29/2013    Procedure: ABDOMINAL AORTIC ENDOVASCULAR STENT GRAFT - GORE; ULTRASOUND GUIDED;  Surgeon: Serafina Mitchell, MD;  Location: [facility] OR;  Service: Vascular;  Laterality: N/A;    Family History  Problem Relation Age of Onset  . Hypertension Mother   . Heart disease Mother   . Heart attack Mother   . Hypertension Father   . Heart disease Father   . Heart disease Brother     Allergies  Allergen Reactions  . Dust Mite Extract   . Keflex [Cephalexin]   . Lipitor [Atorvastatin Calcium] Other (See Comments)    Nervousness     . Losartan     Blurred vision, trouble sleeping  . Metolazone Nausea Only  . Ramipril Other (See Comments)    Pt cannot  remember reaction   . Zocor [Simvastatin] Other (See Comments)    Nervousness   . Lovenox [Enoxaparin Sodium] Rash    Rash on abdomen, back, lower legs.     Current Outpatient Prescriptions on File Prior to Visit  Medication Sig Dispense Refill  . aspirin 81 MG tablet Take 81 mg by mouth every Monday, Wednesday, and Friday.       Marland Kitchen azelastine (ASTELIN) 137 MCG/SPRAY nasal spray Place 1 spray into the nose 2 (two) times daily as needed. Use in each nostril as directed      . betamethasone valerate (VALISONE) 0.1 % cream Apply to rash on lower legs daily (do not apply to open wound)  45 g  1  . cloNIDine (CATAPRES) 0.1 MG tablet Take 0.1 mg by mouth 2 (two) times daily.      . diphenhydrAMINE (BENADRYL) 25 MG tablet Take 25 mg by mouth at bedtime.       . docusate sodium (COLACE) 100 MG capsule Take 100 mg by mouth 2 (two) times daily.       . furosemide (LASIX) 40 MG tablet Take 40 mg by mouth as directed. 2 TABLETS IN THE MORNING AND 1 IN THE AFTERNOON      . guaiFENesin (MUCINEX) 600 MG 12 hr tablet Take 600 mg by mouth 2 (two) times daily as needed for congestion. For congestion.      . hydrALAZINE (APRESOLINE) 25 MG tablet Take 1 tablet (25 mg total) by mouth 3 (three) times daily.  90 tablet  5  . levothyroxine (SYNTHROID, LEVOTHROID) 25 MCG tablet Take 1 tablet (25 mcg total) by mouth daily.  30 tablet  11  . loratadine (CLARITIN) 10 MG tablet Take 10 mg by mouth daily.      . mometasone (NASONEX) 50 MCG/ACT nasal spray Place 2 sprays into the nose daily as needed (for allergies).       . Multiple Vitamins-Iron (MULTIVITAMIN/IRON PO) Take by mouth daily.      . mupirocin ointment (BACTROBAN) 2 % Apply to affected area of right ankle bid x 10d  30 g  0  . OVER THE COUNTER MEDICATION Place 1 spray into both nostrils daily as needed (for allergies). neilmed nasal wash      . polyethylene glycol (MIRALAX / GLYCOLAX) packet Take 17 g by mouth as needed.      . potassium chloride (K-DUR) 10 MEQ tablet 3 tabs in the am and 2 tabs in the pm  360 tablet  3  . traZODone (DESYREL) 25 mg TABS tablet Take 25 mg by mouth as directed. 2 tablets at bedtime      . warfarin (COUMADIN) 5 MG tablet Take 1 tablet (5 mg total) by mouth as directed.  45 tablet  3   No current facility-administered medications on file prior to visit.    BP 130/80  Pulse 86  Temp(Src) 97.8 F (36.6 C) (Oral)  Resp 20  Ht 5\' 10"  (1.778 m)  Wt 165 lb (74.844 kg)  BMI 23.68 kg/m2  SpO2 96%     Review of Systems  Cardiovascular: Positive for leg swelling.  Skin: Positive for rash.       Objective:   Physical Exam  Constitutional: He appears well-developed and well-nourished. No distress.  Musculoskeletal: He exhibits edema.  Skin:  Generalized erythematous papular rash with excoriations          Assessment & Plan:   Apparent drug-induced  dermatitis. The patient's pruritus is reasonably well  controlled and the rash appears to be improving. We'll avoid steroids in view of his struggle with edema. Will add a H2 blocker and continue Claritin

## 2013-07-07 NOTE — Progress Notes (Signed)
Pre-visit discussion using our clinic review tool. No additional management support is needed unless otherwise documented below in the visit note.  

## 2013-07-07 NOTE — Patient Instructions (Addendum)
Pepcid AC-use twice daily Continue Claritin once daily  Continue topical steroids twice daily  Limit your sodium (Salt) intake

## 2013-07-08 ENCOUNTER — Encounter (HOSPITAL_BASED_OUTPATIENT_CLINIC_OR_DEPARTMENT_OTHER): Payer: Medicare Other | Attending: General Surgery

## 2013-07-08 DIAGNOSIS — L97909 Non-pressure chronic ulcer of unspecified part of unspecified lower leg with unspecified severity: Principal | ICD-10-CM | POA: Insufficient documentation

## 2013-07-08 DIAGNOSIS — I890 Lymphedema, not elsewhere classified: Secondary | ICD-10-CM | POA: Insufficient documentation

## 2013-07-08 DIAGNOSIS — I87319 Chronic venous hypertension (idiopathic) with ulcer of unspecified lower extremity: Secondary | ICD-10-CM | POA: Insufficient documentation

## 2013-07-13 ENCOUNTER — Other Ambulatory Visit: Payer: Self-pay | Admitting: Internal Medicine

## 2013-07-15 ENCOUNTER — Other Ambulatory Visit: Payer: Self-pay

## 2013-07-15 MED ORDER — CLONIDINE HCL 0.1 MG PO TABS: 0.1000 mg | ORAL_TABLET | Freq: Two times a day (BID) | ORAL | Status: DC

## 2013-07-17 ENCOUNTER — Telehealth: Payer: Self-pay | Admitting: Cardiology

## 2013-07-17 NOTE — Telephone Encounter (Signed)
Been feeling dizzy for the last couple of days. Patient is feeling better now, not dizzy. Has no way of checking blood pressure. Patient has had a dose of Hydralazine. He had been out of his Clonidine for a couple of days and when he picked up his bottle yesterday Rx for Clonidine 0.1mg  said once tablet twice a day. Patient states he has been taking 2 tablets twice a day. Advised to resume the twice a day as previously taking, continue to monitor blood pressure at the facility and if starts feeling bad/dizzy again to go to Urgent Care/ED. Patient to call back next week with update

## 2013-07-17 NOTE — Telephone Encounter (Signed)
New message  Patient calling from [facility]     C/O  180/100, pulse rate irregular. Patient states  he not feeling .

## 2013-07-17 NOTE — Telephone Encounter (Signed)
Talked to patient and he is feeling still feeling ok and getting ready to take his evening medications. Advised patient if he starts to feel bad again to go to ED, verbalized understanding. Patient will get blood pressure check in am.

## 2013-07-18 NOTE — Telephone Encounter (Signed)
Agree with plan 

## 2013-07-22 ENCOUNTER — Ambulatory Visit (INDEPENDENT_AMBULATORY_CARE_PROVIDER_SITE_OTHER): Payer: Medicare Other | Admitting: Pharmacist

## 2013-07-22 DIAGNOSIS — Z953 Presence of xenogenic heart valve: Secondary | ICD-10-CM

## 2013-07-22 DIAGNOSIS — I359 Nonrheumatic aortic valve disorder, unspecified: Secondary | ICD-10-CM

## 2013-07-22 DIAGNOSIS — I4891 Unspecified atrial fibrillation: Secondary | ICD-10-CM

## 2013-07-22 DIAGNOSIS — Z952 Presence of prosthetic heart valve: Secondary | ICD-10-CM

## 2013-07-22 LAB — POCT INR: INR: 3.5

## 2013-07-23 ENCOUNTER — Telehealth: Payer: Self-pay | Admitting: Cardiology

## 2013-07-23 NOTE — Telephone Encounter (Signed)
With hypertension and with worse edema he should increase his Lasix to 3 tablets in the morning and 3 tablets at noon.  Avoid dietary salt.  Try to avoid sitting with his legs dangling down.  Keep them propped up.

## 2013-07-23 NOTE — Telephone Encounter (Signed)
New message     bp during wound care visit was 154/82.  Wanted nurse to know

## 2013-07-23 NOTE — Telephone Encounter (Signed)
Patient blood pressure Saturday 130/80 but weight up and swelling in feet per patient yesterday. He was on his way out the door to wound clinic and requested he call back with after the appointment will forward to  Dr. Mare Ferrari for review

## 2013-07-23 NOTE — Telephone Encounter (Signed)
Advised patient. He was on his way out the door so I will call him back tomorrow to schedule follow up ov soon

## 2013-07-24 NOTE — Telephone Encounter (Signed)
Scheduled follow up ov for next, patient aware

## 2013-07-28 ENCOUNTER — Ambulatory Visit: Payer: Medicare Other | Admitting: Cardiology

## 2013-07-28 NOTE — Telephone Encounter (Signed)
Spoke with patient and scheduled ov for tomorrow. He will call if he is unable to make it

## 2013-07-28 NOTE — Telephone Encounter (Signed)
Follow up     Called pt to resc today's appt.  He said Dr Mare Ferrari wanted to see him today.  He can come in this morning.  He has an appt for march 13th.  Should he keep that appt or come in sooner?

## 2013-07-29 ENCOUNTER — Ambulatory Visit (INDEPENDENT_AMBULATORY_CARE_PROVIDER_SITE_OTHER): Payer: Medicare Other | Admitting: Cardiology

## 2013-07-29 ENCOUNTER — Encounter: Payer: Self-pay | Admitting: Cardiology

## 2013-07-29 VITALS — BP 164/82 | HR 82 | Ht 70.0 in | Wt 179.0 lb

## 2013-07-29 DIAGNOSIS — Z953 Presence of xenogenic heart valve: Secondary | ICD-10-CM

## 2013-07-29 DIAGNOSIS — I4891 Unspecified atrial fibrillation: Secondary | ICD-10-CM

## 2013-07-29 DIAGNOSIS — N189 Chronic kidney disease, unspecified: Secondary | ICD-10-CM

## 2013-07-29 DIAGNOSIS — N179 Acute kidney failure, unspecified: Secondary | ICD-10-CM

## 2013-07-29 DIAGNOSIS — I119 Hypertensive heart disease without heart failure: Secondary | ICD-10-CM

## 2013-07-29 DIAGNOSIS — Z952 Presence of prosthetic heart valve: Secondary | ICD-10-CM

## 2013-07-29 DIAGNOSIS — R609 Edema, unspecified: Secondary | ICD-10-CM

## 2013-07-29 LAB — BASIC METABOLIC PANEL
BUN: 50 mg/dL — AB (ref 6–23)
CHLORIDE: 94 meq/L — AB (ref 96–112)
CO2: 33 meq/L — AB (ref 19–32)
CREATININE: 2 mg/dL — AB (ref 0.4–1.5)
Calcium: 8.7 mg/dL (ref 8.4–10.5)
GFR: 32.87 mL/min — ABNORMAL LOW (ref >60.00)
Glucose, Bld: 75 mg/dL (ref 70–99)
Potassium: 3.7 mEq/L (ref 3.5–5.1)
Sodium: 135 mEq/L (ref 135–145)

## 2013-07-29 LAB — CBC WITH DIFFERENTIAL/PLATELET
Basophils Absolute: 0 10*3/uL (ref 0.0–0.1)
Basophils Relative: 0.1 % (ref 0.0–3.0)
Eosinophils Absolute: 0.1 10*3/uL (ref 0.0–0.7)
Eosinophils Relative: 2.1 % (ref 0.0–5.0)
HCT: 33.9 % — ABNORMAL LOW (ref 39.0–52.0)
Hemoglobin: 10.6 g/dL — ABNORMAL LOW (ref 13.0–17.0)
Lymphocytes Relative: 7.7 % — ABNORMAL LOW (ref 12.0–46.0)
Lymphs Abs: 0.5 10*3/uL — ABNORMAL LOW (ref 0.7–4.0)
MCHC: 31.4 g/dL (ref 30.0–36.0)
MCV: 89.5 fl (ref 78.0–100.0)
MONO ABS: 0.9 10*3/uL (ref 0.1–1.0)
Monocytes Relative: 12.7 % — ABNORMAL HIGH (ref 3.0–12.0)
NEUTROS PCT: 77.4 % — AB (ref 43.0–77.0)
Neutro Abs: 5.5 10*3/uL (ref 1.4–7.7)
PLATELETS: 172 10*3/uL (ref 150.0–400.0)
RBC: 3.79 Mil/uL — ABNORMAL LOW (ref 4.22–5.81)
RDW: 22 % — ABNORMAL HIGH (ref 11.5–14.6)
WBC: 7.1 10*3/uL (ref 4.5–10.5)

## 2013-07-29 MED ORDER — LISINOPRIL 10 MG PO TABS: 10.0000 mg | ORAL_TABLET | Freq: Every day | ORAL | Status: DC

## 2013-07-29 MED ORDER — CLONIDINE HCL 0.1 MG PO TABS: ORAL_TABLET | ORAL | Status: DC

## 2013-07-29 MED ORDER — FUROSEMIDE 40 MG PO TABS: ORAL_TABLET | ORAL | Status: DC

## 2013-07-29 NOTE — Assessment & Plan Note (Signed)
His blood pressure is running high today.  He feels that the hydralazine may be contributing to his peripheral edema and erythema.  We will cut back on his hydralazine to just twice a day.  For blood pressure control and for control of edema we will and lisinopril 10 mg daily.

## 2013-07-29 NOTE — Assessment & Plan Note (Signed)
The patient is doing well in regard to his bioprosthetic aortic valve replacement.  He has not been having any chest pain or angina.  He continues to have fluid retention which is multifactorial.

## 2013-07-29 NOTE — Addendum Note (Signed)
Addended by: Alvina Filbert B on: 07/29/2013 05:11 PM   Modules accepted: Orders

## 2013-07-29 NOTE — Assessment & Plan Note (Signed)
The patient is in permanent atrial fibrillation on long-term warfarin.  The patient has not been having any TIA or stroke symptoms

## 2013-07-29 NOTE — Progress Notes (Signed)
Evan Perez Date of Birth:  29-Jul-1922 [address] [place], [place]  [address] [phone]         Fax   [phone]  History of Present Illness: This pleasant 80 year old gentleman who is a resident of [facility] is seen for a scheduled followup visit. He has a past history of previous severe aortic stenosis. An 04/12/10 he underwent aortic valve replacement with a bovine pericardial tissue valve by Dr. Laneta Simmers. His cardiac catheterization preoperatively showed only minimal coronary artery disease and he did not require coronary artery bypass graft surgery. Postoperatively the patient had a lot of problems with fluid retention and congestive heart failure which gradually resolved.  His last echocardiogram on 05/08/13 which showed an ejection fraction of 50-55%.  There was normal bioprosthetic aortic valve function.  There was mild mitral regurgitation.  There was moderate to severe tricuspid regurgitation.  There was biatrial enlargement.  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  When he was last seen in the office he appeared to be at dry weight and his Lasix was reduced.  However since then his weight has gone back up 17 pounds and he has been more dyspneic and has had more scrotal and pedal edema.  Current Outpatient Prescriptions  Medication Sig Dispense Refill  . aspirin 81 MG tablet Take 81 mg by mouth every Monday, Wednesday, and Friday.       Marland Kitchen azelastine (ASTELIN) 137 MCG/SPRAY nasal spray Place 1 spray into the nose 2 (two) times daily as needed. Use in each nostril as directed      . betamethasone valerate (VALISONE) 0.1 % cream Apply to rash on lower legs daily (do not apply to open wound)  45 g  1  .  cloNIDine (CATAPRES) 0.1 MG tablet 2 TABLETS TWICE A DAY  120 tablet  5  . diphenhydrAMINE (BENADRYL) 25 MG tablet Take 25 mg by mouth at bedtime.       . docusate sodium (COLACE) 100 MG capsule Take 100 mg by mouth 2 (two) times daily.      . furosemide (LASIX) 40 MG tablet TAKE 3 TABLETS DAILY IN THE MORNING AND TAKE 3 TABLETS BY MOUTH DAILY AT NOON  180 tablet  5  . guaiFENesin (MUCINEX) 600 MG 12 hr tablet Take 600 mg by mouth 2 (two) times daily as needed for congestion. For congestion.      . hydrALAZINE (APRESOLINE) 25 MG tablet Take 25 mg by mouth 2 (two) times daily.      Marland Kitchen levothyroxine (SYNTHROID, LEVOTHROID) 25 MCG tablet Take 1 tablet (25 mcg total) by mouth daily.  30 tablet  11  . loratadine (CLARITIN) 10 MG tablet Take 10 mg by mouth daily.      . mometasone (NASONEX) 50 MCG/ACT nasal spray Place 2 sprays into the nose daily as needed (for allergies).       . Multiple Vitamins-Iron (MULTIVITAMIN/IRON PO) Take by mouth daily.      . mupirocin ointment (BACTROBAN) 2 % Apply to affected area of right ankle bid x 10d  30 g  0  . OVER THE COUNTER MEDICATION Place 1 spray into both nostrils daily as needed (for allergies). neilmed nasal wash      .  polyethylene glycol (MIRALAX / GLYCOLAX) packet Take 17 g by mouth as needed.      . potassium chloride (K-DUR) 10 MEQ tablet 3 tabs in the am and 2 tabs in the pm  360 tablet  3  . traZODone (DESYREL) 25 mg TABS tablet Take 25 mg by mouth as directed. 2 tablets at bedtime      . warfarin (COUMADIN) 5 MG tablet Take 1 tablet (5 mg total) by mouth as directed.  45 tablet  3  . lisinopril (PRINIVIL,ZESTRIL) 10 MG tablet Take 1 tablet (10 mg total) by mouth daily.  30 tablet  5   No current facility-administered medications for this visit.    Allergies  Allergen Reactions  . Dust Mite Extract   . Keflex [Cephalexin]   . Lipitor [Atorvastatin Calcium] Other (See Comments)    Nervousness     . Losartan     Blurred vision, trouble sleeping    . Metolazone Nausea Only  . Ramipril Other (See Comments)    Pt cannot remember reaction   . Zocor [Simvastatin] Other (See Comments)    Nervousness   . Lovenox [Enoxaparin Sodium] Rash    Rash on abdomen, back, lower legs.     Patient Active Problem List   Diagnosis Date Noted  . Status post aortic valve replacement with bioprosthetic valve 01/15/2012    Priority: High  . Perennial allergic rhinitis 03/13/2011    Priority: High  . Drug-induced skin rash 09/12/2010    Priority: Medium  . Benign hypertensive heart disease without heart failure 12/02/2006    Priority: Medium  . Atrial fibrillation 12/02/2006    Priority: Medium  . Edema 04/20/2013  . Retroperitoneal lymphadenopathy 04/13/2013  . Aftercare following surgery of the circulatory system, [facility] 02/16/2013  . Peripheral edema 02/11/2013  . Constipation 02/11/2013  . Acute on chronic renal failure 02/01/2013  . Chronic anticoagulation 01/26/2013  . Occlusion and stenosis of carotid artery without mention of cerebral infarction 12/29/2012  . Abdominal aneurysm without mention of rupture 12/15/2012  . AORTIC STENOSIS, SEVERE 11/01/2009  . DYSPNEA 11/01/2009  . BACK PAIN 10/03/2009  . POLYMYALGIA RHEUMATICA 04/01/2009  . ERECTILE DYSFUNCTION, ORGANIC 10/26/2008  . URI 06/24/2008  . GOUT 03/10/2007  . HYPERLIPIDEMIA 12/02/2006  . BENIGN PROSTATIC HYPERTROPHY 12/02/2006    History  Smoking status  . Former Smoker -- 0.75 packs/day for 40 years  . Types: Cigarettes  Smokeless tobacco  . Never Used    Comment: 01/26/2013 "stopped smoking for good in ~ 1985"    History  Alcohol Use  . 1.2 oz/week  . 2 Glasses of wine per week    Family History  Problem Relation Age of Onset  . Hypertension Mother   . Heart disease Mother   . Heart attack Mother   . Hypertension Father   . Heart disease Father   . Heart disease Brother     Review of Systems: Constitutional: no fever chills diaphoresis or fatigue or  change in weight.  Head and neck: no hearing loss, no epistaxis, no photophobia or visual disturbance. Respiratory: No cough, shortness of breath or wheezing. Cardiovascular: No chest pain peripheral edema, palpitations. Gastrointestinal: No abdominal distention, no abdominal pain, no change in bowel habits hematochezia or melena. Genitourinary: No dysuria, no frequency, no urgency, no nocturia. Musculoskeletal:No arthralgias, no back pain, no gait disturbance or myalgias. Neurological: No dizziness, no headaches, no numbness, no seizures, no syncope, no weakness, no tremors. Hematologic: No lymphadenopathy, no easy bruising. Psychiatric:  No confusion, no hallucinations, no sleep disturbance.    Physical Exam: Filed Vitals:   07/29/13 1148  BP: 164/82  Pulse: 82   the general appearance reveals a well-developed elderly gentleman in no acute distress.  .The head and neck exam reveals pupils equal and reactive.  Extraocular movements are full.  There is no scleral icterus.  The mouth and pharynx are normal.  The neck is supple.  The carotids reveal no bruits.  The jugular venous pressure is significantly elevated.  The  thyroid is not enlarged.  There is no lymphadenopathy.  The chest reveals diminished breath sounds at the bases.  Expansion of the chest is symmetrical.  The precordium is quiet.  The first heart sound is normal.  The second heart sound is physiologically split.  There is a grade 2/6 apical systolic murmur consistent with mitral regurgitation. There is no abnormal lift or heave.  The abdomen is markedly distended with ascites.  The bowel sounds are normal.  The liver and spleen are not enlarged.   Extremities show moderate bilateral pitting edema. His right lower leg has a circumferential dressing and he is being treated at the [facility] care center at [facility].  He recently was treated with cephalexin which he stopped  because of development of a allergic skin  rash.   Assessment / Plan: Decrease hydralazine to twice a day.  Continue clonidine 2 tablets twice a day.  Continue taking Lasix 120 mg at breakfast and after lunch.  Add lisinopril 10 mg daily.  We are checking a basal metabolic panel and a CBC today.  His EKG today shows atrial fibrillation with controlled ventricular response. Recheck in 6 weeks for followup office visit and basal metabolic panel.

## 2013-07-29 NOTE — Patient Instructions (Addendum)
Will obtain labs today and call you with the results (BMET/CBC)  DECREASE HYDRALAZINE TO TWICE A DAY   ADD LISINOPRIL 10  MG DAILY  Your physician recommends that you schedule a follow-up appointment in: 6 WEEK OV/BMET  RETURN IN 7-10 DAYS FOR BMET (OK TO EAT AND DRINK)

## 2013-07-29 NOTE — Assessment & Plan Note (Signed)
Serum creatinines have been running in the 2.2 range chronically.  We are rechecking lab work today.

## 2013-08-03 ENCOUNTER — Encounter (HOSPITAL_BASED_OUTPATIENT_CLINIC_OR_DEPARTMENT_OTHER): Payer: Medicare Other | Attending: General Surgery

## 2013-08-03 DIAGNOSIS — I890 Lymphedema, not elsewhere classified: Secondary | ICD-10-CM | POA: Insufficient documentation

## 2013-08-03 DIAGNOSIS — L97809 Non-pressure chronic ulcer of other part of unspecified lower leg with unspecified severity: Secondary | ICD-10-CM | POA: Insufficient documentation

## 2013-08-03 DIAGNOSIS — I872 Venous insufficiency (chronic) (peripheral): Secondary | ICD-10-CM | POA: Insufficient documentation

## 2013-08-05 ENCOUNTER — Encounter (HOSPITAL_BASED_OUTPATIENT_CLINIC_OR_DEPARTMENT_OTHER): Payer: Medicare Other

## 2013-08-05 ENCOUNTER — Ambulatory Visit (INDEPENDENT_AMBULATORY_CARE_PROVIDER_SITE_OTHER): Payer: Medicare Other | Admitting: Cardiovascular Disease

## 2013-08-05 ENCOUNTER — Other Ambulatory Visit: Payer: Self-pay

## 2013-08-05 DIAGNOSIS — I4891 Unspecified atrial fibrillation: Secondary | ICD-10-CM

## 2013-08-05 DIAGNOSIS — I359 Nonrheumatic aortic valve disorder, unspecified: Secondary | ICD-10-CM

## 2013-08-05 DIAGNOSIS — Z953 Presence of xenogenic heart valve: Secondary | ICD-10-CM

## 2013-08-05 DIAGNOSIS — Z952 Presence of prosthetic heart valve: Secondary | ICD-10-CM

## 2013-08-05 LAB — POCT INR: INR: 3.1

## 2013-08-05 MED ORDER — LEVOTHYROXINE SODIUM 25 MCG PO TABS: 25.0000 ug | ORAL_TABLET | Freq: Every day | ORAL | Status: DC

## 2013-08-12 ENCOUNTER — Encounter (HOSPITAL_COMMUNITY): Payer: Self-pay | Admitting: Emergency Medicine

## 2013-08-12 ENCOUNTER — Emergency Department (HOSPITAL_COMMUNITY): Payer: Medicare Other

## 2013-08-12 ENCOUNTER — Inpatient Hospital Stay (HOSPITAL_COMMUNITY)
Admission: EM | Admit: 2013-08-12 | Discharge: 2013-08-20 | DRG: 183 | Disposition: A | Discharge status: Skilled Nursing Facility | Payer: Medicare Other | Attending: General Surgery | Admitting: General Surgery

## 2013-08-12 ENCOUNTER — Inpatient Hospital Stay (HOSPITAL_COMMUNITY): Payer: Medicare Other

## 2013-08-12 DIAGNOSIS — E8809 Other disorders of plasma-protein metabolism, not elsewhere classified: Secondary | ICD-10-CM | POA: Diagnosis present

## 2013-08-12 DIAGNOSIS — I509 Heart failure, unspecified: Secondary | ICD-10-CM | POA: Diagnosis present

## 2013-08-12 DIAGNOSIS — Z888 Allergy status to other drugs, medicaments and biological substances status: Secondary | ICD-10-CM

## 2013-08-12 DIAGNOSIS — Z7901 Long term (current) use of anticoagulants: Secondary | ICD-10-CM

## 2013-08-12 DIAGNOSIS — I5032 Chronic diastolic (congestive) heart failure: Secondary | ICD-10-CM | POA: Diagnosis present

## 2013-08-12 DIAGNOSIS — N5089 Other specified disorders of the male genital organs: Secondary | ICD-10-CM | POA: Diagnosis not present

## 2013-08-12 DIAGNOSIS — N184 Chronic kidney disease, stage 4 (severe): Secondary | ICD-10-CM | POA: Diagnosis present

## 2013-08-12 DIAGNOSIS — S272XXA Traumatic hemopneumothorax, initial encounter: Secondary | ICD-10-CM

## 2013-08-12 DIAGNOSIS — I714 Abdominal aortic aneurysm, without rupture, unspecified: Secondary | ICD-10-CM

## 2013-08-12 DIAGNOSIS — I5033 Acute on chronic diastolic (congestive) heart failure: Secondary | ICD-10-CM | POA: Diagnosis present

## 2013-08-12 DIAGNOSIS — N189 Chronic kidney disease, unspecified: Secondary | ICD-10-CM

## 2013-08-12 DIAGNOSIS — Z9181 History of falling: Secondary | ICD-10-CM

## 2013-08-12 DIAGNOSIS — R7989 Other specified abnormal findings of blood chemistry: Secondary | ICD-10-CM | POA: Diagnosis present

## 2013-08-12 DIAGNOSIS — N179 Acute kidney failure, unspecified: Secondary | ICD-10-CM | POA: Diagnosis not present

## 2013-08-12 DIAGNOSIS — I739 Peripheral vascular disease, unspecified: Secondary | ICD-10-CM | POA: Diagnosis present

## 2013-08-12 DIAGNOSIS — E785 Hyperlipidemia, unspecified: Secondary | ICD-10-CM | POA: Diagnosis present

## 2013-08-12 DIAGNOSIS — S32009A Unspecified fracture of unspecified lumbar vertebra, initial encounter for closed fracture: Secondary | ICD-10-CM | POA: Diagnosis present

## 2013-08-12 DIAGNOSIS — M79609 Pain in unspecified limb: Secondary | ICD-10-CM | POA: Diagnosis not present

## 2013-08-12 DIAGNOSIS — I214 Non-ST elevation (NSTEMI) myocardial infarction: Secondary | ICD-10-CM

## 2013-08-12 DIAGNOSIS — R748 Abnormal levels of other serum enzymes: Secondary | ICD-10-CM | POA: Diagnosis present

## 2013-08-12 DIAGNOSIS — I4891 Unspecified atrial fibrillation: Secondary | ICD-10-CM

## 2013-08-12 DIAGNOSIS — K429 Umbilical hernia without obstruction or gangrene: Secondary | ICD-10-CM | POA: Diagnosis present

## 2013-08-12 DIAGNOSIS — S271XXA Traumatic hemothorax, initial encounter: Secondary | ICD-10-CM | POA: Diagnosis present

## 2013-08-12 DIAGNOSIS — N183 Chronic kidney disease, stage 3 unspecified: Secondary | ICD-10-CM | POA: Diagnosis present

## 2013-08-12 DIAGNOSIS — I130 Hypertensive heart and chronic kidney disease with heart failure and stage 1 through stage 4 chronic kidney disease, or unspecified chronic kidney disease: Secondary | ICD-10-CM | POA: Diagnosis present

## 2013-08-12 DIAGNOSIS — J942 Hemothorax: Secondary | ICD-10-CM | POA: Diagnosis present

## 2013-08-12 DIAGNOSIS — S2241XA Multiple fractures of ribs, right side, initial encounter for closed fracture: Secondary | ICD-10-CM | POA: Diagnosis present

## 2013-08-12 DIAGNOSIS — Z952 Presence of prosthetic heart valve: Secondary | ICD-10-CM

## 2013-08-12 DIAGNOSIS — I2489 Other forms of acute ischemic heart disease: Secondary | ICD-10-CM | POA: Diagnosis present

## 2013-08-12 DIAGNOSIS — Z8249 Family history of ischemic heart disease and other diseases of the circulatory system: Secondary | ICD-10-CM

## 2013-08-12 DIAGNOSIS — S2239XA Fracture of one rib, unspecified side, initial encounter for closed fracture: Secondary | ICD-10-CM

## 2013-08-12 DIAGNOSIS — I079 Rheumatic tricuspid valve disease, unspecified: Secondary | ICD-10-CM | POA: Diagnosis present

## 2013-08-12 DIAGNOSIS — I723 Aneurysm of iliac artery: Secondary | ICD-10-CM | POA: Diagnosis present

## 2013-08-12 DIAGNOSIS — I482 Chronic atrial fibrillation, unspecified: Secondary | ICD-10-CM | POA: Diagnosis present

## 2013-08-12 DIAGNOSIS — I498 Other specified cardiac arrhythmias: Secondary | ICD-10-CM | POA: Diagnosis not present

## 2013-08-12 DIAGNOSIS — R609 Edema, unspecified: Secondary | ICD-10-CM

## 2013-08-12 DIAGNOSIS — J90 Pleural effusion, not elsewhere classified: Secondary | ICD-10-CM | POA: Diagnosis present

## 2013-08-12 DIAGNOSIS — I2129 ST elevation (STEMI) myocardial infarction involving other sites: Secondary | ICD-10-CM | POA: Diagnosis present

## 2013-08-12 DIAGNOSIS — Z7982 Long term (current) use of aspirin: Secondary | ICD-10-CM

## 2013-08-12 DIAGNOSIS — I119 Hypertensive heart disease without heart failure: Secondary | ICD-10-CM | POA: Diagnosis present

## 2013-08-12 DIAGNOSIS — R799 Abnormal finding of blood chemistry, unspecified: Secondary | ICD-10-CM

## 2013-08-12 DIAGNOSIS — I248 Other forms of acute ischemic heart disease: Secondary | ICD-10-CM | POA: Diagnosis present

## 2013-08-12 DIAGNOSIS — S2249XA Multiple fractures of ribs, unspecified side, initial encounter for closed fracture: Principal | ICD-10-CM | POA: Diagnosis present

## 2013-08-12 DIAGNOSIS — I10 Essential (primary) hypertension: Secondary | ICD-10-CM

## 2013-08-12 DIAGNOSIS — Z87891 Personal history of nicotine dependence: Secondary | ICD-10-CM

## 2013-08-12 DIAGNOSIS — Z79899 Other long term (current) drug therapy: Secondary | ICD-10-CM

## 2013-08-12 DIAGNOSIS — W19XXXA Unspecified fall, initial encounter: Secondary | ICD-10-CM | POA: Diagnosis present

## 2013-08-12 DIAGNOSIS — I2510 Atherosclerotic heart disease of native coronary artery without angina pectoris: Secondary | ICD-10-CM | POA: Diagnosis present

## 2013-08-12 DIAGNOSIS — D649 Anemia, unspecified: Secondary | ICD-10-CM | POA: Diagnosis present

## 2013-08-12 DIAGNOSIS — D62 Acute posthemorrhagic anemia: Secondary | ICD-10-CM | POA: Diagnosis present

## 2013-08-12 DIAGNOSIS — R778 Other specified abnormalities of plasma proteins: Secondary | ICD-10-CM | POA: Diagnosis present

## 2013-08-12 DIAGNOSIS — I359 Nonrheumatic aortic valve disorder, unspecified: Secondary | ICD-10-CM | POA: Diagnosis present

## 2013-08-12 HISTORY — DX: Aneurysm of iliac artery: I72.3

## 2013-08-12 HISTORY — DX: Chronic diastolic (congestive) heart failure: I50.32

## 2013-08-12 HISTORY — DX: Chronic kidney disease, stage 3 unspecified: N18.30

## 2013-08-12 HISTORY — DX: Rheumatic tricuspid insufficiency: I07.1

## 2013-08-12 HISTORY — DX: Chronic kidney disease, stage 3 (moderate): N18.3

## 2013-08-12 LAB — CBC WITH DIFFERENTIAL/PLATELET
BASOS ABS: 0 10*3/uL (ref 0.0–0.1)
Basophils Relative: 0 % (ref 0–1)
EOS PCT: 0 % (ref 0–5)
Eosinophils Absolute: 0 10*3/uL (ref 0.0–0.7)
HCT: 27.9 % — ABNORMAL LOW (ref 39.0–52.0)
Hemoglobin: 8.8 g/dL — ABNORMAL LOW (ref 13.0–17.0)
LYMPHS PCT: 4 % — AB (ref 12–46)
Lymphs Abs: 0.3 10*3/uL — ABNORMAL LOW (ref 0.7–4.0)
MCH: 27.9 pg (ref 26.0–34.0)
MCHC: 31.5 g/dL (ref 30.0–36.0)
MCV: 88.6 fL (ref 78.0–100.0)
Monocytes Absolute: 1 10*3/uL (ref 0.1–1.0)
Monocytes Relative: 11 % (ref 3–12)
NEUTROS ABS: 7.6 10*3/uL (ref 1.7–7.7)
Neutrophils Relative %: 85 % — ABNORMAL HIGH (ref 43–77)
Platelets: 184 10*3/uL (ref 150–400)
RBC: 3.15 MIL/uL — ABNORMAL LOW (ref 4.22–5.81)
RDW: 20.1 % — AB (ref 11.5–15.5)
WBC: 8.9 10*3/uL (ref 4.0–10.5)

## 2013-08-12 LAB — COMPREHENSIVE METABOLIC PANEL
ALT: 25 U/L (ref 0–53)
AST: 56 U/L — ABNORMAL HIGH (ref 0–37)
Albumin: 2.6 g/dL — ABNORMAL LOW (ref 3.5–5.2)
Alkaline Phosphatase: 106 U/L (ref 39–117)
BUN: 91 mg/dL — ABNORMAL HIGH (ref 6–23)
CALCIUM: 8.6 mg/dL (ref 8.4–10.5)
CO2: 30 meq/L (ref 19–32)
Chloride: 93 mEq/L — ABNORMAL LOW (ref 96–112)
Creatinine, Ser: 1.73 mg/dL — ABNORMAL HIGH (ref 0.50–1.35)
GFR calc Af Amer: 39 mL/min — ABNORMAL LOW (ref >90)
GFR, EST NON AFRICAN AMERICAN: 33 mL/min — AB (ref >90)
Glucose, Bld: 121 mg/dL — ABNORMAL HIGH (ref 70–99)
Potassium: 4.6 mEq/L (ref 3.7–5.3)
SODIUM: 137 meq/L (ref 137–147)
TOTAL PROTEIN: 7.9 g/dL (ref 6.0–8.3)
Total Bilirubin: 0.6 mg/dL (ref 0.3–1.2)

## 2013-08-12 LAB — URINALYSIS, ROUTINE W REFLEX MICROSCOPIC
BILIRUBIN URINE: NEGATIVE
Glucose, UA: NEGATIVE mg/dL
HGB URINE DIPSTICK: NEGATIVE
Ketones, ur: NEGATIVE mg/dL
Leukocytes, UA: NEGATIVE
NITRITE: NEGATIVE
Protein, ur: NEGATIVE mg/dL
SPECIFIC GRAVITY, URINE: 1.017 (ref 1.005–1.030)
Urobilinogen, UA: 0.2 mg/dL (ref 0.0–1.0)
pH: 6.5 (ref 5.0–8.0)

## 2013-08-12 LAB — PROTIME-INR
INR: 2.08 — ABNORMAL HIGH (ref 0.00–1.49)
PROTHROMBIN TIME: 22.7 s — AB (ref 11.6–15.2)

## 2013-08-12 LAB — I-STAT TROPONIN, ED: Troponin i, poc: 0.21 ng/mL (ref 0.00–0.08)

## 2013-08-12 LAB — MRSA PCR SCREENING: MRSA by PCR: POSITIVE — AB

## 2013-08-12 LAB — CK: Total CK: 183 U/L (ref 7–232)

## 2013-08-12 MED ORDER — MORPHINE SULFATE 4 MG/ML IJ SOLN
4.0000 mg | Freq: Once | INTRAMUSCULAR | Status: AC
  Administered 2013-08-12: 4 mg via INTRAVENOUS
  Filled 2013-08-12: qty 1

## 2013-08-12 MED ORDER — OXYCODONE HCL 5 MG PO TABS: 10.0000 mg | ORAL_TABLET | ORAL | Status: DC | PRN

## 2013-08-12 MED ORDER — MIDAZOLAM HCL 2 MG/2ML IJ SOLN
INTRAMUSCULAR | Status: AC
  Filled 2013-08-12: qty 4

## 2013-08-12 MED ORDER — DOCUSATE SODIUM 100 MG PO CAPS
100.0000 mg | ORAL_CAPSULE | Freq: Two times a day (BID) | ORAL | Status: DC
  Administered 2013-08-12 – 2013-08-17 (×11): 100 mg via ORAL
  Filled 2013-08-12 (×11): qty 1

## 2013-08-12 MED ORDER — KCL IN DEXTROSE-NACL 20-5-0.45 MEQ/L-%-% IV SOLN
INTRAVENOUS | Status: DC
  Administered 2013-08-12: 22:00:00 via INTRAVENOUS
  Filled 2013-08-12 (×3): qty 1000

## 2013-08-12 MED ORDER — ONDANSETRON HCL 4 MG PO TABS: 4.0000 mg | ORAL_TABLET | Freq: Four times a day (QID) | ORAL | Status: DC | PRN

## 2013-08-12 MED ORDER — PANTOPRAZOLE SODIUM 40 MG PO TBEC
40.0000 mg | DELAYED_RELEASE_TABLET | Freq: Every day | ORAL | Status: DC
  Administered 2013-08-12: 40 mg via ORAL
  Filled 2013-08-12: qty 1

## 2013-08-12 MED ORDER — HYDROMORPHONE HCL PF 1 MG/ML IJ SOLN: 1.0000 mg | INTRAMUSCULAR | Status: DC | PRN

## 2013-08-12 MED ORDER — CLONIDINE HCL 0.2 MG PO TABS
0.2000 mg | ORAL_TABLET | Freq: Two times a day (BID) | ORAL | Status: DC
  Administered 2013-08-12 – 2013-08-20 (×16): 0.2 mg via ORAL
  Filled 2013-08-12 (×18): qty 1

## 2013-08-12 MED ORDER — MIDAZOLAM HCL 5 MG/5ML IJ SOLN
INTRAMUSCULAR | Status: AC | PRN
  Administered 2013-08-12: 1 mg via INTRAVENOUS

## 2013-08-12 MED ORDER — DEXTROSE-NACL 5-0.45 % IV SOLN: INTRAVENOUS | Status: DC

## 2013-08-12 MED ORDER — OXYCODONE HCL 5 MG PO TABS: 5.0000 mg | ORAL_TABLET | ORAL | Status: DC | PRN

## 2013-08-12 MED ORDER — LEVOTHYROXINE SODIUM 25 MCG PO TABS
25.0000 ug | ORAL_TABLET | Freq: Every day | ORAL | Status: DC
  Administered 2013-08-13 – 2013-08-20 (×8): 25 ug via ORAL
  Filled 2013-08-12 (×11): qty 1

## 2013-08-12 MED ORDER — ONDANSETRON HCL 4 MG/2ML IJ SOLN
4.0000 mg | Freq: Four times a day (QID) | INTRAMUSCULAR | Status: DC | PRN
  Administered 2013-08-12: 4 mg via INTRAVENOUS
  Filled 2013-08-12 (×2): qty 2

## 2013-08-12 MED ORDER — FENTANYL CITRATE 0.05 MG/ML IJ SOLN
INTRAMUSCULAR | Status: AC
  Filled 2013-08-12: qty 4

## 2013-08-12 MED ORDER — POLYETHYLENE GLYCOL 3350 17 G PO PACK
17.0000 g | PACK | Freq: Every day | ORAL | Status: DC
  Administered 2013-08-13 – 2013-08-20 (×8): 17 g via ORAL
  Filled 2013-08-12 (×9): qty 1

## 2013-08-12 MED ORDER — TRAMADOL HCL 50 MG PO TABS
50.0000 mg | ORAL_TABLET | Freq: Four times a day (QID) | ORAL | Status: DC | PRN
  Administered 2013-08-15 – 2013-08-18 (×6): 100 mg via ORAL
  Administered 2013-08-19 (×2): 50 mg via ORAL
  Filled 2013-08-12 (×4): qty 2
  Filled 2013-08-12 (×2): qty 1
  Filled 2013-08-12 (×2): qty 2

## 2013-08-12 MED ORDER — DILTIAZEM HCL 100 MG IV SOLR
5.0000 mg/h | INTRAVENOUS | Status: DC
  Administered 2013-08-12: 10 mg/h via INTRAVENOUS
  Administered 2013-08-12: 5 mg/h via INTRAVENOUS
  Administered 2013-08-13: 10 mg/h via INTRAVENOUS
  Filled 2013-08-12: qty 100

## 2013-08-12 MED ORDER — MORPHINE SULFATE 2 MG/ML IJ SOLN
2.0000 mg | INTRAMUSCULAR | Status: DC | PRN
  Filled 2013-08-12: qty 1

## 2013-08-12 MED ORDER — PANTOPRAZOLE SODIUM 40 MG PO TBEC: 40.0000 mg | DELAYED_RELEASE_TABLET | Freq: Every day | ORAL | Status: DC

## 2013-08-12 MED ORDER — PANTOPRAZOLE SODIUM 40 MG IV SOLR
40.0000 mg | Freq: Every day | INTRAVENOUS | Status: DC
  Administered 2013-08-13: 40 mg via INTRAVENOUS
  Filled 2013-08-12 (×4): qty 40

## 2013-08-12 MED ORDER — FENTANYL CITRATE 0.05 MG/ML IJ SOLN
INTRAMUSCULAR | Status: AC | PRN
  Administered 2013-08-12: 50 ug via INTRAVENOUS

## 2013-08-12 MED ORDER — ONDANSETRON HCL 4 MG/2ML IJ SOLN: 4.0000 mg | Freq: Four times a day (QID) | INTRAMUSCULAR | Status: DC | PRN

## 2013-08-12 MED ORDER — DILTIAZEM HCL 25 MG/5ML IV SOLN
10.0000 mg | Freq: Once | INTRAVENOUS | Status: AC
  Administered 2013-08-12: 10 mg via INTRAVENOUS
  Filled 2013-08-12: qty 5

## 2013-08-12 MED ORDER — PANTOPRAZOLE SODIUM 40 MG IV SOLR: 40.0000 mg | Freq: Every day | INTRAVENOUS | Status: DC

## 2013-08-12 MED ORDER — ADULT MULTIVITAMIN W/MINERALS CH
1.0000 | ORAL_TABLET | Freq: Every day | ORAL | Status: DC
  Administered 2013-08-13 – 2013-08-20 (×8): 1 via ORAL
  Filled 2013-08-12 (×8): qty 1

## 2013-08-12 MED ORDER — DOCUSATE SODIUM 100 MG PO CAPS: 100.0000 mg | ORAL_CAPSULE | Freq: Two times a day (BID) | ORAL | Status: DC

## 2013-08-12 NOTE — ED Notes (Signed)
Son in [place]: Evan Perez [phone]

## 2013-08-12 NOTE — ED Notes (Signed)
PT remains AO x 4. Reports pain is now 8/10 to right side of chest as well as 5/10 to lower back. Pt is 95% on 3L.

## 2013-08-12 NOTE — Consult Note (Signed)
Cardiology Consultation Note  Patient ID: Evan Perez, MRN: [id number], DOB/AGE: Jun 13, 1922 80 y.o. Admit date: 08/12/2013   Date of Consult: 08/12/2013 Primary Physician: Nyoka Cowden, MD Primary Cardiologist: Mare Ferrari  Chief Complaint: fall Reason for Consult: elevated POC troponin (0.21) and elevated rates in chronic AF  HPI: Evan Perez is an 80 y/o M with history of severe AS s/p bioprosthetic AVR in 2011 with mild nonobstructive CAD by cath at that time, chronic AF on Coumadin, endovascular repair of AAA/L iliac aneurysm [id number], chronic diastolic CHF, mod-severe TR who presented to [facility] with a fall. He has had issues with volume overload since aortic surgery in 01/2013. At last OV 07/2013, Lasix was increased to 120mg  oral Lasix BID. He had been doing OK on this, however, over the last few days he's found that he's had to urinate multiple times an hour. He got up to go to the bathroom early this Perez. While turning around to go back to the other room, he says he lost his balance and fell to the ground. He denies dizziness, palpitations, syncope. He was unable to get up. He laid there until a home health aide found him upon arrival 4 hours later. Due to severe back pain and right side pain he was brought to the ER. He denies CP but does report exertional SOB recently. He does not do much activity anymore. LEE has improved. He lives in independent living over near the [place]. This is fall #4 since January, all mechanical sounding.  CT shows multiple rib fractures as well as multiple acute vertebral compression fractures, trace R pneumothorax, and moderate right pleural effusion. He is given 2 units FFP with CT insertion for hemothorax. CT head was nonacute. He was initially in RVR with rates 140s-160s. With initiation of cardizem, HR has improved to the 90s. Labwork reveals anemia with Hgb 8.8 (previously 10.6), BUN of 91, Cr of 1.73 (baseline 2-2.2),  and POC troponin of 0.21. He was hypertensive on arrival, but had normal oxygen. He denies any current sx except some back discomfort.  Past Medical History  Diagnosis Date  . History of aortic stenosis     a. Severe s/p bovine pericardial tissue AVR 04/2010.  Marland Kitchen Hypertension   . Fall 2011    FRACTURE VERTEBRAL  . Chronic diastolic CHF (congestive heart failure)     a. EF 50-55% by echo 05/2013.  Marland Kitchen Permanent atrial fibrillation     On chronic Coumadin anticoagulation  . Cancer   . CAD (coronary artery disease)     a. Nonobstructive by 2011 cath.  . Hyperlipidemia   . Complication of anesthesia     "drowsy for 2 days after OR" (01/26/2013)  . AAA (abdominal aortic aneurysm)     a. 01/2013: endovascular repair of abdominal aortic and left iliac aneurysm.  . Iliac artery aneurysm, left     a. 01/2013: endovascular repair of abdominal aortic and left iliac aneurysm.  . CKD (chronic kidney disease), stage III     a. Baseline Cr 2-2.2.  . Tricuspid regurgitation     a. Mod-severe TR by echo 05/2013.      Most Recent Cardiac Studies: 2D Echo 05/2013 - Left ventricle: The cavity size was normal. Wall thickness was increased in a pattern of moderate LVH. Systolic function was normal. The estimated ejection fraction was in the range of 50% to 55%. Wall motion was normal; there were no regional wall motion abnormalities. - Aortic valve: A  bioprosthesis was present. - Mitral valve: Calcified annulus. Mild regurgitation. - Left atrium: The atrium was moderately dilated. - Right atrium: The atrium was moderately to severely dilated. - Tricuspid valve: Moderate-severe regurgitation.   03/2010 Cath INDICATIONS FOR PROCEDURE:  The patient is an 80 year old white male who presents with symptoms of progressive shortness of breath and edema consistent with congestive heart failure.  Echocardiogram demonstrates evidence of severe aortic stenosis with a valve area of 0.77 cm2 and a mean  gradient of 36 mmHg.  He has a low ejection fraction of 30-35%. PROCEDURE:  Right and left heart catheterization, coronary and left ventricular angiography. ACCESS:  Via the right femoral artery and vein using standard Seldinger technique. EQUIPMENT:  A 5-French 4 cm left Judkins catheter, 5-French left Amplatz 1 catheter, 5-French pigtail catheter, 5-French arterial sheath, 7- French venous sheath, 7-French balloon-tip Swan-Ganz catheter. CONTRAST:  90 mL of Omnipaque. MEDICATIONS:  Local anesthesia 1% Xylocaine, Versed 1 mg IV and fentanyl 25 mcg IV. HEMODYNAMIC DATA:  Right atrial pressure is 15/18 with a mean of 16 mmHg, right ventricular pressure 69 with an EDP of 14 mmHg.  Pulmonary capillary wedge pressure is 69/26 with a mean of 41 mmHg.  Pulmonary capillary wedge pressure is 29/31 with a mean of 28 mmHg.  Aortic pressure was 160/91 with a mean of 118 mmHg.  Left ventricular pressure was 180 with an EDP of 28 mmHg.  There was no significant mitral valve gradient.  The mean aortic valve gradient was 19 mmHg with an aortic valve area of 1 cm2.  Cardiac output by Fick was 3.93 liters per minute with an index of 2.02, by thermodilution output was 4.24 with an index of 2.17. ANGIOGRAPHIC DATA:  Left ventricular angiography was performed in the RAO view.  This demonstrates global left ventricular hypokinesia with ejection fraction of 35%.  There was mild diffuse aortic root enlargement.  The aortic valve was immobile and calcified. The left main coronary artery was short without significant disease. The left anterior descending artery had 20-30% narrowing at the ostium and in the proximal vessel as well as in the midvessel. The left circumflex coronary artery had 30% disease in the midvessel. The right coronary artery had a 30% disease in the proximal midvessel. FINAL INTERPRETATION: 1. Severe aortic stenosis. 2. Nonobstructive coronary artery disease. 3. Moderate left ventricular  dysfunction. 4. Severe pulmonary hypertension with elevated left ventricular  filling pressures. PLAN:  Would consider the patient for aortic valve surgery.    Surgical History:  Past Surgical History  Procedure Laterality Date  . Aortic valve replacement      NOV 2011-TISSUE VALVE, 23 mm Mitroflow aortic pericardial heart valve  . Cardiac catheterization      03/2010-non obstructive CAD; severe AS  . Vasectomy    . Tonsillectomy    . Cardiac valve replacement    . Embolization      coil embolization of the left hypogastric artery prior to endovascular repair/notes 01/26/2013  . Cataract extraction w/ intraocular lens  implant, bilateral Bilateral     "right ~ 1999; left ~ 2012" (01/26/2013)  . Abdominal aortic endovascular stent graft N/A 01/29/2013    Procedure: ABDOMINAL AORTIC ENDOVASCULAR STENT GRAFT - GORE; ULTRASOUND GUIDED;  Surgeon: Serafina Mitchell, MD;  Location: [facility] OR;  Service: Vascular;  Laterality: N/A;     Home Meds: Prior to Admission medications   Medication Sig Start Date End Date Taking? Authorizing Provider  aspirin 81 MG tablet Take 81 mg by  mouth every Monday, Wednesday, and Friday.    Yes Historical Provider, MD  azelastine (ASTELIN) 137 MCG/SPRAY nasal spray Place 1 spray into the nose 2 (two) times daily as needed. Use in each nostril as directed   Yes Historical Provider, MD  betamethasone valerate (VALISONE) 0.1 % cream Apply topically 2 (two) times daily.   Yes Historical Provider, MD  cloNIDine (CATAPRES) 0.1 MG tablet Take 0.2 mg by mouth 2 (two) times daily.   Yes Historical Provider, MD  furosemide (LASIX) 40 MG tablet Take 120 mg by mouth 2 (two) times daily.   Yes Historical Provider, MD  guaiFENesin (MUCINEX) 600 MG 12 hr tablet Take 600 mg by mouth 2 (two) times daily as needed for congestion. For congestion.   Yes Historical Provider, MD  hydrALAZINE (APRESOLINE) 25 MG tablet Take 25 mg by mouth 3 (three) times daily.   Yes Historical Provider, MD    levothyroxine (SYNTHROID, LEVOTHROID) 25 MCG tablet Take 25 mcg by mouth daily before breakfast.   Yes Historical Provider, MD  lisinopril (PRINIVIL,ZESTRIL) 10 MG tablet Take 10 mg by mouth daily.   Yes Historical Provider, MD  Multiple Vitamins-Iron (MULTIVITAMIN/IRON PO) Take by mouth daily.   Yes Historical Provider, MD  OVER THE COUNTER MEDICATION Place 1 spray into both nostrils daily as needed (for allergies). neilmed nasal wash   Yes Historical Provider, MD  traZODone (DESYREL) 50 MG tablet Take 25-50 mg by mouth at bedtime as needed for sleep.   Yes Historical Provider, MD    Inpatient Medications:  . fentaNYL      . midazolam       . diltiazem (CARDIZEM) infusion 10 mg/hr (08/12/13 1421)    Allergies:  Allergies  Allergen Reactions  . Dust Mite Extract   . Keflex [Cephalexin]   . Lipitor [Atorvastatin Calcium] Other (See Comments)    Nervousness     . Losartan     Blurred vision, trouble sleeping  . Metolazone Nausea Only  . Ramipril Other (See Comments)    Pt cannot remember reaction   . Zocor [Simvastatin] Other (See Comments)    Nervousness   . Lovenox [Enoxaparin Sodium] Rash    Rash on abdomen, back, lower legs.     History   Social History  . Marital Status: Widowed    Spouse Name: N/A    Number of Children: N/A  . Years of Education: N/A   Occupational History  . Not on file.   Social History Main Topics  . Smoking status: Former Smoker -- 0.75 packs/day for 40 years    Types: Cigarettes  . Smokeless tobacco: Never Used     Comment: 01/26/2013 "stopped smoking for good in ~ 1985"  . Alcohol Use: 1.2 oz/week    2 Glasses of wine per week  . Drug Use: No  . Sexual Activity: Not Currently   Other Topics Concern  . Not on file   Social History Narrative  . No narrative on file     Family History  Problem Relation Age of Onset  . Hypertension Mother   . Heart disease Mother   . Heart attack Mother   . Hypertension Father   . Heart disease  Father   . Heart disease Brother      Review of Systems: General: negative for chills, fever, night sweats or weight changes  Cardiovascular: no orthopnea Dermatological: negative for rash Respiratory: negative for cough or wheezing Urologic: negative for hematuria Abdominal: negative for nausea, vomiting, diarrhea, bright  red blood per rectum, melena, or hematemesis Neurologic: negative for visual changes, syncope, or dizziness All other systems reviewed and are otherwise negative except as noted above.  Labs: POC troponin neg x 1 Lab Results  Component Value Date   WBC 8.9 08/12/2013   HGB 8.8* 08/12/2013   HCT 27.9* 08/12/2013   MCV 88.6 08/12/2013   PLT 184 08/12/2013    Recent Labs Lab 08/12/13 1155  NA 137  K 4.6  CL 93*  CO2 30  BUN 91*  CREATININE 1.73*  CALCIUM 8.6  PROT 7.9  BILITOT 0.6  ALKPHOS 106  ALT 25  AST 56*  GLUCOSE 121*   Lab Results  Component Value Date   CHOL 140 03/19/2013   HDL 41.60 03/19/2013   LDLCALC 89 03/19/2013   TRIG 47.0 03/19/2013    Radiology/Studies:  Ct Abdomen Pelvis Wo Contrast  08/12/2013   CLINICAL DATA Fall.  Trauma.  Lower back pain.  History of L1 fracture.  EXAM CT CHEST, ABDOMEN AND PELVIS WITHOUT CONTRAST  TECHNIQUE Multidetector CT imaging of the chest, abdomen and pelvis was performed following the standard protocol without IV contrast.  COMPARISON 03/09/2013 abdominal CT.  FINDINGS CT CHEST FINDINGS  THORACIC INLET/BODY WALL:  No acute abnormality.  MEDIASTINUM:  Cardiomegaly. No pericardial effusion. Previous aortic valvular replacement. Extensive coronary artery atherosclerosis. No evidence of acute aortic injury. No suspicious adenopathy.  LUNG WINDOWS:  There is airspace disease in the right lower lobe. Small to moderate right pleural effusion which is layering water density. Trace left pleural effusion. There is a tiny pneumothorax in the ventral and lower right chest.  OSSEOUS:  See below  CT ABDOMEN AND PELVIS  FINDINGS  Liver: No evidence of injury.  Biliary: No evidence of biliary obstruction or stone.  Pancreas: Unremarkable.  Spleen: Minimal fluid around the spleen, which is low-density.  Adrenals: Unremarkable.  Kidneys and ureters: No evidence of injury. Bilateral atrophy. No hydronephrosis.  Bladder: Unremarkable.  Reproductive: Unremarkable.  Bowel: No wall thickening.  No obstruction  Retroperitoneum: No hematoma.  Peritoneum: Small volume free fluid around the spleen and in the pelvis. The fluid is water density. Pelvic fluid noted on previous examination.  Vascular: Status post aorto bi-iliac stent graft. Coil embolization of the left hypogastric artery. Unchanged or smaller size of the left iliac artery aneurysm sac, 4 cm in diameter. Unchanged size of the aortic aneurysm sac, approximately 4 cm in maximal diameter. 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  OSSEOUS: Acute fractures of the right fourth, fifth, sixth, seventh, eighth, ninth ribs. There is up to 100% displacement inferiorly.  Multiple spinal fractures:  L5: Horizontal low-density cleft through the upper body. Compression is maximal posteriorly, approximately 25%. No subluxation.  L4: Inferior endplate fracture which is new from prior and likely acute.  L3: Superior endplate compression fracture with height loss less than 50%. There is minimal bony retropulsion, without significant canal narrowing.  L1: Remote compression fracture. Exaggerated kyphosis at this level.  T12: Remote appearing Schmorl's node in the superior endplate with surrounding sclerosis.  T3: Horizontal fracture through the upper body with mild compression. This fracture appears acute.  There is no evidence of spinal subluxation or acute posterior element fractures.  Critical Value/emergent results were called by telephone at the time of interpretation on 08/12/2013 at 2:36 PM to Dr. Shirlyn Goltz , who verbally acknowledged these results.   IMPRESSION 1. Acute right fourth through ninth  rib fractures. 2. Acute compression type vertebral fractures of L5, L4, L3, T12, and T3. No spinal subluxation or significant bony retropulsion. 3. Trace right pneumothorax. 4. Moderate right pleural effusion which is primarily non hemorrhagic. There is a combination of atelectasis and pneumonia/aspiration in the right lower lobe. 5. Small volume fluid around the spleen without visible parenchymal abnormality; cannot exclude splenic injury without contrast. 6. Small volume pelvic ascites, chronic from October 2014. 7. Non-traumatic findings noted above.  SIGNATURE  Electronically Signed   By: Jorje Guild M.D.   On: 08/12/2013 14:36   Dg Chest 1 View  08/12/2013   CLINICAL DATA Fall  EXAM CHEST - 1 VIEW  COMPARISON 01/29/2013  FINDINGS Cardiac enlargement with vascular congestion and mild fluid overload. Small right pleural effusion has developed since the prior study. Mild bibasilar atelectasis. Underlying COPD.  Fractures of the right sixth and eighth and ninth ribs.  IMPRESSION Right rib fractures. Small right effusion may be due to heart failure or hemothorax.  Cardiac enlargement with mild fluid overload.  SIGNATURE  Electronically Signed   By: Franchot Gallo M.D.   On: 08/12/2013 12:47   Dg Lumbar Spine Complete  08/12/2013   CLINICAL DATA Fall  EXAM LUMBAR SPINE - COMPLETE 4+ VIEW  COMPARISON 03/09/2013  FINDINGS Aortoiliac stent graft in satisfactory position. Coils in the left hypogastric artery.  Chronic compression fractures of T12 and L1 are unchanged. There are new endplate deformities at L3, L4, and L5 consistent with recent fractures. MRI may be helpful for further evaluation of the spinal canal and to date these fractures.  IMPRESSION Mild fractures at L3, L4, and L5 not present on 03/09/2013 and possibly acute. Chronic fractures T12 and L1.  SIGNATURE  Electronically Signed   By: Franchot Gallo M.D.   On: 08/12/2013 12:50   Dg Pelvis 1-2  Views  08/12/2013   CLINICAL DATA Fall  EXAM PELVIS - 1-2 VIEW  COMPARISON None.  FINDINGS Negative for fracture in the pelvis. Both hips show mild degenerative change. Aortoiliac stent graft. Coils in the left hypogastric artery.  IMPRESSION Negative for fracture.  SIGNATURE  Electronically Signed   By: Franchot Gallo M.D.   On: 08/12/2013 12:47   Ct Head Wo Contrast  08/12/2013   CLINICAL DATA Patient on ground for 4 hr. Back pain. Patient fell this Perez at 6 o'clock.  EXAM CT HEAD WITHOUT CONTRAST  CT CERVICAL SPINE WITHOUT CONTRAST  TECHNIQUE Multidetector CT imaging of the head and cervical spine was performed following the standard protocol without intravenous contrast. Multiplanar CT image reconstructions of the cervical spine were also generated.  COMPARISON CT HEAD W/O CM dated 06/11/2013  FINDINGS CT HEAD FINDINGS  There is no evidence of mass effect, midline shift, or extra-axial fluid collections. There is no evidence of a space-occupying lesion or intracranial hemorrhage. There is no evidence of a cortical-based area of acute infarction. There is generalized cerebral atrophy. There is periventricular white matter low attenuation likely secondary to microangiopathy.  The ventricles and sulci are appropriate for the patient's age. The basal cisterns are patent.  Visualized portions of the orbits are unremarkable. The visualized portions of the paranasal sinuses and mastoid air cells are unremarkable. Cerebrovascular atherosclerotic calcifications are noted.  The osseous structures are unremarkable.  CT CERVICAL SPINE FINDINGS  The alignment is anatomic. The vertebral body heights are maintained. There is no acute fracture. There is no static listhesis. The prevertebral soft tissues are normal. The intraspinal soft tissues are  not fully imaged on this examination due to poor soft tissue contrast, but there is no gross soft tissue abnormality.  There is degenerative disc disease at C4-5, C5-6 and C6-7.  There broad-based disc osteophyte complexes that C3-4, C4-5, C5-6 and C6-7. There is bilateral uncovertebral degenerative changes C5-6 resulting in foraminal encroachment.  The visualized portions of the lung apices demonstrate no focal abnormality.  IMPRESSION 1. No acute intracranial pathology. 2. No acute osseous injury cervical spine. 3. Cervical spine spondylosis as described above. Evaluation the cervical spine is somewhat limited as the C7 and T1 levels are excluded from the field of view.  SIGNATURE  Electronically Signed   By: Kathreen Devoid   On: 08/12/2013 13:46   Ct Chest Wo Contrast  08/12/2013   CLINICAL DATA Fall.  Trauma.  Lower back pain.  History of L1 fracture.  EXAM CT CHEST, ABDOMEN AND PELVIS WITHOUT CONTRAST  TECHNIQUE Multidetector CT imaging of the chest, abdomen and pelvis was performed following the standard protocol without IV contrast.  COMPARISON 03/09/2013 abdominal CT.  FINDINGS CT CHEST FINDINGS  THORACIC INLET/BODY WALL:  No acute abnormality.  MEDIASTINUM:  Cardiomegaly. No pericardial effusion. Previous aortic valvular replacement. Extensive coronary artery atherosclerosis. No evidence of acute aortic injury. No suspicious adenopathy.  LUNG WINDOWS:  There is airspace disease in the right lower lobe. Small to moderate right pleural effusion which is layering water density. Trace left pleural effusion. There is a tiny pneumothorax in the ventral and lower right chest.  OSSEOUS:  See below  CT ABDOMEN AND PELVIS FINDINGS  Liver: No evidence of injury.  Biliary: No evidence of biliary obstruction or stone.  Pancreas: Unremarkable.  Spleen: Minimal fluid around the spleen, which is low-density.  Adrenals: Unremarkable.  Kidneys and ureters: No evidence of injury. Bilateral atrophy. No hydronephrosis.  Bladder: Unremarkable.  Reproductive: Unremarkable.  Bowel: No wall thickening.  No obstruction  Retroperitoneum: No hematoma.  Peritoneum: Small volume free fluid around the spleen and  in the pelvis. The fluid is water density. Pelvic fluid noted on previous examination.  Vascular: Status post aorto bi-iliac stent graft. Coil embolization of the left hypogastric artery. Unchanged or smaller size of the left iliac artery aneurysm sac, 4 cm in diameter. Unchanged size of the aortic aneurysm sac, approximately 4 cm in maximal diameter. 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  OSSEOUS: Acute fractures of the right fourth, fifth, sixth, seventh, eighth, ninth ribs. There is up to 100% displacement inferiorly.  Multiple spinal fractures:  L5: Horizontal low-density cleft through the upper body. Compression is maximal posteriorly, approximately 25%. No subluxation.  L4: Inferior endplate fracture which is new from prior and likely acute.  L3: Superior endplate compression fracture with height loss less than 50%. There is minimal bony retropulsion, without significant canal narrowing.  L1: Remote compression fracture. Exaggerated kyphosis at this level.  T12: Remote appearing Schmorl's node in the superior endplate with surrounding sclerosis.  T3: Horizontal fracture through the upper body with mild compression. This fracture appears acute.  There is no evidence of spinal subluxation or acute posterior element fractures.  Critical Value/emergent results were called by telephone at the time of interpretation on 08/12/2013 at 2:36 PM to Dr. Shirlyn Goltz , who verbally acknowledged these results.  IMPRESSION 1. Acute right fourth through ninth rib fractures. 2. Acute compression type vertebral fractures of L5, L4, L3, T12, and T3. No spinal subluxation or significant bony retropulsion.  3. Trace right pneumothorax. 4. Moderate right pleural effusion which is primarily non hemorrhagic. There is a combination of atelectasis and pneumonia/aspiration in the right lower lobe. 5. Small volume fluid around the spleen without visible parenchymal abnormality; cannot exclude  splenic injury without contrast. 6. Small volume pelvic ascites, chronic from October 2014. 7. Non-traumatic findings noted above.  SIGNATURE  Electronically Signed   By: Jorje Guild M.D.   On: 08/12/2013 14:36   Ct Cervical Spine Wo Contrast  08/12/2013   CLINICAL DATA Patient on ground for 4 hr. Back pain. Patient fell this Perez at 6 o'clock.  EXAM CT HEAD WITHOUT CONTRAST  CT CERVICAL SPINE WITHOUT CONTRAST  TECHNIQUE Multidetector CT imaging of the head and cervical spine was performed following the standard protocol without intravenous contrast. Multiplanar CT image reconstructions of the cervical spine were also generated.  COMPARISON CT HEAD W/O CM dated 06/11/2013  FINDINGS CT HEAD FINDINGS  There is no evidence of mass effect, midline shift, or extra-axial fluid collections. There is no evidence of a space-occupying lesion or intracranial hemorrhage. There is no evidence of a cortical-based area of acute infarction. There is generalized cerebral atrophy. There is periventricular white matter low attenuation likely secondary to microangiopathy.  The ventricles and sulci are appropriate for the patient's age. The basal cisterns are patent.  Visualized portions of the orbits are unremarkable. The visualized portions of the paranasal sinuses and mastoid air cells are unremarkable. Cerebrovascular atherosclerotic calcifications are noted.  The osseous structures are unremarkable.  CT CERVICAL SPINE FINDINGS  The alignment is anatomic. The vertebral body heights are maintained. There is no acute fracture. There is no static listhesis. The prevertebral soft tissues are normal. The intraspinal soft tissues are not fully imaged on this examination due to poor soft tissue contrast, but there is no gross soft tissue abnormality.  There is degenerative disc disease at C4-5, C5-6 and C6-7. There broad-based disc osteophyte complexes that C3-4, C4-5, C5-6 and C6-7. There is bilateral uncovertebral degenerative  changes C5-6 resulting in foraminal encroachment.  The visualized portions of the lung apices demonstrate no focal abnormality.  IMPRESSION 1. No acute intracranial pathology. 2. No acute osseous injury cervical spine. 3. Cervical spine spondylosis as described above. Evaluation the cervical spine is somewhat limited as the C7 and T1 levels are excluded from the field of view.  SIGNATURE  Electronically Signed   By: Kathreen Devoid   On: 08/12/2013 13:46    EKG: atrial fib 140, LAFB, old anterior infarct, LVH with secondary repol abnormality, significant ST depression V5 with TWI, less prolonged in V6  Physical Exam: Blood pressure 158/75, pulse 99, temperature 97.8 F (36.6 C), temperature source Oral, resp. rate 16, SpO2 96.00%. General: Well developed thin WM in no acute distress. Head: Normocephalic, atraumatic, sclera non-icteric, no xanthomas, nares are without discharge.  Neck: JVD not elevated. Lungs: Clear bilaterally to auscultation without wheezes, rales, or rhonchi. Breathing is unlabored. Heart: Irregular, borderline tachy with S1 S2. Crisp valve sound. 2/6 SEM LSB. No significant rubs or gallops appreciated. Abdomen: Soft, non-tender, non-distended with normoactive bowel sounds. No hepatomegaly. No rebound/guarding. No obvious abdominal masses. Msk:  Generalized muscular atrophy noted. Extremities: No clubbing or cyanosis. Bilateral LE have circumferential compressive dressings with what appears to be erythema beneath. Neuro: Alert and oriented X 3. No facial asymmetry. No focal deficit. Moves all extremities spontaneously. Psych:  Responds to questions appropriately with a normal affect.   Assessment and Plan:   1. Mechanical fall  with recent frequent falls, and subsequent multiple rib fractures and vertebral compression fractures 2. R hemothorax s/p chest tube 3. Chronic atrial fibrillation with elevated heart rate on admission 4. Mildly elevated POC troponin with mild  nonobstructive CAD by cath 2011 5. Chronic diastolic CHF due to hypertensive heart disease, primarily RHF 6. HTN 7. PVD s/p AAA/iliac aneurysm repair 01/2013 8. CKD stage III-IV, elevated BUN on admission suggestive of dehydration 9. Hypoalbuminemia 10. Anemia  Plan to continue dilt drip for now - can change to PO tomorrow if he remains stable on this. Agree with gentle hydration. Hold Lasix/Lisinopril while we watch Cr from today's event. Continue clonidine so we avoid rebound hypertension. Follow daily weights, I&Os closely. It is not clear with such a significant fall with resultant multiple fractures and hemothorax that he is a good Coumadin candidate from here on out. This is fall #4 for him this year. Would hold off on continuing anticoagulation for now and we can discuss with patient's primary cardiologist. Mildly elevated POC troponin may be due to demand ischemia in the setting of AF RVR and dehydration - there are no plans to pursue further at present. He denies any chest pain with today's episode. Would recommend to resume daily aspirin when OK with trauma - tomorrow if stable. Will follow with you.  Signed, Melina Copa PA-C 08/12/2013, 3:13 PM  Personally seen and examined, agree with above. Patient of Dr. Mare Ferrari on chronic anticoagulation with recent falls. Current fall has resulted in hemothorax and several fractures. Troponin was mildly positive in the setting of atrial fibrillation with rapid ventricular response on admission (demand ischemia likely). EKG demonstrates ST segment depression especially in lateral leads during rapid ventricular response. Chest pain-free.  I agree with plan to continue IV diltiazem with conversion to by mouth hopefully tomorrow. He was not on rate controlling medications at home so watch for bradycardia.  We will resume most of his antihypertensives. Monitor blood pressure closely.  Decision on resuming anticoagulation will need to be made and  currently with his history of falls, fall today resulting in hemothorax, I would lean toward not resuming anticoagulation. This can be discussed further with Dr. Mare Ferrari. Of course, without anticoagulation, he would be at increased risk for stroke.  Try to resume aspirin whenever possible given his abdominal aortic aneurysm/iliac repair history.  We will follow along. Candee Furbish, MD

## 2013-08-12 NOTE — ED Provider Notes (Signed)
CSN: [id number]     Arrival date & time 08/12/13  1120 History   First MD Initiated Contact with Patient 08/12/13 1127     Chief Complaint  Patient presents with  . Fall  . Atrial Fibrillation     (Consider location/radiation/quality/duration/timing/severity/associated sxs/prior Treatment) The history is provided by the patient.  Evan Perez is a 80 y.o. male history A. fib on Coumadin, CHF, hypertension here presenting with dizziness and fall and back pain. He was in the shower today and felt dizzy and fell to the ground and was unable to get up. 4 hours later he was found on the floor by the neighbor. Denies any syncope but felt lightheaded dizzy prior to the event. Does not remember if he hit his head or not but is currently on Coumadin.    Level V caveat- condition of patient   Past Medical History  Diagnosis Date  . History of aortic stenosis     Severe s/p bovine pericardial tissue AVR 04/2010  . Hypertension   . Fall 2011    FRACTURE VERTEBRAL  . CHF (congestive heart failure)     LV DYSFUNCTION -EF 55% JAN 2012  . Permanent atrial fibrillation     On chronic Coumadin anticoagulation  . Cancer   . CAD (coronary artery disease)     Nonobstructive by 2011 cath  . Hyperlipidemia   . Complication of anesthesia     "drowsy for 2 days after OR" (01/26/2013)  . AAA (abdominal aortic aneurysm)     AAA/left iliac aneurysm (3.9 cm)/notes 01/26/2013   Past Surgical History  Procedure Laterality Date  . Aortic valve replacement      NOV 2011-TISSUE VALVE, 23 mm Mitroflow aortic pericardial heart valve  . Cardiac catheterization      03/2010-non obstructive CAD; severe AS  . Vasectomy    . Tonsillectomy    . Cardiac valve replacement    . Embolization      coil embolization of the left hypogastric artery prior to endovascular repair/notes 01/26/2013  . Cataract extraction w/ intraocular lens  implant, bilateral Bilateral     "right ~ 1999; left ~ 2012" (01/26/2013)  .  Abdominal aortic endovascular stent graft N/A 01/29/2013    Procedure: ABDOMINAL AORTIC ENDOVASCULAR STENT GRAFT - GORE; ULTRASOUND GUIDED;  Surgeon: Serafina Mitchell, MD;  Location: [facility] OR;  Service: Vascular;  Laterality: N/A;   Family History  Problem Relation Age of Onset  . Hypertension Mother   . Heart disease Mother   . Heart attack Mother   . Hypertension Father   . Heart disease Father   . Heart disease Brother    History  Substance Use Topics  . Smoking status: Former Smoker -- 0.75 packs/day for 40 years    Types: Cigarettes  . Smokeless tobacco: Never Used     Comment: 01/26/2013 "stopped smoking for good in ~ 1985"  . Alcohol Use: 1.2 oz/week    2 Glasses of wine per week    Review of Systems  Musculoskeletal: Positive for back pain.  Neurological: Positive for dizziness.  All other systems reviewed and are negative.      Allergies  Dust mite extract; Keflex; Lipitor; Losartan; Metolazone; Ramipril; Zocor; and Lovenox  Home Medications   Current Outpatient Rx  Name  Route  Sig  Dispense  Refill  . aspirin 81 MG tablet   Oral   Take 81 mg by mouth every Monday, Wednesday, and Friday.          Marland Kitchen  azelastine (ASTELIN) 137 MCG/SPRAY nasal spray   Nasal   Place 1 spray into the nose 2 (two) times daily as needed. Use in each nostril as directed         . betamethasone valerate (VALISONE) 0.1 % cream   Topical   Apply topically 2 (two) times daily.         . cloNIDine (CATAPRES) 0.1 MG tablet   Oral   Take 0.2 mg by mouth 2 (two) times daily.         . furosemide (LASIX) 40 MG tablet   Oral   Take 120 mg by mouth 2 (two) times daily.         Marland Kitchen guaiFENesin (MUCINEX) 600 MG 12 hr tablet   Oral   Take 600 mg by mouth 2 (two) times daily as needed for congestion. For congestion.         . hydrALAZINE (APRESOLINE) 25 MG tablet   Oral   Take 25 mg by mouth 3 (three) times daily.         Marland Kitchen levothyroxine (SYNTHROID, LEVOTHROID) 25 MCG tablet    Oral   Take 25 mcg by mouth daily before breakfast.         . lisinopril (PRINIVIL,ZESTRIL) 10 MG tablet   Oral   Take 10 mg by mouth daily.         . Multiple Vitamins-Iron (MULTIVITAMIN/IRON PO)   Oral   Take by mouth daily.         Marland Kitchen OVER THE COUNTER MEDICATION   Each Nare   Place 1 spray into both nostrils daily as needed (for allergies). neilmed nasal wash         . traZODone (DESYREL) 50 MG tablet   Oral   Take 25-50 mg by mouth at bedtime as needed for sleep.          BP 159/92  Pulse 110  Temp(Src) 98.2 F (36.8 C) (Oral)  Resp 18  SpO2 97% Physical Exam  Nursing note and vitals reviewed. Constitutional: He is oriented to person, place, and time.  Uncomfortable, chronically ill   HENT:  Head: Normocephalic.  Mouth/Throat: Oropharynx is clear and moist.  Eyes: Conjunctivae are normal. Pupils are equal, round, and reactive to light.  Neck: Normal range of motion. Neck supple.  Cardiovascular: Normal heart sounds.   Tachycardic, irregular   Pulmonary/Chest: Effort normal and breath sounds normal. No respiratory distress. He has no wheezes. He has no rales.  Abdominal: Soft. Bowel sounds are normal. He exhibits no distension. There is no tenderness. There is no rebound.  Musculoskeletal:  ? Lower lumbar tenderness. Moving bilateral hips. No other signs of extremity trauma.   Neurological: He is alert and oriented to person, place, and time. No cranial nerve deficit.  Skin: Skin is warm and dry.  Psychiatric: He has a normal mood and affect. His behavior is normal. Judgment and thought content normal.    ED Course  Procedures (including critical care time)  CRITICAL CARE Performed by: Darl Householder, Glorious Flicker   Total critical care time: 30 min   Critical care time was exclusive of separately billable procedures and treating other patients.  Critical care was necessary to treat or prevent imminent or life-threatening deterioration.  Critical care was time  spent personally by me on the following activities: development of treatment plan with patient and/or surrogate as well as nursing, discussions with consultants, evaluation of patient's response to treatment, examination of patient, obtaining history from patient or surrogate,  ordering and performing treatments and interventions, ordering and review of laboratory studies, ordering and review of radiographic studies, pulse oximetry and re-evaluation of patient's condition.   Labs Review Labs Reviewed  CBC WITH DIFFERENTIAL - Abnormal; Notable for the following:    RBC 3.15 (*)    Hemoglobin 8.8 (*)    HCT 27.9 (*)    RDW 20.1 (*)    Neutrophils Relative % 85 (*)    Lymphocytes Relative 4 (*)    Lymphs Abs 0.3 (*)    All other components within normal limits  COMPREHENSIVE METABOLIC PANEL - Abnormal; Notable for the following:    Chloride 93 (*)    Glucose, Bld 121 (*)    BUN 91 (*)    Creatinine, Ser 1.73 (*)    Albumin 2.6 (*)    AST 56 (*)    GFR calc non Af Amer 33 (*)    GFR calc Af Amer 39 (*)    All other components within normal limits  PROTIME-INR - Abnormal; Notable for the following:    Prothrombin Time 22.7 (*)    INR 2.08 (*)    All other components within normal limits  I-STAT TROPOININ, ED - Abnormal; Notable for the following:    Troponin i, poc 0.21 (*)    All other components within normal limits  URINALYSIS, ROUTINE W REFLEX MICROSCOPIC  CK  PREPARE FRESH FROZEN PLASMA   Imaging Review Ct Abdomen Pelvis Wo Contrast  08/12/2013   CLINICAL DATA Fall.  Trauma.  Lower back pain.  History of L1 fracture.  EXAM CT CHEST, ABDOMEN AND PELVIS WITHOUT CONTRAST  TECHNIQUE Multidetector CT imaging of the chest, abdomen and pelvis was performed following the standard protocol without IV contrast.  COMPARISON 03/09/2013 abdominal CT.  FINDINGS CT CHEST FINDINGS  THORACIC INLET/BODY WALL:  No acute abnormality.  MEDIASTINUM:  Cardiomegaly. No pericardial effusion. Previous  aortic valvular replacement. Extensive coronary artery atherosclerosis. No evidence of acute aortic injury. No suspicious adenopathy.  LUNG WINDOWS:  There is airspace disease in the right lower lobe. Small to moderate right pleural effusion which is layering water density. Trace left pleural effusion. There is a tiny pneumothorax in the ventral and lower right chest.  OSSEOUS:  See below  CT ABDOMEN AND PELVIS FINDINGS  Liver: No evidence of injury.  Biliary: No evidence of biliary obstruction or stone.  Pancreas: Unremarkable.  Spleen: Minimal fluid around the spleen, which is low-density.  Adrenals: Unremarkable.  Kidneys and ureters: No evidence of injury. Bilateral atrophy. No hydronephrosis.  Bladder: Unremarkable.  Reproductive: Unremarkable.  Bowel: No wall thickening.  No obstruction  Retroperitoneum: No hematoma.  Peritoneum: Small volume free fluid around the spleen and in the pelvis. The fluid is water density. Pelvic fluid noted on previous examination.  Vascular: Status post aorto bi-iliac stent graft. Coil embolization of the left hypogastric artery. Unchanged or smaller size of the left iliac artery aneurysm sac, 4 cm in diameter. Unchanged size of the aortic aneurysm sac, approximately 4 cm in maximal diameter. 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  OSSEOUS: Acute fractures of the right fourth, fifth, sixth, seventh, eighth, ninth ribs. There is up to 100% displacement inferiorly.  Multiple spinal fractures:  L5: Horizontal low-density cleft through the upper body. Compression is maximal posteriorly, approximately 25%. No subluxation.  L4: Inferior endplate fracture which is new from prior and likely acute.  L3: Superior endplate compression fracture with height  loss less than 50%. There is minimal bony retropulsion, without significant canal narrowing.  L1: Remote compression fracture. Exaggerated kyphosis at this level.  T12: Remote appearing  Schmorl's node in the superior endplate with surrounding sclerosis.  T3: Horizontal fracture through the upper body with mild compression. This fracture appears acute.  There is no evidence of spinal subluxation or acute posterior element fractures.  Critical Value/emergent results were called by telephone at the time of interpretation on 08/12/2013 at 2:36 PM to Dr. Shirlyn Goltz , who verbally acknowledged these results.  IMPRESSION 1. Acute right fourth through ninth rib fractures. 2. Acute compression type vertebral fractures of L5, L4, L3, T12, and T3. No spinal subluxation or significant bony retropulsion. 3. Trace right pneumothorax. 4. Moderate right pleural effusion which is primarily non hemorrhagic. There is a combination of atelectasis and pneumonia/aspiration in the right lower lobe. 5. Small volume fluid around the spleen without visible parenchymal abnormality; cannot exclude splenic injury without contrast. 6. Small volume pelvic ascites, chronic from October 2014. 7. Non-traumatic findings noted above.  SIGNATURE  Electronically Signed   By: Jorje Guild M.D.   On: 08/12/2013 14:36   Dg Chest 1 View  08/12/2013   CLINICAL DATA Fall  EXAM CHEST - 1 VIEW  COMPARISON 01/29/2013  FINDINGS Cardiac enlargement with vascular congestion and mild fluid overload. Small right pleural effusion has developed since the prior study. Mild bibasilar atelectasis. Underlying COPD.  Fractures of the right sixth and eighth and ninth ribs.  IMPRESSION Right rib fractures. Small right effusion may be due to heart failure or hemothorax.  Cardiac enlargement with mild fluid overload.  SIGNATURE  Electronically Signed   By: Franchot Gallo M.D.   On: 08/12/2013 12:47   Dg Lumbar Spine Complete  08/12/2013   CLINICAL DATA Fall  EXAM LUMBAR SPINE - COMPLETE 4+ VIEW  COMPARISON 03/09/2013  FINDINGS Aortoiliac stent graft in satisfactory position. Coils in the left hypogastric artery.  Chronic compression fractures of T12 and  L1 are unchanged. There are new endplate deformities at L3, L4, and L5 consistent with recent fractures. MRI may be helpful for further evaluation of the spinal canal and to date these fractures.  IMPRESSION Mild fractures at L3, L4, and L5 not present on 03/09/2013 and possibly acute. Chronic fractures T12 and L1.  SIGNATURE  Electronically Signed   By: Franchot Gallo M.D.   On: 08/12/2013 12:50   Dg Pelvis 1-2 Views  08/12/2013   CLINICAL DATA Fall  EXAM PELVIS - 1-2 VIEW  COMPARISON None.  FINDINGS Negative for fracture in the pelvis. Both hips show mild degenerative change. Aortoiliac stent graft. Coils in the left hypogastric artery.  IMPRESSION Negative for fracture.  SIGNATURE  Electronically Signed   By: Franchot Gallo M.D.   On: 08/12/2013 12:47   Ct Head Wo Contrast  08/12/2013   CLINICAL DATA Patient on ground for 4 hr. Back pain. Patient fell this morning at 6 o'clock.  EXAM CT HEAD WITHOUT CONTRAST  CT CERVICAL SPINE WITHOUT CONTRAST  TECHNIQUE Multidetector CT imaging of the head and cervical spine was performed following the standard protocol without intravenous contrast. Multiplanar CT image reconstructions of the cervical spine were also generated.  COMPARISON CT HEAD W/O CM dated 06/11/2013  FINDINGS CT HEAD FINDINGS  There is no evidence of mass effect, midline shift, or extra-axial fluid collections. There is no evidence of a space-occupying lesion or intracranial hemorrhage. There is no evidence of a cortical-based area of acute infarction.  There is generalized cerebral atrophy. There is periventricular white matter low attenuation likely secondary to microangiopathy.  The ventricles and sulci are appropriate for the patient's age. The basal cisterns are patent.  Visualized portions of the orbits are unremarkable. The visualized portions of the paranasal sinuses and mastoid air cells are unremarkable. Cerebrovascular atherosclerotic calcifications are noted.  The osseous structures are  unremarkable.  CT CERVICAL SPINE FINDINGS  The alignment is anatomic. The vertebral body heights are maintained. There is no acute fracture. There is no static listhesis. The prevertebral soft tissues are normal. The intraspinal soft tissues are not fully imaged on this examination due to poor soft tissue contrast, but there is no gross soft tissue abnormality.  There is degenerative disc disease at C4-5, C5-6 and C6-7. There broad-based disc osteophyte complexes that C3-4, C4-5, C5-6 and C6-7. There is bilateral uncovertebral degenerative changes C5-6 resulting in foraminal encroachment.  The visualized portions of the lung apices demonstrate no focal abnormality.  IMPRESSION 1. No acute intracranial pathology. 2. No acute osseous injury cervical spine. 3. Cervical spine spondylosis as described above. Evaluation the cervical spine is somewhat limited as the C7 and T1 levels are excluded from the field of view.  SIGNATURE  Electronically Signed   By: Kathreen Devoid   On: 08/12/2013 13:46   Ct Chest Wo Contrast  08/12/2013   CLINICAL DATA Fall.  Trauma.  Lower back pain.  History of L1 fracture.  EXAM CT CHEST, ABDOMEN AND PELVIS WITHOUT CONTRAST  TECHNIQUE Multidetector CT imaging of the chest, abdomen and pelvis was performed following the standard protocol without IV contrast.  COMPARISON 03/09/2013 abdominal CT.  FINDINGS CT CHEST FINDINGS  THORACIC INLET/BODY WALL:  No acute abnormality.  MEDIASTINUM:  Cardiomegaly. No pericardial effusion. Previous aortic valvular replacement. Extensive coronary artery atherosclerosis. No evidence of acute aortic injury. No suspicious adenopathy.  LUNG WINDOWS:  There is airspace disease in the right lower lobe. Small to moderate right pleural effusion which is layering water density. Trace left pleural effusion. There is a tiny pneumothorax in the ventral and lower right chest.  OSSEOUS:  See below  CT ABDOMEN AND PELVIS FINDINGS  Liver: No evidence of injury.  Biliary: No  evidence of biliary obstruction or stone.  Pancreas: Unremarkable.  Spleen: Minimal fluid around the spleen, which is low-density.  Adrenals: Unremarkable.  Kidneys and ureters: No evidence of injury. Bilateral atrophy. No hydronephrosis.  Bladder: Unremarkable.  Reproductive: Unremarkable.  Bowel: No wall thickening.  No obstruction  Retroperitoneum: No hematoma.  Peritoneum: Small volume free fluid around the spleen and in the pelvis. The fluid is water density. Pelvic fluid noted on previous examination.  Vascular: Status post aorto bi-iliac stent graft. Coil embolization of the left hypogastric artery. Unchanged or smaller size of the left iliac artery aneurysm sac, 4 cm in diameter. Unchanged size of the aortic aneurysm sac, approximately 4 cm in maximal diameter. 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  OSSEOUS: Acute fractures of the right fourth, fifth, sixth, seventh, eighth, ninth ribs. There is up to 100% displacement inferiorly.  Multiple spinal fractures:  L5: Horizontal low-density cleft through the upper body. Compression is maximal posteriorly, approximately 25%. No subluxation.  L4: Inferior endplate fracture which is new from prior and likely acute.  L3: Superior endplate compression fracture with height loss less than 50%. There is minimal bony retropulsion, without significant canal narrowing.  L1: Remote compression fracture. Exaggerated kyphosis at  this level.  T12: Remote appearing Schmorl's node in the superior endplate with surrounding sclerosis.  T3: Horizontal fracture through the upper body with mild compression. This fracture appears acute.  There is no evidence of spinal subluxation or acute posterior element fractures.  Critical Value/emergent results were called by telephone at the time of interpretation on 08/12/2013 at 2:36 PM to Dr. Shirlyn Goltz , who verbally acknowledged these results.  IMPRESSION 1. Acute right fourth through ninth rib  fractures. 2. Acute compression type vertebral fractures of L5, L4, L3, T12, and T3. No spinal subluxation or significant bony retropulsion. 3. Trace right pneumothorax. 4. Moderate right pleural effusion which is primarily non hemorrhagic. There is a combination of atelectasis and pneumonia/aspiration in the right lower lobe. 5. Small volume fluid around the spleen without visible parenchymal abnormality; cannot exclude splenic injury without contrast. 6. Small volume pelvic ascites, chronic from October 2014. 7. Non-traumatic findings noted above.  SIGNATURE  Electronically Signed   By: Jorje Guild M.D.   On: 08/12/2013 14:36   Ct Cervical Spine Wo Contrast  08/12/2013   CLINICAL DATA Patient on ground for 4 hr. Back pain. Patient fell this morning at 6 o'clock.  EXAM CT HEAD WITHOUT CONTRAST  CT CERVICAL SPINE WITHOUT CONTRAST  TECHNIQUE Multidetector CT imaging of the head and cervical spine was performed following the standard protocol without intravenous contrast. Multiplanar CT image reconstructions of the cervical spine were also generated.  COMPARISON CT HEAD W/O CM dated 06/11/2013  FINDINGS CT HEAD FINDINGS  There is no evidence of mass effect, midline shift, or extra-axial fluid collections. There is no evidence of a space-occupying lesion or intracranial hemorrhage. There is no evidence of a cortical-based area of acute infarction. There is generalized cerebral atrophy. There is periventricular white matter low attenuation likely secondary to microangiopathy.  The ventricles and sulci are appropriate for the patient's age. The basal cisterns are patent.  Visualized portions of the orbits are unremarkable. The visualized portions of the paranasal sinuses and mastoid air cells are unremarkable. Cerebrovascular atherosclerotic calcifications are noted.  The osseous structures are unremarkable.  CT CERVICAL SPINE FINDINGS  The alignment is anatomic. The vertebral body heights are maintained. There is  no acute fracture. There is no static listhesis. The prevertebral soft tissues are normal. The intraspinal soft tissues are not fully imaged on this examination due to poor soft tissue contrast, but there is no gross soft tissue abnormality.  There is degenerative disc disease at C4-5, C5-6 and C6-7. There broad-based disc osteophyte complexes that C3-4, C4-5, C5-6 and C6-7. There is bilateral uncovertebral degenerative changes C5-6 resulting in foraminal encroachment.  The visualized portions of the lung apices demonstrate no focal abnormality.  IMPRESSION 1. No acute intracranial pathology. 2. No acute osseous injury cervical spine. 3. Cervical spine spondylosis as described above. Evaluation the cervical spine is somewhat limited as the C7 and T1 levels are excluded from the field of view.  SIGNATURE  Electronically Signed   By: Kathreen Devoid   On: 08/12/2013 13:46     EKG Interpretation   Date/Time:  Wednesday August 12 2013 11:28:11 EDT Ventricular Rate:  140 PR Interval:    QRS Duration: 98 QT Interval:  328 QTC Calculation: 501 R Axis:   -80 Text Interpretation:  Atrial fibrillation Left anterior fascicular block  LVH with secondary repolarization abnormality Anterior infarct, old  Prolonged QT interval Since last tracing rate faster Confirmed by Ules Marsala  MD,  Devlin Brink ([id number]) on 08/12/2013 12:13:07 PM  MDM   Final diagnoses:  Hemothorax  Rib fracture  Atrial fibrillation with RVR  NSTEMI (non-ST elevated myocardial infarction)  Evan Perez is a 80 y.o. male here with rapid afib, fall, back pain. Will do CT head/neck, xrays. Will give cardizem to treat rapid afib. Will likely need admission.   1pm Xray showed rib fractures, hemothorax. Possible spinal fractures. I consulted trauma for eval.   1:20pm Still in rapid afib after cardizem. Will start cardizem drip. Trop positive. However, given hemothorax and therapeutic INR, will hold heparin and ASA for now. Will consult  cardiology.    2:40 PM Trauma evaluated, will place chest tube. Will admit. Cardiology will consult. Will admit for hemothorax, rib fractures, NSTEMI, rapid afib, possible splenic injury.     Wandra Arthurs, MD 08/12/13 [phone]

## 2013-08-12 NOTE — ED Notes (Signed)
Trauma PA at bedside. 

## 2013-08-12 NOTE — ED Notes (Signed)
PT from home where he lives independently. Reports that he was in the restroom and fell. Denies dizziness or LOC, but states "I just lost my balance." Pt reports he was laying on the ground for appx 4 hours before he was discovered by his home health services. Pt now reports lower back pain. Denies CP. PEERLA. Pt is AO x4, speech clear. Neuro intact.   Pt has coverings to bilateral lower legs dt recent peripheral vascular surgery. MD at bedside assessing. Pt is able to move all extremities, but reports pain to lower back with leg movement. Pt has bruising to bilateral arms with scabs. Pt takes Coumadin. Pt is denying any chest pain.   Pt has central abdominal hernia. Denies pain on palpation No bruising noted to abdomen. Pelvis stable. Lung sounds heard bilaterally, right lower lung diminished.

## 2013-08-12 NOTE — Procedures (Signed)
Chest Tube Insertion Procedure Note right   Indications:  Clinically significant Hemothorax after fall today.   Pre-operative Diagnosis: Hemothorax  Post-operative Diagnosis: Hemothorax  Procedure Details  Informed consent was obtained for the procedure, including sedation.  Risks of lung perforation, hemorrhage, arrhythmia, and adverse drug reaction were discussed.  Observation discussed.   After sterile skin prep, using standard technique, a 32 French tube was placed in the right lateral 6th  rib space. Secured with 0 silk times two.   Findings: 200 ml of serosanguinous fluid obtained  Estimated Blood Loss:  200 mL         Specimens:  None              Complications:  None; patient tolerated the procedure well.         Disposition: stable in ED.  To step down.          Condition: stable  Attending Attestation: I performed the procedure.

## 2013-08-12 NOTE — ED Notes (Signed)
Per EMS: Pt fell this AM at 0600, laid on ground for appx 4 hours before found by neighbor. Pt reports lower back pain, hx L1 fracture. PT AO x4, currently on blood thinner. PERRLA. A fib rate 112-180's, hx of A fib. Given 250 mcg fentanyl. 187/109. 95% RA.

## 2013-08-12 NOTE — ED Notes (Signed)
Cardiology at bedside.

## 2013-08-12 NOTE — ED Notes (Signed)
Results of troponin given to Dr. Darl Householder

## 2013-08-12 NOTE — H&P (Signed)
Evan Perez is an 80 y.o. male.   Chief Complaint: Fall HPI: Evan Perez got up from the toilet this morning after urinating and fell, landing on his right side. He denied syncope, presyncope, dizziness, or loss of consciousness. He was unable to get up and lie on the floor for ~4 hours. He came in for evaluation to the ED c/o severe lower right chest pain and some back pain.  Past Medical History  Diagnosis Date  . History of aortic stenosis     Severe s/p bovine pericardial tissue AVR 04/2010  . Hypertension   . Fall 2011    FRACTURE VERTEBRAL  . CHF (congestive heart failure)     LV DYSFUNCTION -EF 55% JAN 2012  . Permanent atrial fibrillation     On chronic Coumadin anticoagulation  . Cancer   . CAD (coronary artery disease)     Nonobstructive by 2011 cath  . Hyperlipidemia   . Complication of anesthesia     "drowsy for 2 days after OR" (01/26/2013)  . 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 Aortic valve replacement      NOV 2011-TISSUE VALVE, 23 mm Mitroflow aortic pericardial heart valve  . Cardiac catheterization      03/2010-non obstructive CAD; severe AS  . Vasectomy    . Tonsillectomy    . Cardiac valve replacement    . Embolization      coil embolization of the left hypogastric artery prior to endovascular repair/notes 01/26/2013  . Cataract extraction w/ intraocular lens  implant, bilateral Bilateral     "right ~ 1999; left ~ 2012" (01/26/2013)  . Abdominal aortic endovascular stent graft N/A 01/29/2013    Procedure: ABDOMINAL AORTIC ENDOVASCULAR STENT GRAFT - GORE; ULTRASOUND GUIDED;  Surgeon: Serafina Mitchell, MD;  Location: [facility] OR;  Service: Vascular;  Laterality: N/A;    Family History  Problem Relation Age of Onset  . Hypertension Mother   . Heart disease Mother   . Heart attack Mother   . Hypertension Father   . Heart disease Father   . Heart disease Brother     Social History:  reports that he has quit smoking. His smoking use included Cigarettes. He has a 30 pack-year smoking history. He has never used smokeless tobacco. He reports that he drinks about 1.2 ounces of alcohol per week. He reports that he does not use illicit drugs.  Allergies:  Allergies  Allergen Reactions  . Dust Mite Extract   . Keflex [Cephalexin]   . Lipitor [Atorvastatin Calcium] Other (See Comments)    Nervousness     . Losartan     Blurred vision, trouble sleeping  . Metolazone Nausea Only  . Ramipril Other (See Comments)    Pt cannot remember reaction   . Zocor [Simvastatin] Other (See Comments)    Nervousness   . Lovenox [Enoxaparin Sodium] Rash    Rash on abdomen, back, lower legs.     Results for orders placed during the hospital encounter of 08/12/13 (from the past 48 hour(s))  URINALYSIS, ROUTINE W REFLEX MICROSCOPIC     Status: None   Collection Time    08/12/13 11:50 AM      Result Value Ref Range   Color, Urine YELLOW  YELLOW   APPearance CLEAR  CLEAR   Specific Gravity, Urine 1.017  1.005 - 1.030   pH 6.5  5.0 - 8.0  Glucose, UA NEGATIVE  NEGATIVE mg/dL   Hgb urine dipstick NEGATIVE  NEGATIVE   Bilirubin Urine NEGATIVE  NEGATIVE   Ketones, ur NEGATIVE  NEGATIVE mg/dL   Protein, ur NEGATIVE  NEGATIVE mg/dL   Urobilinogen, UA 0.2  0.0 - 1.0 mg/dL   Nitrite NEGATIVE  NEGATIVE   Leukocytes, UA NEGATIVE  NEGATIVE   Comment: MICROSCOPIC NOT DONE ON URINES WITH NEGATIVE PROTEIN, BLOOD, LEUKOCYTES, NITRITE, OR GLUCOSE <1000 mg/dL.  CBC WITH DIFFERENTIAL     Status: Abnormal   Collection Time    08/12/13 11:55 AM      Result Value Ref Range   WBC 8.9  4.0 - 10.5 K/uL   RBC 3.15 (*) 4.22 - 5.81 MIL/uL   Hemoglobin 8.8 (*) 13.0 - 17.0 g/dL   HCT 27.9 (*) 39.0 - 52.0 %   MCV 88.6  78.0 - 100.0 fL   MCH 27.9  26.0 - 34.0 pg   MCHC 31.5  30.0 - 36.0 g/dL   RDW 20.1 (*) 11.5 - 15.5 %   Platelets 184  150 - 400 K/uL   Neutrophils Relative % 85 (*)  43 - 77 %   Neutro Abs 7.6  1.7 - 7.7 K/uL   Lymphocytes Relative 4 (*) 12 - 46 %   Lymphs Abs 0.3 (*) 0.7 - 4.0 K/uL   Monocytes Relative 11  3 - 12 %   Monocytes Absolute 1.0  0.1 - 1.0 K/uL   Eosinophils Relative 0  0 - 5 %   Eosinophils Absolute 0.0  0.0 - 0.7 K/uL   Basophils Relative 0  0 - 1 %   Basophils Absolute 0.0  0.0 - 0.1 K/uL  COMPREHENSIVE METABOLIC PANEL     Status: Abnormal   Collection Time    08/12/13 11:55 AM      Result Value Ref Range   Sodium 137  137 - 147 mEq/L   Potassium 4.6  3.7 - 5.3 mEq/L   Comment: HEMOLYSIS AT THIS LEVEL MAY AFFECT RESULT   Chloride 93 (*) 96 - 112 mEq/L   CO2 30  19 - 32 mEq/L   Glucose, Bld 121 (*) 70 - 99 mg/dL   BUN 91 (*) 6 - 23 mg/dL   Creatinine, Ser 1.73 (*) 0.50 - 1.35 mg/dL   Calcium 8.6  8.4 - 10.5 mg/dL   Total Protein 7.9  6.0 - 8.3 g/dL   Albumin 2.6 (*) 3.5 - 5.2 g/dL   AST 56 (*) 0 - 37 U/L   Comment: HEMOLYSIS AT THIS LEVEL MAY AFFECT RESULT   ALT 25  0 - 53 U/L   Comment: HEMOLYSIS AT THIS LEVEL MAY AFFECT RESULT   Alkaline Phosphatase 106  39 - 117 U/L   Total Bilirubin 0.6  0.3 - 1.2 mg/dL   GFR calc non Af Amer 33 (*) >90 mL/min   GFR calc Af Amer 39 (*) >90 mL/min   Comment: (NOTE)     The eGFR has been calculated using the CKD EPI equation.     This calculation has not been validated in all clinical situations.     eGFR's persistently <90 mL/min signify possible Chronic Kidney     Disease.  PROTIME-INR     Status: Abnormal   Collection Time    08/12/13 11:55 AM      Result Value Ref Range   Prothrombin Time 22.7 (*) 11.6 - 15.2 seconds   INR 2.08 (*) 0.00 - 1.49  I-STAT TROPOININ, ED  Status: Abnormal   Collection Time    08/12/13 12:06 PM      Result Value Ref Range   Troponin i, poc 0.21 (*) 0.00 - 0.08 ng/mL   Comment NOTIFIED PHYSICIAN     Comment 3            Comment: Due to the release kinetics of cTnI,     a negative result within the first hours     of the onset of symptoms does  not rule out     myocardial infarction with certainty.     If myocardial infarction is still suspected,     repeat the test at appropriate intervals.  PREPARE FRESH FROZEN PLASMA     Status: None   Collection Time    08/12/13  2:15 PM      Result Value Ref Range   Unit Number [id number]     Blood Component Type THAWED PLASMA     Unit division 00     Status of Unit ALLOCATED     Transfusion Status OK TO TRANSFUSE     Unit Number [id number]     Blood Component Type THAWED PLASMA     Unit division 00     Status of Unit ALLOCATED     Transfusion Status OK TO TRANSFUSE     Dg Chest 1 View  08/12/2013   CLINICAL DATA Fall  EXAM CHEST - 1 VIEW  COMPARISON 01/29/2013  FINDINGS Cardiac enlargement with vascular congestion and mild fluid overload. Small right pleural effusion has developed since the prior study. Mild bibasilar atelectasis. Underlying COPD.  Fractures of the right sixth and eighth and ninth ribs.  IMPRESSION Right rib fractures. Small right effusion may be due to heart failure or hemothorax.  Cardiac enlargement with mild fluid overload.  SIGNATURE  Electronically Signed   By: Franchot Gallo M.D.   On: 08/12/2013 12:47   Dg Lumbar Spine Complete  08/12/2013   CLINICAL DATA Fall  EXAM LUMBAR SPINE - COMPLETE 4+ VIEW  COMPARISON 03/09/2013  FINDINGS Aortoiliac stent graft in satisfactory position. Coils in the left hypogastric artery.  Chronic compression fractures of T12 and L1 are unchanged. There are new endplate deformities at L3, L4, and L5 consistent with recent fractures. MRI may be helpful for further evaluation of the spinal canal and to date these fractures.  IMPRESSION Mild fractures at L3, L4, and L5 not present on 03/09/2013 and possibly acute. Chronic fractures T12 and L1.  SIGNATURE  Electronically Signed   By: Franchot Gallo M.D.   On: 08/12/2013 12:50   Dg Pelvis 1-2 Views  08/12/2013   CLINICAL DATA Fall  EXAM PELVIS - 1-2 VIEW  COMPARISON None.  FINDINGS  Negative for fracture in the pelvis. Both hips show mild degenerative change. Aortoiliac stent graft. Coils in the left hypogastric artery.  IMPRESSION Negative for fracture.  SIGNATURE  Electronically Signed   By: Franchot Gallo M.D.   On: 08/12/2013 12:47   Ct Head Wo Contrast  08/12/2013   CLINICAL DATA Patient on ground for 4 hr. Back pain. Patient fell this morning at 6 o'clock.  EXAM CT HEAD WITHOUT CONTRAST  CT CERVICAL SPINE WITHOUT CONTRAST  TECHNIQUE Multidetector CT imaging of the head and cervical spine was performed following the standard protocol without intravenous contrast. Multiplanar CT image reconstructions of the cervical spine were also generated.  COMPARISON CT HEAD W/O CM dated 06/11/2013  FINDINGS CT HEAD FINDINGS  There is no evidence of mass effect,  midline shift, or extra-axial fluid collections. There is no evidence of a space-occupying lesion or intracranial hemorrhage. There is no evidence of a cortical-based area of acute infarction. There is generalized cerebral atrophy. There is periventricular white matter low attenuation likely secondary to microangiopathy.  The ventricles and sulci are appropriate for the patient's age. The basal cisterns are patent.  Visualized portions of the orbits are unremarkable. The visualized portions of the paranasal sinuses and mastoid air cells are unremarkable. Cerebrovascular atherosclerotic calcifications are noted.  The osseous structures are unremarkable.  CT CERVICAL SPINE FINDINGS  The alignment is anatomic. The vertebral body heights are maintained. There is no acute fracture. There is no static listhesis. The prevertebral soft tissues are normal. The intraspinal soft tissues are not fully imaged on this examination due to poor soft tissue contrast, but there is no gross soft tissue abnormality.  There is degenerative disc disease at C4-5, C5-6 and C6-7. There broad-based disc osteophyte complexes that C3-4, C4-5, C5-6 and C6-7. There is  bilateral uncovertebral degenerative changes C5-6 resulting in foraminal encroachment.  The visualized portions of the lung apices demonstrate no focal abnormality.  IMPRESSION 1. No acute intracranial pathology. 2. No acute osseous injury cervical spine. 3. Cervical spine spondylosis as described above. Evaluation the cervical spine is somewhat limited as the C7 and T1 levels are excluded from the field of view.  SIGNATURE  Electronically Signed   By: Kathreen Devoid   On: 08/12/2013 13:46    Review of Systems  Constitutional: Negative for weight loss.  HENT: Negative for ear discharge, ear pain, hearing loss and tinnitus.   Eyes: Negative for blurred vision, double vision, photophobia and pain.  Respiratory: Negative for cough, sputum production and shortness of breath.   Cardiovascular: Positive for chest pain.  Gastrointestinal: Negative for nausea, vomiting and abdominal pain.  Genitourinary: Negative for dysuria, urgency, frequency and flank pain.  Musculoskeletal: Positive for back pain and falls. Negative for joint pain, myalgias and neck pain.  Neurological: Negative for dizziness, tingling, sensory change, focal weakness, loss of consciousness and headaches.  Endo/Heme/Allergies: Does not bruise/bleed easily.  Psychiatric/Behavioral: Negative for depression, memory loss and substance abuse. The patient is not nervous/anxious.     Blood pressure 159/92, pulse 111, temperature 98.2 F (36.8 C), temperature source Oral, resp. rate 18, SpO2 95.00%. Physical Exam  Vitals reviewed. Constitutional: He is oriented to person, place, and time. He appears well-developed and well-nourished. He is cooperative. No distress. Cervical collar and nasal cannula in place.  HENT:  Head: Normocephalic and atraumatic. Head is without raccoon's eyes, without Battle's sign, without abrasion, without contusion and without laceration.  Right Ear: Hearing, tympanic membrane, external ear and ear canal normal. No  lacerations. No drainage or tenderness. No foreign bodies. Tympanic membrane is not perforated. No hemotympanum.  Left Ear: Hearing, tympanic membrane, external ear and ear canal normal. No lacerations. No drainage or tenderness. No foreign bodies. Tympanic membrane is not perforated. No hemotympanum.  Nose: Nose normal. No nose lacerations, sinus tenderness, nasal deformity or nasal septal hematoma. No epistaxis.  Mouth/Throat: Uvula is midline, oropharynx is clear and moist and mucous membranes are normal. No lacerations. No oropharyngeal exudate.  Eyes: Conjunctivae, EOM and lids are normal. Pupils are equal, round, and reactive to light. Right eye exhibits no discharge. Left eye exhibits no discharge. No scleral icterus.  Neck: Trachea normal and normal range of motion. Neck supple. No JVD present. No spinous process tenderness and no muscular tenderness present. Carotid bruit  is not present. No tracheal deviation present. No thyromegaly present.  Cardiovascular: Normal heart sounds, intact distal pulses and normal pulses.  An irregularly irregular rhythm present. Tachycardia present.   Respiratory: Effort normal and breath sounds normal. No stridor. No respiratory distress. He has no wheezes. He has no rales. He exhibits tenderness, bony tenderness and crepitus. He exhibits no laceration.  GI: Soft. Normal appearance and bowel sounds are normal. He exhibits no distension. There is no tenderness. There is no rigidity, no rebound, no guarding and no CVA tenderness.  Genitourinary: Penis normal.  Musculoskeletal: Normal range of motion. He exhibits no edema and no tenderness.  Lymphadenopathy:    He has no cervical adenopathy.  Neurological: He is alert and oriented to person, place, and time. He has normal strength. No cranial nerve deficit or sensory deficit. GCS eye subscore is 4. GCS verbal subscore is 5. GCS motor subscore is 6.  Skin: Skin is warm, dry and intact. He is not diaphoretic.   Psychiatric: He has a normal mood and affect. His speech is normal and behavior is normal.     Assessment/Plan Fall -- Check CK levels, watch for rhabdo Multiple right rib fxs w/HTX -- Will need CT to lower risk of empyema, etc. Dr. Brantley Stage to place. Aggressive pulmonary toilet Afib w/RVR and elevated troponins -- Cardiology to consult Acute on chronic anemia -- Monitor closely Anticoagulated on coumadin -- Will give 2 units FFP with CT insertion but can begin re-anticoagulation after that. Given that this is not his first fall, however, it is worth considering the risk/benefit ratio of continuing. Multiple medical problems -- Low threshold to involve internal medicine to help manage multiple comorbidities.    Lisette Abu, PA-C Pager: [phone] General Trauma PA Pager: [phone] 08/12/2013, 2:21 PM

## 2013-08-12 NOTE — ED Notes (Signed)
Pt cleaned, sheets changed. Pt tolerated well.

## 2013-08-12 NOTE — H&P (Signed)
Pt seen examined and films reviewed.  Needs CT on right.  See note.

## 2013-08-12 NOTE — ED Notes (Signed)
Cardiologist at bedside.  

## 2013-08-12 NOTE — ED Notes (Signed)
Md Darl Householder is at bedside.

## 2013-08-12 NOTE — ED Notes (Signed)
Attempted to call report x 1  

## 2013-08-13 ENCOUNTER — Inpatient Hospital Stay (HOSPITAL_COMMUNITY): Payer: Medicare Other

## 2013-08-13 LAB — COMPREHENSIVE METABOLIC PANEL
ALT: 21 U/L (ref 0–53)
AST: 36 U/L (ref 0–37)
Albumin: 2.3 g/dL — ABNORMAL LOW (ref 3.5–5.2)
Alkaline Phosphatase: 90 U/L (ref 39–117)
BILIRUBIN TOTAL: 0.5 mg/dL (ref 0.3–1.2)
BUN: 93 mg/dL — AB (ref 6–23)
CHLORIDE: 96 meq/L (ref 96–112)
CO2: 33 mEq/L — ABNORMAL HIGH (ref 19–32)
Calcium: 8.4 mg/dL (ref 8.4–10.5)
Creatinine, Ser: 1.7 mg/dL — ABNORMAL HIGH (ref 0.50–1.35)
GFR calc Af Amer: 40 mL/min — ABNORMAL LOW (ref >90)
GFR calc non Af Amer: 34 mL/min — ABNORMAL LOW (ref >90)
Glucose, Bld: 146 mg/dL — ABNORMAL HIGH (ref 70–99)
POTASSIUM: 3.8 meq/L (ref 3.7–5.3)
SODIUM: 141 meq/L (ref 137–147)
Total Protein: 6.7 g/dL (ref 6.0–8.3)

## 2013-08-13 LAB — PREPARE FRESH FROZEN PLASMA
Unit division: 0
Unit division: 0

## 2013-08-13 LAB — CBC
HEMATOCRIT: 22 % — AB (ref 39.0–52.0)
HEMOGLOBIN: 7.2 g/dL — AB (ref 13.0–17.0)
MCH: 29 pg (ref 26.0–34.0)
MCHC: 32.7 g/dL (ref 30.0–36.0)
MCV: 88.7 fL (ref 78.0–100.0)
Platelets: 148 10*3/uL — ABNORMAL LOW (ref 150–400)
RBC: 2.48 MIL/uL — ABNORMAL LOW (ref 4.22–5.81)
RDW: 20.2 % — AB (ref 11.5–15.5)
WBC: 6.8 10*3/uL (ref 4.0–10.5)

## 2013-08-13 LAB — PROTIME-INR
INR: 2.37 — ABNORMAL HIGH (ref 0.00–1.49)
PROTHROMBIN TIME: 25.1 s — AB (ref 11.6–15.2)

## 2013-08-13 LAB — PREPARE RBC (CROSSMATCH)

## 2013-08-13 MED ORDER — VITAMIN K1 10 MG/ML IJ SOLN
5.0000 mg | Freq: Once | INTRAVENOUS | Status: AC
  Administered 2013-08-13: 5 mg via INTRAVENOUS
  Filled 2013-08-13: qty 0.5

## 2013-08-13 MED ORDER — FUROSEMIDE 80 MG PO TABS
120.0000 mg | ORAL_TABLET | Freq: Two times a day (BID) | ORAL | Status: DC
  Administered 2013-08-14: 120 mg via ORAL
  Filled 2013-08-13 (×4): qty 1

## 2013-08-13 MED ORDER — CHLORHEXIDINE GLUCONATE CLOTH 2 % EX PADS
6.0000 | MEDICATED_PAD | Freq: Every day | CUTANEOUS | Status: AC
  Administered 2013-08-13 – 2013-08-17 (×5): 6 via TOPICAL

## 2013-08-13 MED ORDER — MUPIROCIN 2 % EX OINT
1.0000 "application " | TOPICAL_OINTMENT | Freq: Two times a day (BID) | CUTANEOUS | Status: AC
  Administered 2013-08-13 – 2013-08-17 (×10): 1 via NASAL
  Filled 2013-08-13 (×4): qty 22

## 2013-08-13 NOTE — Consult Note (Signed)
Reason for Consult:Compression fractures Referring Physician: Trauma service  Evan Perez is an 80 y.o. male.  HPI: 80 yo male who fell yesterday and was found down on his floor. He was brought to [facility] ED where he was evaluated and found to have multiple mild compression fractures in the lumbar region. Neurosurgical consult requested.  Past Medical History  Diagnosis Date  . History of aortic stenosis     a. Severe s/p bovine pericardial tissue AVR 04/2010.  Marland Kitchen Hypertension   . Fall 2011    FRACTURE VERTEBRAL  . Chronic diastolic CHF (congestive heart failure)     a. EF 50-55% by echo 05/2013.  Marland Kitchen Permanent atrial fibrillation     On chronic Coumadin anticoagulation  . Cancer   . CAD (coronary artery disease)     a. Nonobstructive by 2011 cath.  . Hyperlipidemia   . Complication of anesthesia     "drowsy for 2 days after OR" (01/26/2013)  . AAA (abdominal aortic aneurysm)     a. 01/2013: endovascular repair of abdominal aortic and left iliac aneurysm.  . Iliac artery aneurysm, left     a. 01/2013: endovascular repair of abdominal aortic and left iliac aneurysm.  . CKD (chronic kidney disease), stage III     a. Baseline Cr 2-2.2.  . Tricuspid regurgitation     a. Mod-severe TR by echo 05/2013.    Past Surgical History  Procedure Laterality Date  . Aortic valve replacement      NOV 2011-TISSUE VALVE, 23 mm Mitroflow aortic pericardial heart valve  . Cardiac catheterization      03/2010-non obstructive CAD; severe AS  . Vasectomy    . Tonsillectomy    . Cardiac valve replacement    . Embolization      coil embolization of the left hypogastric artery prior to endovascular repair/notes 01/26/2013  . Cataract extraction w/ intraocular lens  implant, bilateral Bilateral     "right ~ 1999; left ~ 2012" (01/26/2013)  . Abdominal aortic endovascular stent graft N/A 01/29/2013    Procedure: ABDOMINAL AORTIC ENDOVASCULAR STENT GRAFT - GORE; ULTRASOUND GUIDED;  Surgeon: Serafina Mitchell, MD;  Location: [facility] OR;  Service: Vascular;  Laterality: N/A;    Family History  Problem Relation Age of Onset  . Hypertension Mother   . Heart disease Mother   . Heart attack Mother   . Hypertension Father   . Heart disease Father   . Heart disease Brother     Social History:  reports that he has quit smoking. His smoking use included Cigarettes. He has a 30 pack-year smoking history. He has never used smokeless tobacco. He reports that he drinks about 1.2 ounces of alcohol per week. He reports that he does not use illicit drugs.  Allergies:  Allergies  Allergen Reactions  . Dust Mite Extract   . Keflex [Cephalexin]   . Lipitor [Atorvastatin Calcium] Other (See Comments)    Nervousness     . Losartan     Blurred vision, trouble sleeping  . Metolazone Nausea Only  . Ramipril Other (See Comments)    Pt cannot remember reaction   . Zocor [Simvastatin] Other (See Comments)    Nervousness   . Lovenox [Enoxaparin Sodium] Rash    Rash on abdomen, back, lower legs.     Medications: I have reviewed the patient's current medications.  Results for orders placed during the hospital encounter of 08/12/13 (from the past 48 hour(s))  URINALYSIS, ROUTINE W REFLEX MICROSCOPIC  Status: None   Collection Time    08/12/13 11:50 AM      Result Value Ref Range   Color, Urine YELLOW  YELLOW   APPearance CLEAR  CLEAR   Specific Gravity, Urine 1.017  1.005 - 1.030   pH 6.5  5.0 - 8.0   Glucose, UA NEGATIVE  NEGATIVE mg/dL   Hgb urine dipstick NEGATIVE  NEGATIVE   Bilirubin Urine NEGATIVE  NEGATIVE   Ketones, ur NEGATIVE  NEGATIVE mg/dL   Protein, ur NEGATIVE  NEGATIVE mg/dL   Urobilinogen, UA 0.2  0.0 - 1.0 mg/dL   Nitrite NEGATIVE  NEGATIVE   Leukocytes, UA NEGATIVE  NEGATIVE   Comment: MICROSCOPIC NOT DONE ON URINES WITH NEGATIVE PROTEIN, BLOOD, LEUKOCYTES, NITRITE, OR GLUCOSE <1000 mg/dL.  CBC WITH DIFFERENTIAL     Status: Abnormal   Collection Time    08/12/13 11:55 AM       Result Value Ref Range   WBC 8.9  4.0 - 10.5 K/uL   RBC 3.15 (*) 4.22 - 5.81 MIL/uL   Hemoglobin 8.8 (*) 13.0 - 17.0 g/dL   HCT 27.9 (*) 39.0 - 52.0 %   MCV 88.6  78.0 - 100.0 fL   MCH 27.9  26.0 - 34.0 pg   MCHC 31.5  30.0 - 36.0 g/dL   RDW 20.1 (*) 11.5 - 15.5 %   Platelets 184  150 - 400 K/uL   Neutrophils Relative % 85 (*) 43 - 77 %   Neutro Abs 7.6  1.7 - 7.7 K/uL   Lymphocytes Relative 4 (*) 12 - 46 %   Lymphs Abs 0.3 (*) 0.7 - 4.0 K/uL   Monocytes Relative 11  3 - 12 %   Monocytes Absolute 1.0  0.1 - 1.0 K/uL   Eosinophils Relative 0  0 - 5 %   Eosinophils Absolute 0.0  0.0 - 0.7 K/uL   Basophils Relative 0  0 - 1 %   Basophils Absolute 0.0  0.0 - 0.1 K/uL  COMPREHENSIVE METABOLIC PANEL     Status: Abnormal   Collection Time    08/12/13 11:55 AM      Result Value Ref Range   Sodium 137  137 - 147 mEq/L   Potassium 4.6  3.7 - 5.3 mEq/L   Comment: HEMOLYSIS AT THIS LEVEL MAY AFFECT RESULT   Chloride 93 (*) 96 - 112 mEq/L   CO2 30  19 - 32 mEq/L   Glucose, Bld 121 (*) 70 - 99 mg/dL   BUN 91 (*) 6 - 23 mg/dL   Creatinine, Ser 1.73 (*) 0.50 - 1.35 mg/dL   Calcium 8.6  8.4 - 10.5 mg/dL   Total Protein 7.9  6.0 - 8.3 g/dL   Albumin 2.6 (*) 3.5 - 5.2 g/dL   AST 56 (*) 0 - 37 U/L   Comment: HEMOLYSIS AT THIS LEVEL MAY AFFECT RESULT   ALT 25  0 - 53 U/L   Comment: HEMOLYSIS AT THIS LEVEL MAY AFFECT RESULT   Alkaline Phosphatase 106  39 - 117 U/L   Total Bilirubin 0.6  0.3 - 1.2 mg/dL   GFR calc non Af Amer 33 (*) >90 mL/min   GFR calc Af Amer 39 (*) >90 mL/min   Comment: (NOTE)     The eGFR has been calculated using the CKD EPI equation.     This calculation has not been validated in all clinical situations.     eGFR's persistently <90 mL/min signify possible Chronic Kidney  Disease.  PROTIME-INR     Status: Abnormal   Collection Time    08/12/13 11:55 AM      Result Value Ref Range   Prothrombin Time 22.7 (*) 11.6 - 15.2 seconds   INR 2.08 (*) 0.00 - 1.49   I-STAT TROPOININ, ED     Status: Abnormal   Collection Time    08/12/13 12:06 PM      Result Value Ref Range   Troponin i, poc 0.21 (*) 0.00 - 0.08 ng/mL   Comment NOTIFIED PHYSICIAN     Comment 3            Comment: Due to the release kinetics of cTnI,     a negative result within the first hours     of the onset of symptoms does not rule out     myocardial infarction with certainty.     If myocardial infarction is still suspected,     repeat the test at appropriate intervals.  PREPARE FRESH FROZEN PLASMA     Status: None   Collection Time    08/12/13  2:15 PM      Result Value Ref Range   Unit Number [id number]     Blood Component Type THAWED PLASMA     Unit division 00     Status of Unit ISSUED,FINAL     Transfusion Status OK TO TRANSFUSE     Unit Number [id number]     Blood Component Type THAWED PLASMA     Unit division 00     Status of Unit ISSUED,FINAL     Transfusion Status OK TO TRANSFUSE    CK     Status: None   Collection Time    08/12/13  2:38 PM      Result Value Ref Range   Total CK 183  7 - 232 U/L  MRSA PCR SCREENING     Status: Abnormal   Collection Time    08/12/13  6:17 PM      Result Value Ref Range   MRSA by PCR POSITIVE (*) NEGATIVE   Comment:            The GeneXpert MRSA Assay (FDA     approved for NASAL specimens     only), is one component of a     comprehensive MRSA colonization     surveillance program. It is not     intended to diagnose MRSA     infection nor to guide or     monitor treatment for     MRSA infections.     RESULT CALLED TO, READ BACK BY AND VERIFIED WITH:     R BROWN RN 1953 08/12/13 A BROWNING  CBC     Status: Abnormal   Collection Time    08/13/13  2:00 AM      Result Value Ref Range   WBC 6.8  4.0 - 10.5 K/uL   RBC 2.48 (*) 4.22 - 5.81 MIL/uL   Hemoglobin 7.2 (*) 13.0 - 17.0 g/dL   Comment: DELTA CHECK NOTED     REPEATED TO VERIFY     SPECIMEN CHECKED FOR CLOTS   HCT 22.0 (*) 39.0 - 52.0 %   MCV 88.7   78.0 - 100.0 fL   MCH 29.0  26.0 - 34.0 pg   MCHC 32.7  30.0 - 36.0 g/dL   RDW 20.2 (*) 11.5 - 15.5 %   Platelets 148 (*) 150 - 400 K/uL  COMPREHENSIVE METABOLIC PANEL  Status: Abnormal   Collection Time    08/13/13  2:00 AM      Result Value Ref Range   Sodium 141  137 - 147 mEq/L   Potassium 3.8  3.7 - 5.3 mEq/L   Comment: DELTA CHECK NOTED   Chloride 96  96 - 112 mEq/L   CO2 33 (*) 19 - 32 mEq/L   Glucose, Bld 146 (*) 70 - 99 mg/dL   BUN 93 (*) 6 - 23 mg/dL   Creatinine, Ser 1.70 (*) 0.50 - 1.35 mg/dL   Calcium 8.4  8.4 - 10.5 mg/dL   Total Protein 6.7  6.0 - 8.3 g/dL   Albumin 2.3 (*) 3.5 - 5.2 g/dL   AST 36  0 - 37 U/L   ALT 21  0 - 53 U/L   Alkaline Phosphatase 90  39 - 117 U/L   Total Bilirubin 0.5  0.3 - 1.2 mg/dL   GFR calc non Af Amer 34 (*) >90 mL/min   GFR calc Af Amer 40 (*) >90 mL/min   Comment: (NOTE)     The eGFR has been calculated using the CKD EPI equation.     This calculation has not been validated in all clinical situations.     eGFR's persistently <90 mL/min signify possible Chronic Kidney     Disease.  PROTIME-INR     Status: Abnormal   Collection Time    08/13/13  2:00 AM      Result Value Ref Range   Prothrombin Time 25.1 (*) 11.6 - 15.2 seconds   INR 2.37 (*) 0.00 - 1.49  TYPE AND SCREEN     Status: None   Collection Time    08/13/13  8:30 AM      Result Value Ref Range   ABO/RH(D) A POS     Antibody Screen NEG     Sample Expiration 08/16/2013     Unit Number [id number]     Blood Component Type RED CELLS,LR     Unit division 00     Status of Unit ISSUED     Transfusion Status OK TO TRANSFUSE     Crossmatch Result Compatible    PREPARE RBC (CROSSMATCH)     Status: None   Collection Time    08/13/13  8:30 AM      Result Value Ref Range   Order Confirmation ORDER PROCESSED BY BLOOD BANK      Ct Abdomen Pelvis Wo Contrast  08/12/2013   CLINICAL DATA Fall.  Trauma.  Lower back pain.  History of L1 fracture.  EXAM CT CHEST, ABDOMEN  AND PELVIS WITHOUT CONTRAST  TECHNIQUE Multidetector CT imaging of the chest, abdomen and pelvis was performed following the standard protocol without IV contrast.  COMPARISON 03/09/2013 abdominal CT.  FINDINGS CT CHEST FINDINGS  THORACIC INLET/BODY WALL:  No acute abnormality.  MEDIASTINUM:  Cardiomegaly. No pericardial effusion. Previous aortic valvular replacement. Extensive coronary artery atherosclerosis. No evidence of acute aortic injury. No suspicious adenopathy.  LUNG WINDOWS:  There is airspace disease in the right lower lobe. Small to moderate right pleural effusion which is layering water density. Trace left pleural effusion. There is a tiny pneumothorax in the ventral and lower right chest.  OSSEOUS:  See below  CT ABDOMEN AND PELVIS FINDINGS  Liver: No evidence of injury.  Biliary: No evidence of biliary obstruction or stone.  Pancreas: Unremarkable.  Spleen: Minimal fluid around the spleen, which is low-density.  Adrenals: Unremarkable.  Kidneys and ureters: No evidence  of injury. Bilateral atrophy. No hydronephrosis.  Bladder: Unremarkable.  Reproductive: Unremarkable.  Bowel: No wall thickening.  No obstruction  Retroperitoneum: No hematoma.  Peritoneum: Small volume free fluid around the spleen and in the pelvis. The fluid is water density. Pelvic fluid noted on previous examination.  Vascular: Status post aorto bi-iliac stent graft. Coil embolization of the left hypogastric artery. Unchanged or smaller size of the left iliac artery aneurysm sac, 4 cm in diameter. Unchanged size of the aortic aneurysm sac, approximately 4 cm in maximal diameter. 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  OSSEOUS: Acute fractures of the right fourth, fifth, sixth, seventh, eighth, ninth ribs. There is up to 100% displacement inferiorly.  Multiple spinal fractures:  L5: Horizontal low-density cleft through the upper body. Compression is maximal posteriorly, approximately  25%. No subluxation.  L4: Inferior endplate fracture which is new from prior and likely acute.  L3: Superior endplate compression fracture with height loss less than 50%. There is minimal bony retropulsion, without significant canal narrowing.  L1: Remote compression fracture. Exaggerated kyphosis at this level.  T12: Remote appearing Schmorl's node in the superior endplate with surrounding sclerosis.  T3: Horizontal fracture through the upper body with mild compression. This fracture appears acute.  There is no evidence of spinal subluxation or acute posterior element fractures.  Critical Value/emergent results were called by telephone at the time of interpretation on 08/12/2013 at 2:36 PM to Dr. Shirlyn Goltz , who verbally acknowledged these results.  IMPRESSION 1. Acute right fourth through ninth rib fractures. 2. Acute compression type vertebral fractures of L5, L4, L3, T12, and T3. No spinal subluxation or significant bony retropulsion. 3. Trace right pneumothorax. 4. Moderate right pleural effusion which is primarily non hemorrhagic. There is a combination of atelectasis and pneumonia/aspiration in the right lower lobe. 5. Small volume fluid around the spleen without visible parenchymal abnormality; cannot exclude splenic injury without contrast. 6. Small volume pelvic ascites, chronic from October 2014. 7. Non-traumatic findings noted above.  SIGNATURE  Electronically Signed   By: Jorje Guild M.D.   On: 08/12/2013 14:36   Dg Chest 1 View  08/12/2013   CLINICAL DATA Fall  EXAM CHEST - 1 VIEW  COMPARISON 01/29/2013  FINDINGS Cardiac enlargement with vascular congestion and mild fluid overload. Small right pleural effusion has developed since the prior study. Mild bibasilar atelectasis. Underlying COPD.  Fractures of the right sixth and eighth and ninth ribs.  IMPRESSION Right rib fractures. Small right effusion may be due to heart failure or hemothorax.  Cardiac enlargement with mild fluid overload.   SIGNATURE  Electronically Signed   By: Franchot Gallo M.D.   On: 08/12/2013 12:47   Dg Lumbar Spine Complete  08/12/2013   CLINICAL DATA Fall  EXAM LUMBAR SPINE - COMPLETE 4+ VIEW  COMPARISON 03/09/2013  FINDINGS Aortoiliac stent graft in satisfactory position. Coils in the left hypogastric artery.  Chronic compression fractures of T12 and L1 are unchanged. There are new endplate deformities at L3, L4, and L5 consistent with recent fractures. MRI may be helpful for further evaluation of the spinal canal and to date these fractures.  IMPRESSION Mild fractures at L3, L4, and L5 not present on 03/09/2013 and possibly acute. Chronic fractures T12 and L1.  SIGNATURE  Electronically Signed   By: Franchot Gallo M.D.   On: 08/12/2013 12:50   Dg Pelvis 1-2 Views  08/12/2013   CLINICAL DATA Fall  EXAM  PELVIS - 1-2 VIEW  COMPARISON None.  FINDINGS Negative for fracture in the pelvis. Both hips show mild degenerative change. Aortoiliac stent graft. Coils in the left hypogastric artery.  IMPRESSION Negative for fracture.  SIGNATURE  Electronically Signed   By: Franchot Gallo M.D.   On: 08/12/2013 12:47   Ct Head Wo Contrast  08/12/2013   CLINICAL DATA Patient on ground for 4 hr. Back pain. Patient fell this morning at 6 o'clock.  EXAM CT HEAD WITHOUT CONTRAST  CT CERVICAL SPINE WITHOUT CONTRAST  TECHNIQUE Multidetector CT imaging of the head and cervical spine was performed following the standard protocol without intravenous contrast. Multiplanar CT image reconstructions of the cervical spine were also generated.  COMPARISON CT HEAD W/O CM dated 06/11/2013  FINDINGS CT HEAD FINDINGS  There is no evidence of mass effect, midline shift, or extra-axial fluid collections. There is no evidence of a space-occupying lesion or intracranial hemorrhage. There is no evidence of a cortical-based area of acute infarction. There is generalized cerebral atrophy. There is periventricular white matter low attenuation likely secondary to  microangiopathy.  The ventricles and sulci are appropriate for the patient's age. The basal cisterns are patent.  Visualized portions of the orbits are unremarkable. The visualized portions of the paranasal sinuses and mastoid air cells are unremarkable. Cerebrovascular atherosclerotic calcifications are noted.  The osseous structures are unremarkable.  CT CERVICAL SPINE FINDINGS  The alignment is anatomic. The vertebral body heights are maintained. There is no acute fracture. There is no static listhesis. The prevertebral soft tissues are normal. The intraspinal soft tissues are not fully imaged on this examination due to poor soft tissue contrast, but there is no gross soft tissue abnormality.  There is degenerative disc disease at C4-5, C5-6 and C6-7. There broad-based disc osteophyte complexes that C3-4, C4-5, C5-6 and C6-7. There is bilateral uncovertebral degenerative changes C5-6 resulting in foraminal encroachment.  The visualized portions of the lung apices demonstrate no focal abnormality.  IMPRESSION 1. No acute intracranial pathology. 2. No acute osseous injury cervical spine. 3. Cervical spine spondylosis as described above. Evaluation the cervical spine is somewhat limited as the C7 and T1 levels are excluded from the field of view.  SIGNATURE  Electronically Signed   By: Kathreen Devoid   On: 08/12/2013 13:46   Ct Chest Wo Contrast  08/12/2013   CLINICAL DATA Fall.  Trauma.  Lower back pain.  History of L1 fracture.  EXAM CT CHEST, ABDOMEN AND PELVIS WITHOUT CONTRAST  TECHNIQUE Multidetector CT imaging of the chest, abdomen and pelvis was performed following the standard protocol without IV contrast.  COMPARISON 03/09/2013 abdominal CT.  FINDINGS CT CHEST FINDINGS  THORACIC INLET/BODY WALL:  No acute abnormality.  MEDIASTINUM:  Cardiomegaly. No pericardial effusion. Previous aortic valvular replacement. Extensive coronary artery atherosclerosis. No evidence of acute aortic injury. No suspicious  adenopathy.  LUNG WINDOWS:  There is airspace disease in the right lower lobe. Small to moderate right pleural effusion which is layering water density. Trace left pleural effusion. There is a tiny pneumothorax in the ventral and lower right chest.  OSSEOUS:  See below  CT ABDOMEN AND PELVIS FINDINGS  Liver: No evidence of injury.  Biliary: No evidence of biliary obstruction or stone.  Pancreas: Unremarkable.  Spleen: Minimal fluid around the spleen, which is low-density.  Adrenals: Unremarkable.  Kidneys and ureters: No evidence of injury. Bilateral atrophy. No hydronephrosis.  Bladder: Unremarkable.  Reproductive: Unremarkable.  Bowel: No wall thickening.  No obstruction  Retroperitoneum: No hematoma.  Peritoneum: Small volume free fluid around the spleen and in the pelvis. The fluid is water density. Pelvic fluid noted on previous examination.  Vascular: Status post aorto bi-iliac stent graft. Coil embolization of the left hypogastric artery. Unchanged or smaller size of the left iliac artery aneurysm sac, 4 cm in diameter. Unchanged size of the aortic aneurysm sac, approximately 4 cm in maximal diameter. 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  OSSEOUS: Acute fractures of the right fourth, fifth, sixth, seventh, eighth, ninth ribs. There is up to 100% displacement inferiorly.  Multiple spinal fractures:  L5: Horizontal low-density cleft through the upper body. Compression is maximal posteriorly, approximately 25%. No subluxation.  L4: Inferior endplate fracture which is new from prior and likely acute.  L3: Superior endplate compression fracture with height loss less than 50%. There is minimal bony retropulsion, without significant canal narrowing.  L1: Remote compression fracture. Exaggerated kyphosis at this level.  T12: Remote appearing Schmorl's node in the superior endplate with surrounding sclerosis.  T3: Horizontal fracture through the upper body with mild  compression. This fracture appears acute.  There is no evidence of spinal subluxation or acute posterior element fractures.  Critical Value/emergent results were called by telephone at the time of interpretation on 08/12/2013 at 2:36 PM to Dr. Shirlyn Goltz , who verbally acknowledged these results.  IMPRESSION 1. Acute right fourth through ninth rib fractures. 2. Acute compression type vertebral fractures of L5, L4, L3, T12, and T3. No spinal subluxation or significant bony retropulsion. 3. Trace right pneumothorax. 4. Moderate right pleural effusion which is primarily non hemorrhagic. There is a combination of atelectasis and pneumonia/aspiration in the right lower lobe. 5. Small volume fluid around the spleen without visible parenchymal abnormality; cannot exclude splenic injury without contrast. 6. Small volume pelvic ascites, chronic from October 2014. 7. Non-traumatic findings noted above.  SIGNATURE  Electronically Signed   By: Jorje Guild M.D.   On: 08/12/2013 14:36   Ct Cervical Spine Wo Contrast  08/12/2013   CLINICAL DATA Patient on ground for 4 hr. Back pain. Patient fell this morning at 6 o'clock.  EXAM CT HEAD WITHOUT CONTRAST  CT CERVICAL SPINE WITHOUT CONTRAST  TECHNIQUE Multidetector CT imaging of the head and cervical spine was performed following the standard protocol without intravenous contrast. Multiplanar CT image reconstructions of the cervical spine were also generated.  COMPARISON CT HEAD W/O CM dated 06/11/2013  FINDINGS CT HEAD FINDINGS  There is no evidence of mass effect, midline shift, or extra-axial fluid collections. There is no evidence of a space-occupying lesion or intracranial hemorrhage. There is no evidence of a cortical-based area of acute infarction. There is generalized cerebral atrophy. There is periventricular white matter low attenuation likely secondary to microangiopathy.  The ventricles and sulci are appropriate for the patient's age. The basal cisterns are patent.   Visualized portions of the orbits are unremarkable. The visualized portions of the paranasal sinuses and mastoid air cells are unremarkable. Cerebrovascular atherosclerotic calcifications are noted.  The osseous structures are unremarkable.  CT CERVICAL SPINE FINDINGS  The alignment is anatomic. The vertebral body heights are maintained. There is no acute fracture. There is no static listhesis. The prevertebral soft tissues are normal. The intraspinal soft tissues are not fully imaged on this examination due to poor soft tissue contrast, but there is no gross soft tissue abnormality.  There is degenerative disc disease at C4-5, C5-6 and  C6-7. There broad-based disc osteophyte complexes that C3-4, C4-5, C5-6 and C6-7. There is bilateral uncovertebral degenerative changes C5-6 resulting in foraminal encroachment.  The visualized portions of the lung apices demonstrate no focal abnormality.  IMPRESSION 1. No acute intracranial pathology. 2. No acute osseous injury cervical spine. 3. Cervical spine spondylosis as described above. Evaluation the cervical spine is somewhat limited as the C7 and T1 levels are excluded from the field of view.  SIGNATURE  Electronically Signed   By: Kathreen Devoid   On: 08/12/2013 13:46   Dg Chest Port 1 View  08/13/2013   CLINICAL DATA Pneumothorax.  EXAM PORTABLE CHEST - 1 VIEW  COMPARISON DG CHEST 1V PORT dated 08/12/2013; CT CHEST W/O CM dated 08/12/2013  FINDINGS Right chest tube in stable position. Stable tiny right apical pneumothorax noted. Atelectatic changes in the lung bases. Developing infiltrate and/or atelectasis right mid lung field. Prior CABG. Severe cardiomegaly. Mild interstitial prominence noted. These findings suggest congestive heart failure. Multiple right rib fractures are present. These are better demonstrated on recent CT.  IMPRESSION 1. Right chest tube in stable position. Tiny stable right apical pneumothorax. 2. Congestive heart failure with mild interstitial  edema. 3. Bibasilar atelectasis. Developing atelectasis and/or infiltrate in the right mid lung noted. 4. Right rib fractures, better demonstrated by recent CT.  SIGNATURE  Electronically Signed   By: Marcello Moores  Register   On: 08/13/2013 07:56   Dg Chest Port 1 View  08/12/2013   CLINICAL DATA Chest tube placement  EXAM PORTABLE CHEST - 1 VIEW  COMPARISON 08/12/2013  FINDINGS Right chest tube placed in good position with decrease in right pleural effusion. Minimal right apical pneumothorax. Improved aeration in the right lung base  Cardiac enlargement. Improvement in pulmonary vascular congestion. Left lower lobe atelectasis unchanged.  IMPRESSION Right chest tube placement with decrease in right pleural effusion. Minimal right apical pneumothorax  Improvement in congestive heart failure.  Bibasilar atelectasis with improved aeration in the right lung base.  SIGNATURE  Electronically Signed   By: Franchot Gallo M.D.   On: 08/12/2013 15:52    Review of systems not obtained due to patient factors. Blood pressure 143/52, pulse 79, temperature 97.3 F (36.3 C), temperature source Axillary, resp. rate 17, height _0  (1.778 m), weight 72 kg (158 lb 11.7 oz), SpO2 100.00%. patient is awake, alert, conversant and appropriate. He moves his extremities well to complex command. his strength is good, though he is somewhat limited by his chest tube. Sensation is intact.  Assessment/Plan: CT reviewed and shows old fracture of L1 and newer fractures of L3 L4 L5 without significant canal compromise. He is neuro intact. He is quite elderly. He needs to be treated with a corset type brace for 10 to 12 weeks. I will follow him after d/c. No need for surgical intervention.  Faythe Ghee, MD 08/13/2013, 1:03 PM

## 2013-08-13 NOTE — Evaluation (Signed)
Physical Therapy Evaluation Patient Details Name: SALVADOR COUPE MRN: [id number] DOB: 08-03-1922 Today's Date: 08/13/2013 Time: 3716-9678 PT Time Calculation (min): 35 min  PT Assessment / Plan / Recommendation History of Present Illness  80 yo male who fell yesterday and was found down on his floor. He was brought to [facility] ED where he was evaluated and found to have multiple mild compression fractures in the lumbar region.  Clinical Impression  Pt was adm with the above dx. Pt's functional mobility was limited by pain (from chest tube and LE sores) and general weakness. Pt was informed of back precautions but did not receive a handout. It should be noted that the pt receives a handout at next visit. Pt lives alone in a senior community and was independent PTA. Pt to benefit from SNF to address functional limitations and to gain mod I for safe return home alone. Pt to benefit from skilled acute PT to address deficits listed below.     PT Assessment  Patient needs continued PT services    Follow Up Recommendations  SNF    Does the patient have the potential to tolerate intense rehabilitation      Barriers to Discharge Decreased caregiver support pt lives in a senior community but lacks caregiver support throughout the day    Equipment Recommendations  3in1 (PT) (discussed with pt about possibly getting a shower chair)    Recommendations for Other Services OT consult   Frequency Min 3X/week    Precautions / Restrictions Precautions Precautions: Back;Fall Precaution Booklet Issued: No Precaution Comments: Pt currently has a chest tube. PT discussed the back precautions with the pt and RN. Handout should be given to the pt at next visit and before d/c Required Braces or Orthoses: Spinal Brace Spinal Brace: Lumbar corset (awaiting further orders - OOB only ordered ) Restrictions Weight Bearing Restrictions: No   Pertinent Vitals/Pain Pt reports pain with activity at the site of chest  tube and LE blister sores      Mobility  Bed Mobility Overal bed mobility: Needs Assistance Bed Mobility: Rolling;Supine to Sit Rolling: Min assist Supine to sit: Mod assist;+2 for physical assistance General bed mobility comments: pt req verbal cues for log rolling technique and instruction to follow back precautions. mod A x 2 req because pt was experiecing chest pain froim the chest tube and has generalized weakness UE/LE Transfers Overall transfer level: Needs assistance Equipment used: 2 person hand held assist Transfers: Sit to/from Stand Sit to Stand: Mod assist;+2 physical assistance General transfer comment: pt's ability was limited due to weakness and pain in LE (from blister sores) and chest pain. verbal cues to encourage pt to practice proper and safe back precaution meechanics    Exercises General Exercises - Lower Extremity Ankle Circles/Pumps: AAROM;Strengthening;Both;10 reps;Seated;Supine   PT Diagnosis: Difficulty walking;Generalized weakness;Acute pain  PT Problem List: Decreased strength;Decreased range of motion;Decreased activity tolerance;Decreased balance;Decreased mobility;Decreased knowledge of use of DME;Decreased knowledge of precautions;Pain PT Treatment Interventions: DME instruction;Gait training;Stair training;Functional mobility training;Therapeutic activities;Therapeutic exercise;Patient/family education     PT Goals(Current goals can be found in the care plan section) Acute Rehab PT Goals Patient Stated Goal: return home PT Goal Formulation: With patient Time For Goal Achievement: 08/20/13 Potential to Achieve Goals: Good  Visit Information  Last PT Received On: 08/13/13 Assistance Needed: +2 History of Present Illness: 80 yo male who fell yesterday and was found down on his floor. He was brought to [facility] ED where he was evaluated and found to  have multiple mild compression fractures in the lumbar region.       Prior [place] expects to be discharged to:: Private residence Living Arrangements: Alone Available Help at Discharge: Personal care attendant (care not available 24/7 ) Type of Home: Apartment Home Access: Level entry Home Layout: One level Home Equipment: Walker - 2 wheels Additional Comments: pt states that he started using his RW more frequently after he fell 3 weeks ago. Pt states that he feels "more sturdy" using the RW Prior Function Level of Independence: Independent with assistive device(s) Communication Communication: No difficulties    Cognition  Cognition Arousal/Alertness: Awake/alert Behavior During Therapy: WFL for tasks assessed/performed Overall Cognitive Status: Within Functional Limits for tasks assessed    Extremity/Trunk Assessment Upper Extremity Assessment Upper Extremity Assessment: Generalized weakness Lower Extremity Assessment Lower Extremity Assessment: Generalized weakness Cervical / Trunk Assessment Cervical / Trunk Assessment: Kyphotic   Balance Balance Overall balance assessment: Needs assistance Sitting-balance support: Bilateral upper extremity supported Sitting balance-Leahy Scale: Poor Sitting balance - Comments: pt tends to lean posteriorly in seated position Postural control: Posterior lean Standing balance support: Bilateral upper extremity supported Standing balance-Leahy Scale: Poor Standing balance comment: pt required 2 person hand held support in standing position. verbal cues for precaution and sequencing  General Comments General comments (skin integrity, edema, etc.): Bilat LE blister sores wrapped in bandages. Bilat feet had dry  rough skin  End of Session PT - End of Session Equipment Utilized During Treatment: Gait belt;Back brace Activity Tolerance: Patient limited by pain Patient left: in chair;with call bell/phone within reach;with family/visitor present Nurse Communication: Mobility status;Precautions  GP     Manley Mason SPT  Gustavus Bryant PT [phone] 08/13/2013, 5:14 PM

## 2013-08-13 NOTE — Consult Note (Signed)
WOC wound consult note Reason for Consult: Pt is followed by the outpatient wound care center at [facility] for wounds to left and right leg and has compression wraps changed once a week.  He missed his appointment yesterday when he became ill. Left wraps are saturated with large amt strong-smelling green drainage. Removed wraps to assess BLE. Wound type: Left heel with deep tissue injury present on admission, pt did not know it was there he states but it is tender to the touch.  1X1cm dark purple. Left inner upper calf with full thickness wound 1X1X.1cm, 100% red and moist. Left posterior upper calf with full thickness wound .3X.3X.1cm, 100% yellow and moist.  Unable to determine source of drainage and odor which was on the previous compression wraps. Right calf with 2 healing full thickness wounds, each .2X.2X.1cm, yellow moist wound beds, no odor or drainage. Dressing procedure/placement/frequency: Continue present plan of care with silver hydrofiber dressing to left leg wounds to absorb drainage and provide antimicrobial benefits. Ordered ortho tech to apply [employer] and coban to both legs and change twice a week while in the hospital.  Float left heel to reduce pressure to deep tissue injury site. Pt can resume follow-up at the out patient wound care center after discharge. Please re-consult if further assistance is needed.  Thank-you,  Julien Girt MSN, [place], [place], [place], [place]

## 2013-08-13 NOTE — Progress Notes (Signed)
UR completed.  Kolina Kube, RN BSN [facility] CCM Trauma/Neuro ICU Case Manager [phone]  

## 2013-08-13 NOTE — Progress Notes (Signed)
Pt. Seen and examined. Agree with the NP/PA-C note as written. A-fib is rate controlled on diltiazem gtts. Ok to switch to po cardizem. Still receiving blood. Chest tube has bloody output. Patient will need to discuss with Dr. Mare Ferrari about long-term anticoagulation options. Given recent falls, he may not be a warfarin candidate.   Evan Casino, MD, [facility] Attending Cardiologist [place]

## 2013-08-13 NOTE — Progress Notes (Signed)
Patient ID: Evan Perez, male   DOB: December 21, 1922, 80 y.o.   MRN: [id number]    Subjective: Right ribs sore, no central chest pain, no SOB  Objective: Vital signs in last 24 hours: Temp:  [97.8 F (36.6 C)-99 F (37.2 C)] 98.6 F (37 C) (03/12 0700) Pulse Rate:  [73-162] 73 (03/12 0405) Resp:  [12-24] 20 (03/12 0405) BP: (129-168)/(46-92) 138/46 mmHg (03/12 0405) SpO2:  [94 %-100 %] 98 % (03/12 0405) Weight:  [158 lb 11.7 oz (72 kg)] 158 lb 11.7 oz (72 kg) (03/11 1635) Last BM Date:  (prior to admission)  Intake/Output from previous day: 03/11 0701 - 03/12 0700 In: 742 [P.O.:240; I.V.:252; Blood:250] Out: 2825 [Urine:1475; Chest Tube:1350] Intake/Output this shift:    General appearance: alert and cooperative Resp: clear to auscultation bilaterally Chest wall: right sided chest wall tenderness Cardio: irregularly irregular rhythm GI: soft, mild distention, NT, +BS, umbilical hernia spont reduces Extremities: Unna wrap BLE, toes warm Neurologic: Mental status: Alert, oriented, thought content appropriate  Lab Results: CBC   Recent Labs  08/12/13 1155 08/13/13 0200  WBC 8.9 6.8  HGB 8.8* 7.2*  HCT 27.9* 22.0*  PLT 184 148*   BMET  Recent Labs  08/12/13 1155 08/13/13 0200  NA 137 141  K 4.6 3.8  CL 93* 96  CO2 30 33*  GLUCOSE 121* 146*  BUN 91* 93*  CREATININE 1.73* 1.70*  CALCIUM 8.6 8.4   PT/INR  Recent Labs  08/12/13 1155 08/13/13 0200  LABPROT 22.7* 25.1*  INR 2.08* 2.37*   ABG No results found for this basename: PHART, PCO2, PO2, HCO3,  in the last 72 hours  Studies/Results: Ct Abdomen Pelvis Wo Contrast  08/12/2013   CLINICAL DATA Fall.  Trauma.  Lower back pain.  History of L1 fracture.  EXAM CT CHEST, ABDOMEN AND PELVIS WITHOUT CONTRAST  TECHNIQUE Multidetector CT imaging of the chest, abdomen and pelvis was performed following the standard protocol without IV contrast.  COMPARISON 03/09/2013 abdominal CT.  FINDINGS CT CHEST FINDINGS   THORACIC INLET/BODY WALL:  No acute abnormality.  MEDIASTINUM:  Cardiomegaly. No pericardial effusion. Previous aortic valvular replacement. Extensive coronary artery atherosclerosis. No evidence of acute aortic injury. No suspicious adenopathy.  LUNG WINDOWS:  There is airspace disease in the right lower lobe. Small to moderate right pleural effusion which is layering water density. Trace left pleural effusion. There is a tiny pneumothorax in the ventral and lower right chest.  OSSEOUS:  See below  CT ABDOMEN AND PELVIS FINDINGS  Liver: No evidence of injury.  Biliary: No evidence of biliary obstruction or stone.  Pancreas: Unremarkable.  Spleen: Minimal fluid around the spleen, which is low-density.  Adrenals: Unremarkable.  Kidneys and ureters: No evidence of injury. Bilateral atrophy. No hydronephrosis.  Bladder: Unremarkable.  Reproductive: Unremarkable.  Bowel: No wall thickening.  No obstruction  Retroperitoneum: No hematoma.  Peritoneum: Small volume free fluid around the spleen and in the pelvis. The fluid is water density. Pelvic fluid noted on previous examination.  Vascular: Status post aorto bi-iliac stent graft. Coil embolization of the left hypogastric artery. Unchanged or smaller size of the left iliac artery aneurysm sac, 4 cm in diameter. Unchanged size of the aortic aneurysm sac, approximately 4 cm in maximal diameter. 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  OSSEOUS: Acute fractures of the right fourth, fifth, sixth, seventh, eighth, ninth ribs. There is up to 100% displacement inferiorly.  Multiple  spinal fractures:  L5: Horizontal low-density cleft through the upper body. Compression is maximal posteriorly, approximately 25%. No subluxation.  L4: Inferior endplate fracture which is new from prior and likely acute.  L3: Superior endplate compression fracture with height loss less than 50%. There is minimal bony retropulsion, without significant canal  narrowing.  L1: Remote compression fracture. Exaggerated kyphosis at this level.  T12: Remote appearing Schmorl's node in the superior endplate with surrounding sclerosis.  T3: Horizontal fracture through the upper body with mild compression. This fracture appears acute.  There is no evidence of spinal subluxation or acute posterior element fractures.  Critical Value/emergent results were called by telephone at the time of interpretation on 08/12/2013 at 2:36 PM to Dr. Chaney Malling , who verbally acknowledged these results.  IMPRESSION 1. Acute right fourth through ninth rib fractures. 2. Acute compression type vertebral fractures of L5, L4, L3, T12, and T3. No spinal subluxation or significant bony retropulsion. 3. Trace right pneumothorax. 4. Moderate right pleural effusion which is primarily non hemorrhagic. There is a combination of atelectasis and pneumonia/aspiration in the right lower lobe. 5. Small volume fluid around the spleen without visible parenchymal abnormality; cannot exclude splenic injury without contrast. 6. Small volume pelvic ascites, chronic from October 2014. 7. Non-traumatic findings noted above.  SIGNATURE  Electronically Signed   By: Tiburcio Pea M.D.   On: 08/12/2013 14:36   Dg Chest 1 View  08/12/2013   CLINICAL DATA Fall  EXAM CHEST - 1 VIEW  COMPARISON 01/29/2013  FINDINGS Cardiac enlargement with vascular congestion and mild fluid overload. Small right pleural effusion has developed since the prior study. Mild bibasilar atelectasis. Underlying COPD.  Fractures of the right sixth and eighth and ninth ribs.  IMPRESSION Right rib fractures. Small right effusion may be due to heart failure or hemothorax.  Cardiac enlargement with mild fluid overload.  SIGNATURE  Electronically Signed   By: Marlan Palau M.D.   On: 08/12/2013 12:47   Dg Lumbar Spine Complete  08/12/2013   CLINICAL DATA Fall  EXAM LUMBAR SPINE - COMPLETE 4+ VIEW  COMPARISON 03/09/2013  FINDINGS Aortoiliac stent graft  in satisfactory position. Coils in the left hypogastric artery.  Chronic compression fractures of T12 and L1 are unchanged. There are new endplate deformities at L3, L4, and L5 consistent with recent fractures. MRI may be helpful for further evaluation of the spinal canal and to date these fractures.  IMPRESSION Mild fractures at L3, L4, and L5 not present on 03/09/2013 and possibly acute. Chronic fractures T12 and L1.  SIGNATURE  Electronically Signed   By: Marlan Palau M.D.   On: 08/12/2013 12:50   Dg Pelvis 1-2 Views  08/12/2013   CLINICAL DATA Fall  EXAM PELVIS - 1-2 VIEW  COMPARISON None.  FINDINGS Negative for fracture in the pelvis. Both hips show mild degenerative change. Aortoiliac stent graft. Coils in the left hypogastric artery.  IMPRESSION Negative for fracture.  SIGNATURE  Electronically Signed   By: Marlan Palau M.D.   On: 08/12/2013 12:47   Ct Head Wo Contrast  08/12/2013   CLINICAL DATA Patient on ground for 4 hr. Back pain. Patient fell this morning at 6 o'clock.  EXAM CT HEAD WITHOUT CONTRAST  CT CERVICAL SPINE WITHOUT CONTRAST  TECHNIQUE Multidetector CT imaging of the head and cervical spine was performed following the standard protocol without intravenous contrast. Multiplanar CT image reconstructions of the cervical spine were also generated.  COMPARISON CT HEAD W/O CM dated 06/11/2013  FINDINGS CT HEAD FINDINGS  There is no evidence of mass effect, midline shift, or extra-axial fluid collections. There is no evidence of a space-occupying lesion or intracranial hemorrhage. There is no evidence of a cortical-based area of acute infarction. There is generalized cerebral atrophy. There is periventricular white matter low attenuation likely secondary to microangiopathy.  The ventricles and sulci are appropriate for the patient's age. The basal cisterns are patent.  Visualized portions of the orbits are unremarkable. The visualized portions of the paranasal sinuses and mastoid air cells are  unremarkable. Cerebrovascular atherosclerotic calcifications are noted.  The osseous structures are unremarkable.  CT CERVICAL SPINE FINDINGS  The alignment is anatomic. The vertebral body heights are maintained. There is no acute fracture. There is no static listhesis. The prevertebral soft tissues are normal. The intraspinal soft tissues are not fully imaged on this examination due to poor soft tissue contrast, but there is no gross soft tissue abnormality.  There is degenerative disc disease at C4-5, C5-6 and C6-7. There broad-based disc osteophyte complexes that C3-4, C4-5, C5-6 and C6-7. There is bilateral uncovertebral degenerative changes C5-6 resulting in foraminal encroachment.  The visualized portions of the lung apices demonstrate no focal abnormality.  IMPRESSION 1. No acute intracranial pathology. 2. No acute osseous injury cervical spine. 3. Cervical spine spondylosis as described above. Evaluation the cervical spine is somewhat limited as the C7 and T1 levels are excluded from the field of view.  SIGNATURE  Electronically Signed   By: Kathreen Devoid   On: 08/12/2013 13:46   Ct Chest Wo Contrast  08/12/2013   CLINICAL DATA Fall.  Trauma.  Lower back pain.  History of L1 fracture.  EXAM CT CHEST, ABDOMEN AND PELVIS WITHOUT CONTRAST  TECHNIQUE Multidetector CT imaging of the chest, abdomen and pelvis was performed following the standard protocol without IV contrast.  COMPARISON 03/09/2013 abdominal CT.  FINDINGS CT CHEST FINDINGS  THORACIC INLET/BODY WALL:  No acute abnormality.  MEDIASTINUM:  Cardiomegaly. No pericardial effusion. Previous aortic valvular replacement. Extensive coronary artery atherosclerosis. No evidence of acute aortic injury. No suspicious adenopathy.  LUNG WINDOWS:  There is airspace disease in the right lower lobe. Small to moderate right pleural effusion which is layering water density. Trace left pleural effusion. There is a tiny pneumothorax in the ventral and lower right  chest.  OSSEOUS:  See below  CT ABDOMEN AND PELVIS FINDINGS  Liver: No evidence of injury.  Biliary: No evidence of biliary obstruction or stone.  Pancreas: Unremarkable.  Spleen: Minimal fluid around the spleen, which is low-density.  Adrenals: Unremarkable.  Kidneys and ureters: No evidence of injury. Bilateral atrophy. No hydronephrosis.  Bladder: Unremarkable.  Reproductive: Unremarkable.  Bowel: No wall thickening.  No obstruction  Retroperitoneum: No hematoma.  Peritoneum: Small volume free fluid around the spleen and in the pelvis. The fluid is water density. Pelvic fluid noted on previous examination.  Vascular: Status post aorto bi-iliac stent graft. Coil embolization of the left hypogastric artery. Unchanged or smaller size of the left iliac artery aneurysm sac, 4 cm in diameter. Unchanged size of the aortic aneurysm sac, approximately 4 cm in maximal diameter. 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  OSSEOUS: Acute fractures of the right fourth, fifth, sixth, seventh, eighth, ninth ribs. There is up to 100% displacement inferiorly.  Multiple spinal fractures:  L5: Horizontal low-density cleft through the upper body. Compression is maximal posteriorly, approximately 25%. No subluxation.  L4:  Inferior endplate fracture which is new from prior and likely acute.  L3: Superior endplate compression fracture with height loss less than 50%. There is minimal bony retropulsion, without significant canal narrowing.  L1: Remote compression fracture. Exaggerated kyphosis at this level.  T12: Remote appearing Schmorl's node in the superior endplate with surrounding sclerosis.  T3: Horizontal fracture through the upper body with mild compression. This fracture appears acute.  There is no evidence of spinal subluxation or acute posterior element fractures.  Critical Value/emergent results were called by telephone at the time of interpretation on 08/12/2013 at 2:36 PM to Dr.  Shirlyn Goltz , who verbally acknowledged these results.  IMPRESSION 1. Acute right fourth through ninth rib fractures. 2. Acute compression type vertebral fractures of L5, L4, L3, T12, and T3. No spinal subluxation or significant bony retropulsion. 3. Trace right pneumothorax. 4. Moderate right pleural effusion which is primarily non hemorrhagic. There is a combination of atelectasis and pneumonia/aspiration in the right lower lobe. 5. Small volume fluid around the spleen without visible parenchymal abnormality; cannot exclude splenic injury without contrast. 6. Small volume pelvic ascites, chronic from October 2014. 7. Non-traumatic findings noted above.  SIGNATURE  Electronically Signed   By: Jorje Guild M.D.   On: 08/12/2013 14:36   Ct Cervical Spine Wo Contrast  08/12/2013   CLINICAL DATA Patient on ground for 4 hr. Back pain. Patient fell this morning at 6 o'clock.  EXAM CT HEAD WITHOUT CONTRAST  CT CERVICAL SPINE WITHOUT CONTRAST  TECHNIQUE Multidetector CT imaging of the head and cervical spine was performed following the standard protocol without intravenous contrast. Multiplanar CT image reconstructions of the cervical spine were also generated.  COMPARISON CT HEAD W/O CM dated 06/11/2013  FINDINGS CT HEAD FINDINGS  There is no evidence of mass effect, midline shift, or extra-axial fluid collections. There is no evidence of a space-occupying lesion or intracranial hemorrhage. There is no evidence of a cortical-based area of acute infarction. There is generalized cerebral atrophy. There is periventricular white matter low attenuation likely secondary to microangiopathy.  The ventricles and sulci are appropriate for the patient's age. The basal cisterns are patent.  Visualized portions of the orbits are unremarkable. The visualized portions of the paranasal sinuses and mastoid air cells are unremarkable. Cerebrovascular atherosclerotic calcifications are noted.  The osseous structures are unremarkable.  CT  CERVICAL SPINE FINDINGS  The alignment is anatomic. The vertebral body heights are maintained. There is no acute fracture. There is no static listhesis. The prevertebral soft tissues are normal. The intraspinal soft tissues are not fully imaged on this examination due to poor soft tissue contrast, but there is no gross soft tissue abnormality.  There is degenerative disc disease at C4-5, C5-6 and C6-7. There broad-based disc osteophyte complexes that C3-4, C4-5, C5-6 and C6-7. There is bilateral uncovertebral degenerative changes C5-6 resulting in foraminal encroachment.  The visualized portions of the lung apices demonstrate no focal abnormality.  IMPRESSION 1. No acute intracranial pathology. 2. No acute osseous injury cervical spine. 3. Cervical spine spondylosis as described above. Evaluation the cervical spine is somewhat limited as the C7 and T1 levels are excluded from the field of view.  SIGNATURE  Electronically Signed   By: Kathreen Devoid   On: 08/12/2013 13:46   Dg Chest Port 1 View  08/13/2013   CLINICAL DATA Pneumothorax.  EXAM PORTABLE CHEST - 1 VIEW  COMPARISON DG CHEST 1V PORT dated 08/12/2013; CT CHEST W/O CM dated 08/12/2013  FINDINGS Right chest  tube in stable position. Stable tiny right apical pneumothorax noted. Atelectatic changes in the lung bases. Developing infiltrate and/or atelectasis right mid lung field. Prior CABG. Severe cardiomegaly. Mild interstitial prominence noted. These findings suggest congestive heart failure. Multiple right rib fractures are present. These are better demonstrated on recent CT.  IMPRESSION 1. Right chest tube in stable position. Tiny stable right apical pneumothorax. 2. Congestive heart failure with mild interstitial edema. 3. Bibasilar atelectasis. Developing atelectasis and/or infiltrate in the right mid lung noted. 4. Right rib fractures, better demonstrated by recent CT.  SIGNATURE  Electronically Signed   By: Marcello Moores  Register   On: 08/13/2013 07:56   Dg  Chest Port 1 View  08/12/2013   CLINICAL DATA Chest tube placement  EXAM PORTABLE CHEST - 1 VIEW  COMPARISON 08/12/2013  FINDINGS Right chest tube placed in good position with decrease in right pleural effusion. Minimal right apical pneumothorax. Improved aeration in the right lung base  Cardiac enlargement. Improvement in pulmonary vascular congestion. Left lower lobe atelectasis unchanged.  IMPRESSION Right chest tube placement with decrease in right pleural effusion. Minimal right apical pneumothorax  Improvement in congestive heart failure.  Bibasilar atelectasis with improved aeration in the right lung base.  SIGNATURE  Electronically Signed   By: Franchot Gallo M.D.   On: 08/12/2013 15:52    Anti-infectives: Anti-infectives   None      Assessment/Plan: Fall R rib FXs 4-9 with HPTX - continue CT 20cm today, pulmonary toilet AF RVR - now rate controlled, dilt drip to be changed to PO today per cards, appreciate their F/U Mult T and L spine comp FXs - ?age, will ask Dr. Hal Neer from [facility] to consult CHF/HTN - resume home lasix, cards following FEN - reg diet, K ok VTE - INR still up, give 5mg  vitamin K, plexipulse as he wears these at home ABL anemia - transfuse 1u PRBC now, see above Dispo - SDU    LOS: 1 day    Georganna Skeans, MD, MPH, FACS Trauma: [phone] General Surgery: [phone]  08/13/2013

## 2013-08-13 NOTE — Progress Notes (Signed)
Orthopedic Tech Progress Note Patient Details:  NIKAN ELLINGSON 1923/04/22 [id number]  Ortho Devices Type of Ortho Device: Louretta Parma boot Ortho Device/Splint Location: Bilateral unna wraps Ortho Device/Splint Interventions: Application   Cammer, Theodoro Parma 08/13/2013, 12:40 PM

## 2013-08-13 NOTE — Evaluation (Signed)
Physical Therapy Evaluation Patient Details Name: Evan Perez MRN: [id number] DOB: 1922/10/30 Today's Date: 08/13/2013 Time: 9509-3267 PT Time Calculation (min): 35 min  PT Assessment / Plan / Recommendation History of Present Illness  80 yo male who fell yesterday and was found down on his floor. He was brought to [facility] ED where he was evaluated and found to have multiple mild compression fractures in the lumbar region.  Clinical Impression  Pt was adm with the above dx. Pt's function mobility was limited by pain (from chest tube and LE sores) and general weakness. Pt was informed of back precautions but did not receive a handout. It should be noted that the pt receives a handout at next visit. Pt lives alone in a senior community and was independent PTA. Pt to benefit from SNF to address functional limitations and to gain mod I for safe return home alone. Pt to benefit from skilled acute PT to address deficits listed below.     PT Assessment  Patient needs continued PT services    Follow Up Recommendations  SNF    Does the patient have the potential to tolerate intense rehabilitation      Barriers to Discharge Decreased caregiver support pt lives in a senior community but lacks caregiver support throughout the day    Equipment Recommendations  3in1 (PT) (discussed with pt about possibly getting a shower chair)    Recommendations for Other Services OT consult   Frequency Min 3X/week    Precautions / Restrictions Precautions Precautions: Back;Fall Precaution Booklet Issued: No Precaution Comments: Pt currently has a chest tube. PT discussed the back precautions with the pt and RN. Handout should be given to the pt at next visit and before d/c Required Braces or Orthoses: Spinal Brace Spinal Brace: Lumbar corset Restrictions Weight Bearing Restrictions: No   Pertinent Vitals/Pain Pt reports pain with activity at the site of chest tube and LE blister sores      Mobility   Bed Mobility Overal bed mobility: Needs Assistance Bed Mobility: Rolling;Supine to Sit Rolling: Min assist Supine to sit: Mod assist;+2 for physical assistance General bed mobility comments: pt req verbal cues for rolling and instruction to follow back precautions. mod A x 2 req because pt was experiecing chest pain froim the chest tube and has generalized weakness UE/LE Transfers Overall transfer level: Needs assistance Equipment used: 2 person hand held assist Transfers: Sit to/from Stand Sit to Stand: Mod assist;+2 physical assistance General transfer comment: pt's ability was limited due to weakness and pain in LE (from blister sores) and chest pain. verbal cues to encourage pt to practice proper and safe back precaution meechanics    Exercises General Exercises - Lower Extremity Ankle Circles/Pumps: AAROM;Strengthening;Both;10 reps;Seated;Supine   PT Diagnosis: Difficulty walking;Generalized weakness;Acute pain  PT Problem List: Decreased strength;Decreased range of motion;Decreased activity tolerance;Decreased balance;Decreased mobility;Decreased knowledge of use of DME;Decreased knowledge of precautions;Pain PT Treatment Interventions: DME instruction;Gait training;Stair training;Functional mobility training;Therapeutic activities;Therapeutic exercise;Patient/family education     PT Goals(Current goals can be found in the care plan section) Acute Rehab PT Goals Patient Stated Goal: return home PT Goal Formulation: With patient Time For Goal Achievement: 08/20/13 Potential to Achieve Goals: Good  Visit Information  Last PT Received On: 08/13/13 Assistance Needed: +2 History of Present Illness: 80 yo male who fell yesterday and was found down on his floor. He was brought to [facility] ED where he was evaluated and found to have multiple mild compression fractures in the lumbar region.  Prior [place] expects to be discharged to:: Private  residence Living Arrangements: Alone Available Help at Discharge: Personal care attendant (care not available 24/7 ) Type of Home: Apartment Home Access: Level entry Home Layout: One level Home Equipment: Walker - 2 wheels Additional Comments: pt states that he started using his RW more frequently after he fell 3 weeks ago. Pt states that he feels "more sturdy" using the RW Prior Function Level of Independence: Independent with assistive device(s) Communication Communication: No difficulties    Cognition  Cognition Arousal/Alertness: Awake/alert Behavior During Therapy: WFL for tasks assessed/performed Overall Cognitive Status: Within Functional Limits for tasks assessed    Extremity/Trunk Assessment Upper Extremity Assessment Upper Extremity Assessment: Generalized weakness Lower Extremity Assessment Lower Extremity Assessment: Generalized weakness Cervical / Trunk Assessment Cervical / Trunk Assessment: Kyphotic   Balance Balance Overall balance assessment: Needs assistance Sitting-balance support: Bilateral upper extremity supported Sitting balance-Leahy Scale: Poor Sitting balance - Comments: pt tends to lean posteriorly in seated position Postural control: Posterior lean Standing balance support: Bilateral upper extremity supported Standing balance-Leahy Scale: Poor Standing balance comment: pt requ 2 person hand held support instanding position. verbal cues for precaution and sequencing  General Comments General comments (skin integrity, edema, etc.): Bilat LE blister sores wrapped in bandages. Bilat feet had dry  rough skin  End of Session PT - End of Session Equipment Utilized During Treatment: Gait belt;Back brace Activity Tolerance: Patient limited by pain Patient left: in chair;with call bell/phone within reach;with family/visitor present Nurse Communication: Mobility status;Precautions  GP     Evan Perez SPT 08/13/2013, 5:04 PM

## 2013-08-13 NOTE — Progress Notes (Signed)
    Subjective:  Supine in bed, no distress  Objective:  Vital Signs in the last 24 hours: Temp:  [97.3 F (36.3 C)-99 F (37.2 C)] 97.3 F (36.3 C) (03/12 1130) Pulse Rate:  [73-118] 79 (03/12 1130) Resp:  [13-24] 17 (03/12 1130) BP: (129-163)/(46-92) 143/52 mmHg (03/12 1130) SpO2:  [94 %-100 %] 100 % (03/12 1130) Weight:  [158 lb 11.7 oz (72 kg)] 158 lb 11.7 oz (72 kg) (03/11 1635)  Intake/Output from previous day:  Intake/Output Summary (Last 24 hours) at 08/13/13 1419 Last data filed at 08/13/13 1208  Gross per 24 hour  Intake 1664.5 ml  Output   2905 ml  Net -1240.5 ml    Physical Exam: General appearance: alert, cooperative, no distress and looks younger than stated age Lungs: chest tube on Rt Heart: irregularly irregular rhythm and 2/6 systolic murmur LSB AOv   Rate: 78  Rhythm: atrial fibrillation  Lab Results:  Recent Labs  08/12/13 1155 08/13/13 0200  WBC 8.9 6.8  HGB 8.8* 7.2*  PLT 184 148*    Recent Labs  08/12/13 1155 08/13/13 0200  NA 137 141  K 4.6 3.8  CL 93* 96  CO2 30 33*  GLUCOSE 121* 146*  BUN 91* 93*  CREATININE 1.73* 1.70*   No results found for this basename: TROPONINI, CK, MB,  in the last 72 hours  Recent Labs  08/13/13 0200  INR 2.37*    Imaging: Imaging results have been reviewed  Cardiac Studies:  Assessment/Plan:   Principal Problem:   Fall from standing Active Problems:   Hemothorax on right   Multiple fractures of ribs of right side   Severe AS s/p bovine tissue AVR 2011   Chronic atrial fibrillation- rapid AF on admission 08/12/13   Chronic anticoagulation   Chronic diastolic CHF (congestive heart failure)   CKD (chronic kidney disease), stage III   Anemia   Elevated troponin- suspected Type 2 MI   AAA (abdominal aortic aneurysm) s/p endovascular repair 01/2013   Hypertension   Hypertensive heart disease   CAD (coronary artery disease) - nonobstructive by cath 2011    PLAN: Will review meds with  MD, he has been on high dose Lasix as an OP for diastolic CHF. His AF rate is controlled on IV Diltiazem 10 mg/Hr. Not sure he is still a candidate for Coumadin after discharge, he says he has had falls at home.   Kerin Ransom PA-C Beeper [phone] 08/13/2013, 2:19 PM

## 2013-08-14 ENCOUNTER — Inpatient Hospital Stay (HOSPITAL_COMMUNITY): Payer: Medicare Other

## 2013-08-14 ENCOUNTER — Ambulatory Visit: Payer: Medicare Other | Admitting: Cardiology

## 2013-08-14 DIAGNOSIS — J90 Pleural effusion, not elsewhere classified: Secondary | ICD-10-CM

## 2013-08-14 DIAGNOSIS — S32009A Unspecified fracture of unspecified lumbar vertebra, initial encounter for closed fracture: Secondary | ICD-10-CM

## 2013-08-14 DIAGNOSIS — I2510 Atherosclerotic heart disease of native coronary artery without angina pectoris: Secondary | ICD-10-CM

## 2013-08-14 DIAGNOSIS — D62 Acute posthemorrhagic anemia: Secondary | ICD-10-CM

## 2013-08-14 LAB — CBC
HEMATOCRIT: 20.1 % — AB (ref 39.0–52.0)
Hemoglobin: 6.6 g/dL — CL (ref 13.0–17.0)
MCH: 29.3 pg (ref 26.0–34.0)
MCHC: 32.8 g/dL (ref 30.0–36.0)
MCV: 89.3 fL (ref 78.0–100.0)
PLATELETS: 129 10*3/uL — AB (ref 150–400)
RBC: 2.25 MIL/uL — AB (ref 4.22–5.81)
RDW: 19.1 % — ABNORMAL HIGH (ref 11.5–15.5)
WBC: 8.8 10*3/uL (ref 4.0–10.5)

## 2013-08-14 LAB — LACTATE DEHYDROGENASE, PLEURAL OR PERITONEAL FLUID
LD FL: 387 U/L — AB (ref 3–23)
LD FL: 464 U/L — AB (ref 3–23)

## 2013-08-14 LAB — BASIC METABOLIC PANEL
BUN: 99 mg/dL — AB (ref 6–23)
CALCIUM: 7.9 mg/dL — AB (ref 8.4–10.5)
CO2: 28 meq/L (ref 19–32)
CREATININE: 1.85 mg/dL — AB (ref 0.50–1.35)
Chloride: 96 mEq/L (ref 96–112)
GFR calc Af Amer: 36 mL/min — ABNORMAL LOW (ref >90)
GFR calc non Af Amer: 31 mL/min — ABNORMAL LOW (ref >90)
GLUCOSE: 105 mg/dL — AB (ref 70–99)
Potassium: 4 mEq/L (ref 3.7–5.3)
Sodium: 137 mEq/L (ref 137–147)

## 2013-08-14 LAB — HEMATOCRIT, BODY FLUID: Hematocrit, Fluid: 1 %

## 2013-08-14 LAB — GLUCOSE, SEROUS FLUID: GLUCOSE FL: 128 mg/dL

## 2013-08-14 LAB — BODY FLUID CELL COUNT WITH DIFFERENTIAL
Eos, Fluid: NONE SEEN %
Eos, Fluid: 1 %
Lymphs, Fluid: 5 %
Lymphs, Fluid: 10 %
Monocyte-Macrophage-Serous Fluid: 9 % — ABNORMAL LOW (ref 50–90)
Monocyte-Macrophage-Serous Fluid: 7 % — ABNORMAL LOW (ref 50–90)
NEUTROPHIL FLUID: 86 % — AB (ref 0–25)
Neutrophil Count, Fluid: 82 % — ABNORMAL HIGH (ref 0–25)
Total Nucleated Cell Count, Fluid: 1571 cu mm — ABNORMAL HIGH (ref 0–1000)
Total Nucleated Cell Count, Fluid: 814 cu mm (ref 0–1000)

## 2013-08-14 LAB — PREPARE RBC (CROSSMATCH)

## 2013-08-14 LAB — PROTIME-INR
INR: 1.85 — ABNORMAL HIGH (ref 0.00–1.49)
PROTHROMBIN TIME: 20.8 s — AB (ref 11.6–15.2)

## 2013-08-14 LAB — PROTEIN, BODY FLUID: Total protein, fluid: 1.8 g/dL

## 2013-08-14 MED ORDER — VITAMIN K1 10 MG/ML IJ SOLN
5.0000 mg | Freq: Once | INTRAVENOUS | Status: AC
  Administered 2013-08-14: 5 mg via INTRAVENOUS
  Filled 2013-08-14: qty 0.5

## 2013-08-14 MED ORDER — DILTIAZEM HCL 30 MG PO TABS
30.0000 mg | ORAL_TABLET | Freq: Four times a day (QID) | ORAL | Status: DC
  Administered 2013-08-14 – 2013-08-17 (×12): 30 mg via ORAL
  Filled 2013-08-14 (×16): qty 1

## 2013-08-14 MED ORDER — PNEUMOCOCCAL VAC POLYVALENT 25 MCG/0.5ML IJ INJ: 0.5000 mL | INJECTION | INTRAMUSCULAR | Status: DC | PRN

## 2013-08-14 NOTE — Consult Note (Signed)
Name: Evan Perez MRN: [id number] DOB: 07-14-1922    ADMISSION DATE:  08/12/2013 CONSULTATION DATE:  08/14/13  REFERRING MD :  Trauma PRIMARY SERVICE:  Trauma  CHIEF COMPLAINT:  Pleural Effusion  BRIEF PATIENT DESCRIPTION: 79 y.o. M presented to [facility] ED on 3/11 after sustained mechanical fall while using restroom.  Workup revealed multiple rib fx's, multiple acute vertebral compression fx's, R PTX, R hemothorax. Chest tube placed by Trauma.  PCCM called 3/13 to evaluate R effusion and small L effusion.  SIGNIFICANT EVENTS / STUDIES:  3/11 - fell at home, presented to ED, workup revealed multiple rib fx's, PTX, pleural effusions. 3/13 - Hgb drop to 6.6, transfused 1 u PRBC.  LINES / TUBES: R Chest Tube 3/11 >>>  CULTURES: Pleural fluid 3/13 >>>  ANTIBIOTICS: None  HISTORY OF PRESENT ILLNESS:  Evan Perez is an 80 y/o M with multiple medical problems presents to [facility] ED after suffering a fall on 3/11 while using the restroom.  He got up to go to the bathroom early morning of 3/11 and wile turning around to go back to the other room, he says he lost his balance and fell to the ground. He denied any syncope at the time and states it was simply a mechanical fall.   He was unable to get up at the time and waited on the ground until a home health aide found him roughly 4 hours later. Due to severe back pain and right side pain he was brought to the ER.   Of note, this was fall #4 since January, all mechanical sounding.  CT in ED showed multiple rib fractures as well as multiple acute vertebral compression fractures, trace R pneumothorax, and moderate right pleural effusion. He was given 2 units FFP with CT insertion for hemothorax. CT head was nonacute. He was initially in RVR with rates 140s-160s. With initiation of cardizem, HR improved to the 90s. Labwork revealed anemia with Hgb 8.8 (previously 10.6), BUN of 91, Cr of 1.73 (baseline 2-2.2), and POC troponin of 0.21. He was hypertensive on  arrival, but had normal oxygen. On 3/13, PCCM was consulted for eval of pleural effusion.   PAST MEDICAL HISTORY :  Past Medical History  Diagnosis Date  . History of aortic stenosis     a. Severe s/p bovine pericardial tissue AVR 04/2010.  Marland Kitchen Hypertension   . Fall 2011    FRACTURE VERTEBRAL  . Chronic diastolic CHF (congestive heart failure)     a. EF 50-55% by echo 05/2013.  Marland Kitchen Permanent atrial fibrillation     On chronic Coumadin anticoagulation  . Cancer   . CAD (coronary artery disease)     a. Nonobstructive by 2011 cath.  . Hyperlipidemia   . Complication of anesthesia     "drowsy for 2 days after OR" (01/26/2013)  . AAA (abdominal aortic aneurysm)     a. 01/2013: endovascular repair of abdominal aortic and left iliac aneurysm.  . Iliac artery aneurysm, left     a. 01/2013: endovascular repair of abdominal aortic and left iliac aneurysm.  . CKD (chronic kidney disease), stage III     a. Baseline Cr 2-2.2.  . Tricuspid regurgitation     a. Mod-severe TR by echo 05/2013.   Past Surgical History  Procedure Laterality Date  . Aortic valve replacement      NOV 2011-TISSUE VALVE, 23 mm Mitroflow aortic pericardial heart valve  . Cardiac catheterization      03/2010-non obstructive CAD; severe AS  .  Vasectomy    . Tonsillectomy    . Cardiac valve replacement    . Embolization      coil embolization of the left hypogastric artery prior to endovascular repair/notes 01/26/2013  . Cataract extraction w/ intraocular lens  implant, bilateral Bilateral     "right ~ 1999; left ~ 2012" (01/26/2013)  . Abdominal aortic endovascular stent graft N/A 01/29/2013    Procedure: ABDOMINAL AORTIC ENDOVASCULAR STENT GRAFT - GORE; ULTRASOUND GUIDED;  Surgeon: Serafina Mitchell, MD;  Location: [facility] OR;  Service: Vascular;  Laterality: N/A;   Prior to Admission medications   Medication Sig Start Date End Date Taking? Authorizing Provider  aspirin 81 MG tablet Take 81 mg by mouth every Monday,  Wednesday, and Friday.    Yes Historical Provider, MD  azelastine (ASTELIN) 137 MCG/SPRAY nasal spray Place 1 spray into the nose 2 (two) times daily as needed. Use in each nostril as directed   Yes Historical Provider, MD  betamethasone valerate (VALISONE) 0.1 % cream Apply 1 application topically 3 (three) times a week.    Yes Historical Provider, MD  cloNIDine (CATAPRES) 0.1 MG tablet Take 0.2 mg by mouth 2 (two) times daily.   Yes Historical Provider, MD  furosemide (LASIX) 40 MG tablet Take 120 mg by mouth 2 (two) times daily.   Yes Historical Provider, MD  guaiFENesin (MUCINEX) 600 MG 12 hr tablet Take 600 mg by mouth 2 (two) times daily as needed for congestion. For congestion.   Yes Historical Provider, MD  hydrALAZINE (APRESOLINE) 25 MG tablet Take 25 mg by mouth daily.    Yes Historical Provider, MD  levothyroxine (SYNTHROID, LEVOTHROID) 25 MCG tablet Take 25 mcg by mouth daily before breakfast.   Yes Historical Provider, MD  lisinopril (PRINIVIL,ZESTRIL) 10 MG tablet Take 10 mg by mouth daily.   Yes Historical Provider, MD  Multiple Vitamins-Iron (MULTIVITAMIN/IRON PO) Take 1 tablet by mouth daily.    Yes Historical Provider, MD  OVER THE COUNTER MEDICATION Place 1 spray into both nostrils daily as needed (for allergies). neilmed nasal wash   Yes Historical Provider, MD  warfarin (JANTOVEN) 5 MG tablet Take 5 mg by mouth every evening. Take a whole tablet of 5mg  everyday except Thursday take a half of a tablet 2.5mg    Yes Historical Provider, MD   Allergies  Allergen Reactions  . Dust Mite Extract   . Keflex [Cephalexin]   . Lipitor [Atorvastatin Calcium] Other (See Comments)    Nervousness     . Losartan     Blurred vision, trouble sleeping  . Metolazone Nausea Only  . Ramipril Other (See Comments)    Pt cannot remember reaction   . Zocor [Simvastatin] Other (See Comments)    Nervousness   . Lovenox [Enoxaparin Sodium] Rash    Rash on abdomen, back, lower legs.     FAMILY  HISTORY:  Family History  Problem Relation Age of Onset  . Hypertension Mother   . Heart disease Mother   . Heart attack Mother   . Hypertension Father   . Heart disease Father   . Heart disease Brother    SOCIAL HISTORY:  reports that he has quit smoking. His smoking use included Cigarettes. He has a 30 pack-year smoking history. He has never used smokeless tobacco. He reports that he drinks about 1.2 ounces of alcohol per week. He reports that he does not use illicit drugs.  REVIEW OF SYSTEMS:  Negative except as stated in HPI.  SUBJECTIVE: Breathing slowly  improving, pain with movement but controlled at rest.  Mild non-productive cough. CT on water seal with no leak, roughly 250 cc drainage since 7AM.  VITAL SIGNS: Temp:  [97.6 F (36.4 C)-98.5 F (36.9 C)] 98 F (36.7 C) (03/13 1230) Pulse Rate:  [62-85] 67 (03/13 1150) Resp:  [13-24] 14 (03/13 1150) BP: (96-145)/(47-68) 122/55 mmHg (03/13 1150) SpO2:  [90 %-100 %] 98 % (03/13 1150) Weight:  [161 lb 13.1 oz (73.4 kg)] 161 lb 13.1 oz (73.4 kg) (03/13 0500)  PHYSICAL EXAMINATION: General: Pleasant elderly male, resting in bed, in NAD. Neuro: A&O x 3, non-focal.  HEENT: [place]/AT. PERRL, sclerae anicteric. Cardiovascular: IRIR, SEM noted at L and R USB.. Lungs: Respirations shallow but unlabored.  CTA bilaterally, No W/R/R.  Abdomen: BS x 4, soft, NT/ND.  Rib/back brace in place. Musculoskeletal: No gross deformities, no edema.  Skin: Intact, warm.  Dressings on bilateral LE's.     Recent Labs Lab 08/12/13 1155 08/13/13 0200 08/14/13 0334  NA 137 141 137  K 4.6 3.8 4.0  CL 93* 96 96  CO2 30 33* 28  BUN 91* 93* 99*  CREATININE 1.73* 1.70* 1.85*  GLUCOSE 121* 146* 105*    Recent Labs Lab 08/12/13 1155 08/13/13 0200 08/14/13 0334  HGB 8.8* 7.2* 6.6*  HCT 27.9* 22.0* 20.1*  WBC 8.9 6.8 8.8  PLT 184 148* 129*   Dg Chest Port 1 View  08/14/2013   CLINICAL DATA:  Right rib fractures, pneumothorax  EXAM:  PORTABLE CHEST - 1 VIEW  COMPARISON:  Portable exam 0554 hr compared to 08/13/2013  FINDINGS: Right thoracostomy tube stable.  Enlargement of cardiac silhouette post MVR.  Atherosclerotic calcification aorta.  Mediastinal contours and pulmonary vascularity otherwise normal.  Tiny residual right apex pneumothorax, decreased.  Subsegmental atelectasis right base.  Atelectasis versus consolidation left lower lobe.  Probable underlying COPD with improved pulmonary edema.  No pneumothorax.  Bones demineralized.  IMPRESSION: Tiny residual right pneumothorax post thoracostomy tube.  Linear subsegmental atelectasis right lung base with atelectasis versus consolidation in left lower lobe.  Improved pulmonary edema.   Electronically Signed   By: Ulyses Southward M.D.   On: 08/14/2013 08:09   Dg Chest Port 1 View  08/13/2013   CLINICAL DATA Pneumothorax.  EXAM PORTABLE CHEST - 1 VIEW  COMPARISON DG CHEST 1V PORT dated 08/12/2013; CT CHEST W/O CM dated 08/12/2013  FINDINGS Right chest tube in stable position. Stable tiny right apical pneumothorax noted. Atelectatic changes in the lung bases. Developing infiltrate and/or atelectasis right mid lung field. Prior CABG. Severe cardiomegaly. Mild interstitial prominence noted. These findings suggest congestive heart failure. Multiple right rib fractures are present. These are better demonstrated on recent CT.  IMPRESSION 1. Right chest tube in stable position. Tiny stable right apical pneumothorax. 2. Congestive heart failure with mild interstitial edema. 3. Bibasilar atelectasis. Developing atelectasis and/or infiltrate in the right mid lung noted. 4. Right rib fractures, better demonstrated by recent CT.  SIGNATURE  Electronically Signed   By: Maisie Fus  Register   On: 08/13/2013 07:56    ASSESSMENT / PLAN:  Right PTX - s/p chest tube, improving, small residual remains. Pleural Effusion, R > L - s/p chest tube, - probably post traumatic exudative effusion that should heal with  time. Multiple rib fx's - s/p fall. Plan: - Send fluid and serum for Protein and LDH. - Send pleural fluid for cell count with diff, culture, glucose, cholesterol, triglycerides, hematocrit. - Pain control. - Pulmonary Toilet. - CXR  in AM.   Montey Hora, PA - [facility] [place] Pgr: [phone]  or [phone]  Will reannalyze pleural fluid.  Will likely be exudative post traumatic chest.  Does not appear to be a hemothorax at this point.  Even if exudative will likely be related to trauma.  Will send cultures as well but clinical picture is not consistent with an empyema.  Patient seen and examined, agree with above note.  I dictated the care and orders written for this patient under my direction.  Rush Farmer, MD [phone]

## 2013-08-14 NOTE — Progress Notes (Addendum)
Pt. Seen and examined. Agree with the NP/PA-C note as written.  Agree with holding lasix due to rising BUN. Appears to have ongoing blood loss anemia. Defer to surgery. Ok to switch to short acting diltiazem. Not a warfarin candidate in the future.  Pixie Casino, MD, [facility] Attending Cardiologist [place]

## 2013-08-14 NOTE — Progress Notes (Addendum)
Paged on callTrauma MD regarding critical Hgb of 6.6. No new orders received will continue to monitor closely. Pt resting VSS.

## 2013-08-14 NOTE — Progress Notes (Signed)
Patient: Evan Perez / Admit Date: 08/12/2013 / Date of Encounter: 08/14/2013, 10:24 AM  Subjective  No CP or SOB. Feels somewhat dizzy this AM. "I am sick and tired of laying in the same position." Denies any recent GI bleeding symptoms. Hgb down to 6.6 -> receiving blood now. Chest tube continues to drain bloody fluid.  Objective   Telemetry: atrial fib CVR 70s-80s, nocturnal brady into the mid 40s  Physical Exam: Blood pressure 133/64, pulse 85, temperature 97.6 F (36.4 C), temperature source Oral, resp. rate 16, height 5\' 10"  (1.778 m), weight 161 lb 13.1 oz (73.4 kg), SpO2 100.00%. General: Well developed thin WM in no acute distress.  Head: Normocephalic, atraumatic, sclera non-icteric, no xanthomas, nares are without discharge.  Neck: JVD not elevated.  Lungs: Clear bilaterally to auscultation without wheezes, rales, or rhonchi. Breathing is unlabored.  Heart: Irregular, borderline tachy with S1 S2. Crisp valve sound. 2/6 SEM LSB. No significant rubs or gallops appreciated.  Abdomen: Soft, non-tender, non-distended with normoactive bowel sounds. No hepatomegaly. No rebound/guarding. No obvious abdominal masses.  Msk: Generalized muscular atrophy noted.  Extremities: No clubbing or cyanosis. Bilateral LE have circumferential compressive dressings with what appears to be erythema beneath but no significant edema. Neuro: Alert and oriented X 3. No facial asymmetry. No focal deficit. Moves all extremities spontaneously.  Psych: Responds to questions appropriately with a normal affect.     Intake/Output Summary (Last 24 hours) at 08/14/13 1024 Last data filed at 08/14/13 0900  Gross per 24 hour  Intake 2517.5 ml  Output   2250 ml  Net  267.5 ml    Inpatient Medications:  . Chlorhexidine Gluconate Cloth  6 each Topical Q0600  . cloNIDine  0.2 mg Oral BID  . docusate sodium  100 mg Oral BID  . furosemide  120 mg Oral BID  . levothyroxine  25 mcg Oral QAC breakfast  .  multivitamin with minerals  1 tablet Oral Daily  . mupirocin ointment  1 application Nasal BID  . polyethylene glycol  17 g Oral Daily   Infusions:  . diltiazem (CARDIZEM) infusion 5 mg/hr (08/14/13 0900)    Labs:  Recent Labs  08/13/13 0200 08/14/13 0334  NA 141 137  K 3.8 4.0  CL 96 96  CO2 33* 28  GLUCOSE 146* 105*  BUN 93* 99*  CREATININE 1.70* 1.85*  CALCIUM 8.4 7.9*    Recent Labs  08/12/13 1155 08/13/13 0200  AST 56* 36  ALT 25 21  ALKPHOS 106 90  BILITOT 0.6 0.5  PROT 7.9 6.7  ALBUMIN 2.6* 2.3*    Recent Labs  08/12/13 1155 08/13/13 0200 08/14/13 0334  WBC 8.9 6.8 8.8  NEUTROABS 7.6  --   --   HGB 8.8* 7.2* 6.6*  HCT 27.9* 22.0* 20.1*  MCV 88.6 88.7 89.3  PLT 184 148* 129*    Recent Labs  08/12/13 1438  CKTOTAL 183   No components found with this basename: POCBNP,  No results found for this basename: HGBA1C,  in the last 72 hours   Radiology/Studies:  Ct Abdomen Pelvis Wo Contrast  08/12/2013   CLINICAL DATA Fall.  Trauma.  Lower back pain.  History of L1 fracture.  EXAM CT CHEST, ABDOMEN AND PELVIS WITHOUT CONTRAST  TECHNIQUE Multidetector CT imaging of the chest, abdomen and pelvis was performed following the standard protocol without IV contrast.  COMPARISON 03/09/2013 abdominal CT.  FINDINGS CT CHEST FINDINGS  THORACIC INLET/BODY WALL:  No acute abnormality.  MEDIASTINUM:  Cardiomegaly. No pericardial effusion. Previous aortic valvular replacement. Extensive coronary artery atherosclerosis. No evidence of acute aortic injury. No suspicious adenopathy.  LUNG WINDOWS:  There is airspace disease in the right lower lobe. Small to moderate right pleural effusion which is layering water density. Trace left pleural effusion. There is a tiny pneumothorax in the ventral and lower right chest.  OSSEOUS:  See below  CT ABDOMEN AND PELVIS FINDINGS  Liver: No evidence of injury.  Biliary: No evidence of biliary obstruction or stone.  Pancreas: Unremarkable.   Spleen: Minimal fluid around the spleen, which is low-density.  Adrenals: Unremarkable.  Kidneys and ureters: No evidence of injury. Bilateral atrophy. No hydronephrosis.  Bladder: Unremarkable.  Reproductive: Unremarkable.  Bowel: No wall thickening.  No obstruction  Retroperitoneum: No hematoma.  Peritoneum: Small volume free fluid around the spleen and in the pelvis. The fluid is water density. Pelvic fluid noted on previous examination.  Vascular: Status post aorto bi-iliac stent graft. Coil embolization of the left hypogastric artery. Unchanged or smaller size of the left iliac artery aneurysm sac, 4 cm in diameter. Unchanged size of the aortic aneurysm sac, approximately 4 cm in maximal diameter. 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  OSSEOUS: Acute fractures of the right fourth, fifth, sixth, seventh, eighth, ninth ribs. There is up to 100% displacement inferiorly.  Multiple spinal fractures:  L5: Horizontal low-density cleft through the upper body. Compression is maximal posteriorly, approximately 25%. No subluxation.  L4: Inferior endplate fracture which is new from prior and likely acute.  L3: Superior endplate compression fracture with height loss less than 50%. There is minimal bony retropulsion, without significant canal narrowing.  L1: Remote compression fracture. Exaggerated kyphosis at this level.  T12: Remote appearing Schmorl's node in the superior endplate with surrounding sclerosis.  T3: Horizontal fracture through the upper body with mild compression. This fracture appears acute.  There is no evidence of spinal subluxation or acute posterior element fractures.  Critical Value/emergent results were called by telephone at the time of interpretation on 08/12/2013 at 2:36 PM to Dr. Shirlyn Goltz , who verbally acknowledged these results.  IMPRESSION 1. Acute right fourth through ninth rib fractures. 2. Acute compression type vertebral fractures of L5, L4,  L3, T12, and T3. No spinal subluxation or significant bony retropulsion. 3. Trace right pneumothorax. 4. Moderate right pleural effusion which is primarily non hemorrhagic. There is a combination of atelectasis and pneumonia/aspiration in the right lower lobe. 5. Small volume fluid around the spleen without visible parenchymal abnormality; cannot exclude splenic injury without contrast. 6. Small volume pelvic ascites, chronic from October 2014. 7. Non-traumatic findings noted above.  SIGNATURE  Electronically Signed   By: Jorje Guild M.D.   On: 08/12/2013 14:36   Dg Chest 1 View  08/12/2013   CLINICAL DATA Fall  EXAM CHEST - 1 VIEW  COMPARISON 01/29/2013  FINDINGS Cardiac enlargement with vascular congestion and mild fluid overload. Small right pleural effusion has developed since the prior study. Mild bibasilar atelectasis. Underlying COPD.  Fractures of the right sixth and eighth and ninth ribs.  IMPRESSION Right rib fractures. Small right effusion may be due to heart failure or hemothorax.  Cardiac enlargement with mild fluid overload.  SIGNATURE  Electronically Signed   By: Franchot Gallo M.D.   On: 08/12/2013 12:47   Dg Lumbar Spine Complete  08/12/2013   CLINICAL DATA Fall  EXAM LUMBAR SPINE - COMPLETE 4+  VIEW  COMPARISON 03/09/2013  FINDINGS Aortoiliac stent graft in satisfactory position. Coils in the left hypogastric artery.  Chronic compression fractures of T12 and L1 are unchanged. There are new endplate deformities at L3, L4, and L5 consistent with recent fractures. MRI may be helpful for further evaluation of the spinal canal and to date these fractures.  IMPRESSION Mild fractures at L3, L4, and L5 not present on 03/09/2013 and possibly acute. Chronic fractures T12 and L1.  SIGNATURE  Electronically Signed   By: Franchot Gallo M.D.   On: 08/12/2013 12:50   Dg Pelvis 1-2 Views  08/12/2013   CLINICAL DATA Fall  EXAM PELVIS - 1-2 VIEW  COMPARISON None.  FINDINGS Negative for fracture in the  pelvis. Both hips show mild degenerative change. Aortoiliac stent graft. Coils in the left hypogastric artery.  IMPRESSION Negative for fracture.  SIGNATURE  Electronically Signed   By: Franchot Gallo M.D.   On: 08/12/2013 12:47   Ct Head Wo Contrast  08/12/2013   CLINICAL DATA Patient on ground for 4 hr. Back pain. Patient fell this morning at 6 o'clock.  EXAM CT HEAD WITHOUT CONTRAST  CT CERVICAL SPINE WITHOUT CONTRAST  TECHNIQUE Multidetector CT imaging of the head and cervical spine was performed following the standard protocol without intravenous contrast. Multiplanar CT image reconstructions of the cervical spine were also generated.  COMPARISON CT HEAD W/O CM dated 06/11/2013  FINDINGS CT HEAD FINDINGS  There is no evidence of mass effect, midline shift, or extra-axial fluid collections. There is no evidence of a space-occupying lesion or intracranial hemorrhage. There is no evidence of a cortical-based area of acute infarction. There is generalized cerebral atrophy. There is periventricular white matter low attenuation likely secondary to microangiopathy.  The ventricles and sulci are appropriate for the patient's age. The basal cisterns are patent.  Visualized portions of the orbits are unremarkable. The visualized portions of the paranasal sinuses and mastoid air cells are unremarkable. Cerebrovascular atherosclerotic calcifications are noted.  The osseous structures are unremarkable.  CT CERVICAL SPINE FINDINGS  The alignment is anatomic. The vertebral body heights are maintained. There is no acute fracture. There is no static listhesis. The prevertebral soft tissues are normal. The intraspinal soft tissues are not fully imaged on this examination due to poor soft tissue contrast, but there is no gross soft tissue abnormality.  There is degenerative disc disease at C4-5, C5-6 and C6-7. There broad-based disc osteophyte complexes that C3-4, C4-5, C5-6 and C6-7. There is bilateral uncovertebral  degenerative changes C5-6 resulting in foraminal encroachment.  The visualized portions of the lung apices demonstrate no focal abnormality.  IMPRESSION 1. No acute intracranial pathology. 2. No acute osseous injury cervical spine. 3. Cervical spine spondylosis as described above. Evaluation the cervical spine is somewhat limited as the C7 and T1 levels are excluded from the field of view.  SIGNATURE  Electronically Signed   By: Kathreen Devoid   On: 08/12/2013 13:46   Ct Chest Wo Contrast  08/12/2013   CLINICAL DATA Fall.  Trauma.  Lower back pain.  History of L1 fracture.  EXAM CT CHEST, ABDOMEN AND PELVIS WITHOUT CONTRAST  TECHNIQUE Multidetector CT imaging of the chest, abdomen and pelvis was performed following the standard protocol without IV contrast.  COMPARISON 03/09/2013 abdominal CT.  FINDINGS CT CHEST FINDINGS  THORACIC INLET/BODY WALL:  No acute abnormality.  MEDIASTINUM:  Cardiomegaly. No pericardial effusion. Previous aortic valvular replacement. Extensive coronary artery atherosclerosis. No evidence of acute aortic injury. No  suspicious adenopathy.  LUNG WINDOWS:  There is airspace disease in the right lower lobe. Small to moderate right pleural effusion which is layering water density. Trace left pleural effusion. There is a tiny pneumothorax in the ventral and lower right chest.  OSSEOUS:  See below  CT ABDOMEN AND PELVIS FINDINGS  Liver: No evidence of injury.  Biliary: No evidence of biliary obstruction or stone.  Pancreas: Unremarkable.  Spleen: Minimal fluid around the spleen, which is low-density.  Adrenals: Unremarkable.  Kidneys and ureters: No evidence of injury. Bilateral atrophy. No hydronephrosis.  Bladder: Unremarkable.  Reproductive: Unremarkable.  Bowel: No wall thickening.  No obstruction  Retroperitoneum: No hematoma.  Peritoneum: Small volume free fluid around the spleen and in the pelvis. The fluid is water density. Pelvic fluid noted on previous examination.  Vascular: Status  post aorto bi-iliac stent graft. Coil embolization of the left hypogastric artery. Unchanged or smaller size of the left iliac artery aneurysm sac, 4 cm in diameter. Unchanged size of the aortic aneurysm sac, approximately 4 cm in maximal diameter. 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  OSSEOUS: Acute fractures of the right fourth, fifth, sixth, seventh, eighth, ninth ribs. There is up to 100% displacement inferiorly.  Multiple spinal fractures:  L5: Horizontal low-density cleft through the upper body. Compression is maximal posteriorly, approximately 25%. No subluxation.  L4: Inferior endplate fracture which is new from prior and likely acute.  L3: Superior endplate compression fracture with height loss less than 50%. There is minimal bony retropulsion, without significant canal narrowing.  L1: Remote compression fracture. Exaggerated kyphosis at this level.  T12: Remote appearing Schmorl's node in the superior endplate with surrounding sclerosis.  T3: Horizontal fracture through the upper body with mild compression. This fracture appears acute.  There is no evidence of spinal subluxation or acute posterior element fractures.  Critical Value/emergent results were called by telephone at the time of interpretation on 08/12/2013 at 2:36 PM to Dr. Shirlyn Goltz , who verbally acknowledged these results.  IMPRESSION 1. Acute right fourth through ninth rib fractures. 2. Acute compression type vertebral fractures of L5, L4, L3, T12, and T3. No spinal subluxation or significant bony retropulsion. 3. Trace right pneumothorax. 4. Moderate right pleural effusion which is primarily non hemorrhagic. There is a combination of atelectasis and pneumonia/aspiration in the right lower lobe. 5. Small volume fluid around the spleen without visible parenchymal abnormality; cannot exclude splenic injury without contrast. 6. Small volume pelvic ascites, chronic from October 2014. 7.  Non-traumatic findings noted above.  SIGNATURE  Electronically Signed   By: Jorje Guild M.D.   On: 08/12/2013 14:36   Ct Cervical Spine Wo Contrast  08/12/2013   CLINICAL DATA Patient on ground for 4 hr. Back pain. Patient fell this morning at 6 o'clock.  EXAM CT HEAD WITHOUT CONTRAST  CT CERVICAL SPINE WITHOUT CONTRAST  TECHNIQUE Multidetector CT imaging of the head and cervical spine was performed following the standard protocol without intravenous contrast. Multiplanar CT image reconstructions of the cervical spine were also generated.  COMPARISON CT HEAD W/O CM dated 06/11/2013  FINDINGS CT HEAD FINDINGS  There is no evidence of mass effect, midline shift, or extra-axial fluid collections. There is no evidence of a space-occupying lesion or intracranial hemorrhage. There is no evidence of a cortical-based area of acute infarction. There is generalized cerebral atrophy. There is periventricular white matter low attenuation likely secondary to microangiopathy.  The ventricles and  sulci are appropriate for the patient's age. The basal cisterns are patent.  Visualized portions of the orbits are unremarkable. The visualized portions of the paranasal sinuses and mastoid air cells are unremarkable. Cerebrovascular atherosclerotic calcifications are noted.  The osseous structures are unremarkable.  CT CERVICAL SPINE FINDINGS  The alignment is anatomic. The vertebral body heights are maintained. There is no acute fracture. There is no static listhesis. The prevertebral soft tissues are normal. The intraspinal soft tissues are not fully imaged on this examination due to poor soft tissue contrast, but there is no gross soft tissue abnormality.  There is degenerative disc disease at C4-5, C5-6 and C6-7. There broad-based disc osteophyte complexes that C3-4, C4-5, C5-6 and C6-7. There is bilateral uncovertebral degenerative changes C5-6 resulting in foraminal encroachment.  The visualized portions of the lung apices  demonstrate no focal abnormality.  IMPRESSION 1. No acute intracranial pathology. 2. No acute osseous injury cervical spine. 3. Cervical spine spondylosis as described above. Evaluation the cervical spine is somewhat limited as the C7 and T1 levels are excluded from the field of view.  SIGNATURE  Electronically Signed   By: Kathreen Devoid   On: 08/12/2013 13:46   Dg Chest Port 1 View  08/14/2013   CLINICAL DATA:  Right rib fractures, pneumothorax  EXAM: PORTABLE CHEST - 1 VIEW  COMPARISON:  Portable exam [id number] hr compared to 08/13/2013  FINDINGS: Right thoracostomy tube stable.  Enlargement of cardiac silhouette post MVR.  Atherosclerotic calcification aorta.  Mediastinal contours and pulmonary vascularity otherwise normal.  Tiny residual right apex pneumothorax, decreased.  Subsegmental atelectasis right base.  Atelectasis versus consolidation left lower lobe.  Probable underlying COPD with improved pulmonary edema.  No pneumothorax.  Bones demineralized.  IMPRESSION: Tiny residual right pneumothorax post thoracostomy tube.  Linear subsegmental atelectasis right lung base with atelectasis versus consolidation in left lower lobe.  Improved pulmonary edema.   Electronically Signed   By: Lavonia Dana M.D.   On: 08/14/2013 08:09   Dg Chest Port 1 View  08/13/2013   CLINICAL DATA Pneumothorax.  EXAM PORTABLE CHEST - 1 VIEW  COMPARISON DG CHEST 1V PORT dated 08/12/2013; CT CHEST W/O CM dated 08/12/2013  FINDINGS Right chest tube in stable position. Stable tiny right apical pneumothorax noted. Atelectatic changes in the lung bases. Developing infiltrate and/or atelectasis right mid lung field. Prior CABG. Severe cardiomegaly. Mild interstitial prominence noted. These findings suggest congestive heart failure. Multiple right rib fractures are present. These are better demonstrated on recent CT.  IMPRESSION 1. Right chest tube in stable position. Tiny stable right apical pneumothorax. 2. Congestive heart failure with mild  interstitial edema. 3. Bibasilar atelectasis. Developing atelectasis and/or infiltrate in the right mid lung noted. 4. Right rib fractures, better demonstrated by recent CT.  SIGNATURE  Electronically Signed   By: Marcello Moores  Register   On: 08/13/2013 07:56   Dg Chest Port 1 View  08/12/2013   CLINICAL DATA Chest tube placement  EXAM PORTABLE CHEST - 1 VIEW  COMPARISON 08/12/2013  FINDINGS Right chest tube placed in good position with decrease in right pleural effusion. Minimal right apical pneumothorax. Improved aeration in the right lung base  Cardiac enlargement. Improvement in pulmonary vascular congestion. Left lower lobe atelectasis unchanged.  IMPRESSION Right chest tube placement with decrease in right pleural effusion. Minimal right apical pneumothorax  Improvement in congestive heart failure.  Bibasilar atelectasis with improved aeration in the right lung base.  SIGNATURE  Electronically Signed   By: Juanda Crumble  Carlis Abbott M.D.   On: 08/12/2013 15:52     Assessment and Plan  1. Mechanical fall with recent frequent falls, and subsequent multiple rib fractures and vertebral compression fractures  2. R hemothorax s/p chest tube with continued bloody drainage 3. Acute on chronic anemia 4. Chronic atrial fibrillation with elevated heart rate on admission  5. Mildly elevated POC troponin with mild nonobstructive CAD by cath 2011, suspected due to demand, no planned further workup 6. 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 Chronic diastolic CHF due to hypertensive heart disease, primarily RHF, suspect some component of volume retention due to hypoalbuminemia 8. HTN  9. PVD s/p AAA/iliac aneurysm repair 01/2013   Receiving additional blood for anemia which is being managed by primary team - agree with transfusion. Likely related to hemothorax. Given multiple falls recently including this significant fall complicated by multiple fractures and anemia, he is no longer a  Coumadin candidate. Discussed with his primary cardiologist Dr. Mare Ferrari who agrees. Aspirin remains on hold in setting of continued anemia - will need to follow given recommendation to resume aspirin whenever possible given his abdominal aortic aneurysm/iliac repair history.  Will discuss Lasix with MD - suspected dehydration on admission, and he is below baseline dry weight of 165-170. With BUN up to 99, consider holding for tonight and resuming at lower dose in AM.  He was not on any AV nodal blocking agents prior to admission. Had some nocturnal bradycardia overnight with soft BPs. Will transition to short acting dilt this AM (dilt gtt off 1 hr after first dose) and if stable tomorrow, change to long-acting.  Signed, Melina Copa PA-C

## 2013-08-14 NOTE — Progress Notes (Signed)
Give another 5mg  Vit K. Will send R pleural fluid for cell count, LDH, and CX as it is serous and on admit CT was mostly serous.PT/OT Patient examined and I agree with the assessment and plan  Georganna Skeans, MD, MPH, FACS Trauma: [phone] General Surgery: [phone]  08/14/2013 11:12 AM

## 2013-08-14 NOTE — Clinical Social Work Note (Signed)
Clinical Social Work Department BRIEF PSYCHOSOCIAL ASSESSMENT 08/14/2013  Patient:  Evan Perez, Evan Perez     Account Number:  [id number]     Admit date:  08/12/2013  Clinical Social Worker:  Myles Lipps  Date/Time:  08/14/2013 03:30 PM  Referred by:  Physician  Date Referred:  08/14/2013 Referred for  SNF Placement   Other Referral:   Interview type:  Patient Other interview type:   No family at bedside    PSYCHOSOCIAL DATA Living Status:  FACILITY Admitted from facility:  [facility] Level of care:  Independent Living Primary support name:  Tilman Neat  [phone] Primary support relationship to patient:  FAMILY Degree of support available:   Strong    CURRENT CONCERNS Current Concerns  Post-Acute Placement   Other Concerns:    SOCIAL WORK ASSESSMENT / PLAN Clinical Social Worker met with patient at bedside to offer support and discuss patient needs at discharge.  Patient states that he fell getting up from the toilet in his apartment.  Patient currently lives at [facility] but is agreeable with ST-SNF placement at facility until able to return to his apartment.  CSW left message with Candace at [facility] regarding potential placement Monday.  Patient states that he spoke with someone at [facility] who plans to provide a rehab bed for him at discharge.    Clinical Social Worker inquired about current substance use.  Patient states that due to his medication regimen he has no desire to drink alcohol or use drugs.  SBIRT complete with no resources needed at this time.  CSW remains available for support and to facilitate patient discharge needs to [facility] once medically stable.   Assessment/plan status:  Psychosocial Support/Ongoing Assessment of Needs Other assessment/ plan:   Information/referral to community resources:   Patient declined resources, stating that he has lived at [facility] for 3 years and has good relationships there  if resources are needed.    PATIENT'S/FAMILY'S RESPONSE TO PLAN OF CARE: Patient alert and oriented x3 sitting up in the chair. Patient very engaged in conversation and assessment process.  Patient states that he feels very supported with his current living situation and is motivated to begin rehab at [facility].  Patient understanding of social work role and appreciative of of support and concern.

## 2013-08-14 NOTE — Progress Notes (Signed)
Patient ID: Evan Perez, male   DOB: [birth year], 80 y.o.   MRN: [id number] Afeb, vss. Did well getting out of bed with the brace on. No new neuro issues He only needs to wear the brace when he is out of bed. It can be put on with him sitting. Will continue to follow.

## 2013-08-14 NOTE — Progress Notes (Signed)
OT Cancellation Note  Patient Details Name: Evan Perez MRN: [id number] DOB: 08/23/1922   Cancelled Treatment:    Reason Eval/Treat Not Completed: Other (comment) Possible D/C to [facility] Monday. Will defer OT to SNF. If eval needed for admission, please text page OT at [phone]. Thank you [place], OTR/L  [phone] 08/14/2013 08/14/2013, 6:16 PM

## 2013-08-14 NOTE — Progress Notes (Signed)
Patient ID: Evan Perez, male   DOB: Oct 01, 1922, 80 y.o.   MRN: [id number]   LOS: 2 days   Subjective: No unexpected c/o. Sore but not taking pain meds, says would rather work through it. Coughed up some mucus this morning.   Objective: Vital signs in last 24 hours: Temp:  [97.3 F (36.3 C)-98.5 F (36.9 C)] 97.6 F (36.4 C) (03/13 0723) Pulse Rate:  [62-85] 85 (03/13 0723) Resp:  [13-24] 16 (03/13 0723) BP: (96-163)/(47-69) 133/64 mmHg (03/13 0723) SpO2:  [90 %-100 %] 100 % (03/13 0723) Weight:  [161 lb 13.1 oz (73.4 kg)] 161 lb 13.1 oz (73.4 kg) (03/13 0500) Last BM Date:  (prior to admission)   CT No air leak 613ml/24h @750ml    Laboratory  CBC  Recent Labs  08/13/13 0200 08/14/13 0334  WBC 6.8 8.8  HGB 7.2* 6.6*  HCT 22.0* 20.1*  PLT 148* 129*   BMET  Recent Labs  08/13/13 0200 08/14/13 0334  NA 141 137  K 3.8 4.0  CL 96 96  CO2 33* 28  GLUCOSE 146* 105*  BUN 93* 99*  CREATININE 1.70* 1.85*  CALCIUM 8.4 7.9*   Lab Results  Component Value Date   INR 1.85* 08/14/2013   INR 2.37* 08/13/2013   INR 2.08* 08/12/2013    Radiology Results PORTABLE CHEST - 1 VIEW  COMPARISON: Portable exam 0554 hr compared to 08/13/2013  FINDINGS:  Right thoracostomy tube stable.  Enlargement of cardiac silhouette post MVR.  Atherosclerotic calcification aorta.  Mediastinal contours and pulmonary vascularity otherwise normal.  Tiny residual right apex pneumothorax, decreased.  Subsegmental atelectasis right base.  Atelectasis versus consolidation left lower lobe.  Probable underlying COPD with improved pulmonary edema.  No pneumothorax.  Bones demineralized.  IMPRESSION:  Tiny residual right pneumothorax post thoracostomy tube.  Linear subsegmental atelectasis right lung base with atelectasis  versus consolidation in left lower lobe.  Improved pulmonary edema.  Electronically Signed  By: Lavonia Dana M.D.  On: 08/14/2013 08:09   Physical Exam General  appearance: alert and no distress Resp: clear to auscultation bilaterally Cardio: irregularly irregular rhythm GI: Distended, NT, +BS (He notes distension in normal for him)   Assessment/Plan: Fall  Multiple right rib fxs w/HTX s/p CT -- Will put CT to water seal. Has not had IS since admission apparently. RN to correct. Will send sputum culture with discolored sputum this morning but no other s/sx of infection noted L2-4 compression fxs -- NS recommends lumbar corset Afib w/RVR and elevated troponins -- Appreciate cardiology consult, rate controlled, converting to orals Acute on chronic anemia -- Will give 1 unit PRBC, repeat CBC in am Anticoagulated on coumadin -- Slowly drifting down Multiple medical problems -- Stable, on home meds except coumadin FEN -- No issues VTE -- SCD's, can start Lovenox once hgb stable Dispo -- Continue SDU today to continue aggressive pulmonary toilet. Will need SNF placement at his facility when ready for d/c.    Lisette Abu, PA-C Pager: [phone] General Trauma PA Pager: [phone]  08/14/2013

## 2013-08-15 ENCOUNTER — Inpatient Hospital Stay (HOSPITAL_COMMUNITY): Payer: Medicare Other

## 2013-08-15 LAB — CBC
HCT: 23.1 % — ABNORMAL LOW (ref 39.0–52.0)
HEMOGLOBIN: 7.7 g/dL — AB (ref 13.0–17.0)
MCH: 29.8 pg (ref 26.0–34.0)
MCHC: 33.3 g/dL (ref 30.0–36.0)
MCV: 89.5 fL (ref 78.0–100.0)
PLATELETS: 128 10*3/uL — AB (ref 150–400)
RBC: 2.58 MIL/uL — AB (ref 4.22–5.81)
RDW: 18.7 % — ABNORMAL HIGH (ref 11.5–15.5)
WBC: 7.5 10*3/uL (ref 4.0–10.5)

## 2013-08-15 LAB — TYPE AND SCREEN
ABO/RH(D): A POS
ANTIBODY SCREEN: NEGATIVE
UNIT DIVISION: 0
UNIT DIVISION: 0

## 2013-08-15 LAB — BASIC METABOLIC PANEL
BUN: 87 mg/dL — ABNORMAL HIGH (ref 6–23)
CO2: 29 mEq/L (ref 19–32)
Calcium: 7.9 mg/dL — ABNORMAL LOW (ref 8.4–10.5)
Chloride: 92 mEq/L — ABNORMAL LOW (ref 96–112)
Creatinine, Ser: 1.82 mg/dL — ABNORMAL HIGH (ref 0.50–1.35)
GFR, EST AFRICAN AMERICAN: 37 mL/min — AB (ref >90)
GFR, EST NON AFRICAN AMERICAN: 32 mL/min — AB (ref >90)
Glucose, Bld: 107 mg/dL — ABNORMAL HIGH (ref 70–99)
Potassium: 3.7 mEq/L (ref 3.7–5.3)
SODIUM: 132 meq/L — AB (ref 137–147)

## 2013-08-15 LAB — PROTIME-INR
INR: 1.3 (ref 0.00–1.49)
PROTHROMBIN TIME: 15.9 s — AB (ref 11.6–15.2)

## 2013-08-15 LAB — PH, BODY FLUID: PH, FLUID: 8.5

## 2013-08-15 NOTE — Progress Notes (Signed)
Subjective: Pt doing well.  Working on IS-750cc No acute events overnight.  Objective: Vital signs in last 24 hours: Temp:  [97.3 F (36.3 C)-98.4 F (36.9 C)] 97.5 F (36.4 C) (03/14 0759) Pulse Rate:  [67-83] 74 (03/14 0800) Resp:  [13-20] 15 (03/14 0800) BP: (122-146)/(55-69) 125/59 mmHg (03/14 0800) SpO2:  [94 %-100 %] 94 % (03/14 0800) Weight:  [163 lb 5.8 oz (74.1 kg)] 163 lb 5.8 oz (74.1 kg) (03/14 0300) Last BM Date:  (prior to admission)  Intake/Output from previous day: 03/13 0701 - 03/14 0700 In: 807.5 [P.O.:360; I.V.:135; Blood:312.5] Out: 1900 [Urine:1200; Chest Tube:700] Intake/Output this shift: Total I/O In: -  Out: 375 [Urine:175; Chest Tube:200]  General appearance: alert and cooperative Resp: clear to auscultation bilaterally Cardio: RR GI: soft, non-tender; bowel sounds normal; no masses,  no organomegaly R CT in place  Lab Results:   Recent Labs  08/14/13 0334 08/15/13 0320  WBC 8.8 7.5  HGB 6.6* 7.7*  HCT 20.1* 23.1*  PLT 129* 128*   BMET  Recent Labs  08/14/13 0334 08/15/13 0320  NA 137 132*  K 4.0 3.7  CL 96 92*  CO2 28 29  GLUCOSE 105* 107*  BUN 99* 87*  CREATININE 1.85* 1.82*  CALCIUM 7.9* 7.9*   PT/INR  Recent Labs  08/14/13 0334 08/15/13 0320  LABPROT 20.8* 15.9*  INR 1.85* 1.30   ABG No results found for this basename: PHART, PCO2, PO2, HCO3,  in the last 72 hours  Studies/Results: Dg Chest Port 1 View  08/15/2013   CLINICAL DATA:  The pneumothorax  EXAM: PORTABLE CHEST - 1 VIEW  COMPARISON:  Prior chest x-ray 08/14/2013  FINDINGS: Trace right apical pneumothorax without significant interval change. The right-sided chest tube remains in unchanged position. Persistent right middle lobe atelectasis. Small left pleural effusion with associated atelectasis. Stable cardiomegaly. Atherosclerotic calcifications in the transverse aorta. No acute osseous abnormality.  IMPRESSION: 1. Stable trace right apical pneumothorax  with chest tube in place. 2. Persistent right middle lobe atelectasis. 3. Unchanged left pleural effusion and basilar atelectasis.   Electronically Signed   By: Jacqulynn Cadet M.D.   On: 08/15/2013 07:55   Dg Chest Port 1 View  08/14/2013   CLINICAL DATA:  Right rib fractures, pneumothorax  EXAM: PORTABLE CHEST - 1 VIEW  COMPARISON:  Portable exam 2585 hr compared to 08/13/2013  FINDINGS: Right thoracostomy tube stable.  Enlargement of cardiac silhouette post MVR.  Atherosclerotic calcification aorta.  Mediastinal contours and pulmonary vascularity otherwise normal.  Tiny residual right apex pneumothorax, decreased.  Subsegmental atelectasis right base.  Atelectasis versus consolidation left lower lobe.  Probable underlying COPD with improved pulmonary edema.  No pneumothorax.  Bones demineralized.  IMPRESSION: Tiny residual right pneumothorax post thoracostomy tube.  Linear subsegmental atelectasis right lung base with atelectasis versus consolidation in left lower lobe.  Improved pulmonary edema.   Electronically Signed   By: Lavonia Dana M.D.   On: 08/14/2013 08:09    Anti-infectives: Anti-infectives   None      Assessment/Plan: Fall  Multiple right rib fxs w/HTX s/p CT --  CT to water seal. Con't  IS L2-4 compression fxs -- NS recommends lumbar corset Afib w/RVR and elevated troponins -- Appreciate cardiology consult, rate controlled, converting to orals Acute on chronic anemia -- Hct resp approp Anticoagulated on coumadin -- Slowly drifting down Multiple medical problems -- Stable, on home meds except coumadin FEN -- No issues VTE -- SCD's, can start Lovenox once hgb  stable Dispo -- Continue SDU today to continue aggressive pulmonary toilet. Will need SNF placement at his facility when ready for d/c   LOS: 3 days    Rosario Jacks., [facility] 08/15/2013

## 2013-08-15 NOTE — Progress Notes (Signed)
PCCM- Pleural fluid c/w traumatic at this point. PCCM will sign off. Please call if needed.   CD Annamaria Boots, MD PCCM [phone]

## 2013-08-15 NOTE — Progress Notes (Signed)
Patient Name: Evan Perez Date of Encounter: 08/15/2013     Principal Problem:   Fall from standing Active Problems:   Chronic atrial fibrillation- rapid AF on admission 08/12/13   Severe AS s/p bovine tissue AVR 2011   Chronic anticoagulation   Hemothorax on right   Chronic diastolic CHF (congestive heart failure)   CKD (chronic kidney disease), stage III   Anemia   Elevated troponin- suspected Type 2 MI   AAA (abdominal aortic aneurysm) s/p endovascular repair 01/2013   Hypertension   Hypertensive heart disease   CAD (coronary artery disease) - nonobstructive by cath 2011   Multiple fractures of ribs of right side   Hemothorax   Pleural effusion    SUBJECTIVE  Feels better. In no respiratory distress. Rhythm is atrial fib with controlled VR.  CURRENT MEDS . Chlorhexidine Gluconate Cloth  6 each Topical Q0600  . cloNIDine  0.2 mg Oral BID  . diltiazem  30 mg Oral 4 times per day  . docusate sodium  100 mg Oral BID  . levothyroxine  25 mcg Oral QAC breakfast  . multivitamin with minerals  1 tablet Oral Daily  . mupirocin ointment  1 application Nasal BID  . polyethylene glycol  17 g Oral Daily    OBJECTIVE  Filed Vitals:   08/14/13 2300 08/15/13 0300 08/15/13 0759 08/15/13 0800  BP: 122/58 137/67  125/59  Pulse: 75 68  74  Temp: 97.8 F (36.6 C) 97.4 F (36.3 C) 97.5 F (36.4 C)   TempSrc: Oral Oral Oral   Resp: 17 13  15   Height:      Weight:  163 lb 5.8 oz (74.1 kg)    SpO2: 98% 94%  94%    Intake/Output Summary (Last 24 hours) at 08/15/13 1126 Last data filed at 08/15/13 0900  Gross per 24 hour  Intake    315 ml  Output   2075 ml  Net  -1760 ml   Filed Weights   08/12/13 1635 08/14/13 0500 08/15/13 0300  Weight: 158 lb 11.7 oz (72 kg) 161 lb 13.1 oz (73.4 kg) 163 lb 5.8 oz (74.1 kg)    PHYSICAL EXAM  General: Pleasant, NAD. Neuro: Alert and oriented X 3. Moves all extremities spontaneously. Psych: Normal affect. HEENT:  Normal  Neck:  Supple without bruits or JVD. Lungs:  Resp regular and unlabored, Lungs clear anteriorly. Heart: Irregular rhythm. Good prosthetic aortic closure sound. Abdomen: Soft, non-tender, non-distended, BS + x 4.  Extremities: ACE wrapped.  Accessory Clinical Findings  CBC  Recent Labs  08/12/13 1155  08/14/13 0334 08/15/13 0320  WBC 8.9  < > 8.8 7.5  NEUTROABS 7.6  --   --   --   HGB 8.8*  < > 6.6* 7.7*  HCT 27.9*  < > 20.1* 23.1*  MCV 88.6  < > 89.3 89.5  PLT 184  < > 129* 128*  < > = values in this interval not displayed. Basic Metabolic Panel  Recent Labs  08/14/13 0334 08/15/13 0320  NA 137 132*  K 4.0 3.7  CL 96 92*  CO2 28 29  GLUCOSE 105* 107*  BUN 99* 87*  CREATININE 1.85* 1.82*  CALCIUM 7.9* 7.9*   Liver Function Tests  Recent Labs  08/12/13 1155 08/13/13 0200  AST 56* 36  ALT 25 21  ALKPHOS 106 90  BILITOT 0.6 0.5  PROT 7.9 6.7  ALBUMIN 2.6* 2.3*   No results found for this basename: LIPASE, AMYLASE,  in the last 72 hours Cardiac Enzymes  Recent Labs  08/12/13 1438  CKTOTAL 183   BNP No components found with this basename: POCBNP,  D-Dimer No results found for this basename: DDIMER,  in the last 72 hours Hemoglobin A1C No results found for this basename: HGBA1C,  in the last 72 hours Fasting Lipid Panel No results found for this basename: CHOL, HDL, LDLCALC, TRIG, CHOLHDL, LDLDIRECT,  in the last 72 hours Thyroid Function Tests No results found for this basename: TSH, T4TOTAL, FREET3, T3FREE, THYROIDAB,  in the last 72 hours  TELE  Atrial fib with controlled VR on cardizem  ECG    Radiology/Studies  Ct Abdomen Pelvis Wo Contrast  08/12/2013   CLINICAL DATA Fall.  Trauma.  Lower back pain.  History of L1 fracture.  EXAM CT CHEST, ABDOMEN AND PELVIS WITHOUT CONTRAST  TECHNIQUE Multidetector CT imaging of the chest, abdomen and pelvis was performed following the standard protocol without IV contrast.  COMPARISON 03/09/2013 abdominal CT.   FINDINGS CT CHEST FINDINGS  THORACIC INLET/BODY WALL:  No acute abnormality.  MEDIASTINUM:  Cardiomegaly. No pericardial effusion. Previous aortic valvular replacement. Extensive coronary artery atherosclerosis. No evidence of acute aortic injury. No suspicious adenopathy.  LUNG WINDOWS:  There is airspace disease in the right lower lobe. Small to moderate right pleural effusion which is layering water density. Trace left pleural effusion. There is a tiny pneumothorax in the ventral and lower right chest.  OSSEOUS:  See below  CT ABDOMEN AND PELVIS FINDINGS  Liver: No evidence of injury.  Biliary: No evidence of biliary obstruction or stone.  Pancreas: Unremarkable.  Spleen: Minimal fluid around the spleen, which is low-density.  Adrenals: Unremarkable.  Kidneys and ureters: No evidence of injury. Bilateral atrophy. No hydronephrosis.  Bladder: Unremarkable.  Reproductive: Unremarkable.  Bowel: No wall thickening.  No obstruction  Retroperitoneum: No hematoma.  Peritoneum: Small volume free fluid around the spleen and in the pelvis. The fluid is water density. Pelvic fluid noted on previous examination.  Vascular: Status post aorto bi-iliac stent graft. Coil embolization of the left hypogastric artery. Unchanged or smaller size of the left iliac artery aneurysm sac, 4 cm in diameter. Unchanged size of the aortic aneurysm sac, approximately 4 cm in maximal diameter. 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  OSSEOUS: Acute fractures of the right fourth, fifth, sixth, seventh, eighth, ninth ribs. There is up to 100% displacement inferiorly.  Multiple spinal fractures:  L5: Horizontal low-density cleft through the upper body. Compression is maximal posteriorly, approximately 25%. No subluxation.  L4: Inferior endplate fracture which is new from prior and likely acute.  L3: Superior endplate compression fracture with height loss less than 50%. There is minimal bony  retropulsion, without significant canal narrowing.  L1: Remote compression fracture. Exaggerated kyphosis at this level.  T12: Remote appearing Schmorl's node in the superior endplate with surrounding sclerosis.  T3: Horizontal fracture through the upper body with mild compression. This fracture appears acute.  There is no evidence of spinal subluxation or acute posterior element fractures.  Critical Value/emergent results were called by telephone at the time of interpretation on 08/12/2013 at 2:36 PM to Dr. Shirlyn Goltz , who verbally acknowledged these results.  IMPRESSION 1. Acute right fourth through ninth rib fractures. 2. Acute compression type vertebral fractures of L5, L4, L3, T12, and T3. No spinal subluxation or significant bony retropulsion. 3. Trace right pneumothorax. 4. Moderate right pleural effusion  which is primarily non hemorrhagic. There is a combination of atelectasis and pneumonia/aspiration in the right lower lobe. 5. Small volume fluid around the spleen without visible parenchymal abnormality; cannot exclude splenic injury without contrast. 6. Small volume pelvic ascites, chronic from October 2014. 7. Non-traumatic findings noted above.  SIGNATURE  Electronically Signed   By: Jorje Guild M.D.   On: 08/12/2013 14:36   Dg Chest 1 View  08/12/2013   CLINICAL DATA Fall  EXAM CHEST - 1 VIEW  COMPARISON 01/29/2013  FINDINGS Cardiac enlargement with vascular congestion and mild fluid overload. Small right pleural effusion has developed since the prior study. Mild bibasilar atelectasis. Underlying COPD.  Fractures of the right sixth and eighth and ninth ribs.  IMPRESSION Right rib fractures. Small right effusion may be due to heart failure or hemothorax.  Cardiac enlargement with mild fluid overload.  SIGNATURE  Electronically Signed   By: Franchot Gallo M.D.   On: 08/12/2013 12:47   Dg Lumbar Spine Complete  08/12/2013   CLINICAL DATA Fall  EXAM LUMBAR SPINE - COMPLETE 4+ VIEW  COMPARISON  03/09/2013  FINDINGS Aortoiliac stent graft in satisfactory position. Coils in the left hypogastric artery.  Chronic compression fractures of T12 and L1 are unchanged. There are new endplate deformities at L3, L4, and L5 consistent with recent fractures. MRI may be helpful for further evaluation of the spinal canal and to date these fractures.  IMPRESSION Mild fractures at L3, L4, and L5 not present on 03/09/2013 and possibly acute. Chronic fractures T12 and L1.  SIGNATURE  Electronically Signed   By: Franchot Gallo M.D.   On: 08/12/2013 12:50   Dg Pelvis 1-2 Views  08/12/2013   CLINICAL DATA Fall  EXAM PELVIS - 1-2 VIEW  COMPARISON None.  FINDINGS Negative for fracture in the pelvis. Both hips show mild degenerative change. Aortoiliac stent graft. Coils in the left hypogastric artery.  IMPRESSION Negative for fracture.  SIGNATURE  Electronically Signed   By: Franchot Gallo M.D.   On: 08/12/2013 12:47   Ct Head Wo Contrast  08/12/2013   CLINICAL DATA Patient on ground for 4 hr. Back pain. Patient fell this morning at 6 o'clock.  EXAM CT HEAD WITHOUT CONTRAST  CT CERVICAL SPINE WITHOUT CONTRAST  TECHNIQUE Multidetector CT imaging of the head and cervical spine was performed following the standard protocol without intravenous contrast. Multiplanar CT image reconstructions of the cervical spine were also generated.  COMPARISON CT HEAD W/O CM dated 06/11/2013  FINDINGS CT HEAD FINDINGS  There is no evidence of mass effect, midline shift, or extra-axial fluid collections. There is no evidence of a space-occupying lesion or intracranial hemorrhage. There is no evidence of a cortical-based area of acute infarction. There is generalized cerebral atrophy. There is periventricular white matter low attenuation likely secondary to microangiopathy.  The ventricles and sulci are appropriate for the patient's age. The basal cisterns are patent.  Visualized portions of the orbits are unremarkable. The visualized portions of the  paranasal sinuses and mastoid air cells are unremarkable. Cerebrovascular atherosclerotic calcifications are noted.  The osseous structures are unremarkable.  CT CERVICAL SPINE FINDINGS  The alignment is anatomic. The vertebral body heights are maintained. There is no acute fracture. There is no static listhesis. The prevertebral soft tissues are normal. The intraspinal soft tissues are not fully imaged on this examination due to poor soft tissue contrast, but there is no gross soft tissue abnormality.  There is degenerative disc disease at C4-5, C5-6 and  C6-7. There broad-based disc osteophyte complexes that C3-4, C4-5, C5-6 and C6-7. There is bilateral uncovertebral degenerative changes C5-6 resulting in foraminal encroachment.  The visualized portions of the lung apices demonstrate no focal abnormality.  IMPRESSION 1. No acute intracranial pathology. 2. No acute osseous injury cervical spine. 3. Cervical spine spondylosis as described above. Evaluation the cervical spine is somewhat limited as the C7 and T1 levels are excluded from the field of view.  SIGNATURE  Electronically Signed   By: Kathreen Devoid   On: 08/12/2013 13:46   Ct Chest Wo Contrast  08/12/2013   CLINICAL DATA Fall.  Trauma.  Lower back pain.  History of L1 fracture.  EXAM CT CHEST, ABDOMEN AND PELVIS WITHOUT CONTRAST  TECHNIQUE Multidetector CT imaging of the chest, abdomen and pelvis was performed following the standard protocol without IV contrast.  COMPARISON 03/09/2013 abdominal CT.  FINDINGS CT CHEST FINDINGS  THORACIC INLET/BODY WALL:  No acute abnormality.  MEDIASTINUM:  Cardiomegaly. No pericardial effusion. Previous aortic valvular replacement. Extensive coronary artery atherosclerosis. No evidence of acute aortic injury. No suspicious adenopathy.  LUNG WINDOWS:  There is airspace disease in the right lower lobe. Small to moderate right pleural effusion which is layering water density. Trace left pleural effusion. There is a tiny  pneumothorax in the ventral and lower right chest.  OSSEOUS:  See below  CT ABDOMEN AND PELVIS FINDINGS  Liver: No evidence of injury.  Biliary: No evidence of biliary obstruction or stone.  Pancreas: Unremarkable.  Spleen: Minimal fluid around the spleen, which is low-density.  Adrenals: Unremarkable.  Kidneys and ureters: No evidence of injury. Bilateral atrophy. No hydronephrosis.  Bladder: Unremarkable.  Reproductive: Unremarkable.  Bowel: No wall thickening.  No obstruction  Retroperitoneum: No hematoma.  Peritoneum: Small volume free fluid around the spleen and in the pelvis. The fluid is water density. Pelvic fluid noted on previous examination.  Vascular: Status post aorto bi-iliac stent graft. Coil embolization of the left hypogastric artery. Unchanged or smaller size of the left iliac artery aneurysm sac, 4 cm in diameter. Unchanged size of the aortic aneurysm sac, approximately 4 cm in maximal diameter. 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  OSSEOUS: Acute fractures of the right fourth, fifth, sixth, seventh, eighth, ninth ribs. There is up to 100% displacement inferiorly.  Multiple spinal fractures:  L5: Horizontal low-density cleft through the upper body. Compression is maximal posteriorly, approximately 25%. No subluxation.  L4: Inferior endplate fracture which is new from prior and likely acute.  L3: Superior endplate compression fracture with height loss less than 50%. There is minimal bony retropulsion, without significant canal narrowing.  L1: Remote compression fracture. Exaggerated kyphosis at this level.  T12: Remote appearing Schmorl's node in the superior endplate with surrounding sclerosis.  T3: Horizontal fracture through the upper body with mild compression. This fracture appears acute.  There is no evidence of spinal subluxation or acute posterior element fractures.  Critical Value/emergent results were called by telephone at the time of  interpretation on 08/12/2013 at 2:36 PM to Dr. Shirlyn Goltz , who verbally acknowledged these results.  IMPRESSION 1. Acute right fourth through ninth rib fractures. 2. Acute compression type vertebral fractures of L5, L4, L3, T12, and T3. No spinal subluxation or significant bony retropulsion. 3. Trace right pneumothorax. 4. Moderate right pleural effusion which is primarily non hemorrhagic. There is a combination of atelectasis and pneumonia/aspiration in the right lower lobe. 5. Small volume  fluid around the spleen without visible parenchymal abnormality; cannot exclude splenic injury without contrast. 6. Small volume pelvic ascites, chronic from October 2014. 7. Non-traumatic findings noted above.  SIGNATURE  Electronically Signed   By: Jorje Guild M.D.   On: 08/12/2013 14:36   Ct Cervical Spine Wo Contrast  08/12/2013   CLINICAL DATA Patient on ground for 4 hr. Back pain. Patient fell this morning at 6 o'clock.  EXAM CT HEAD WITHOUT CONTRAST  CT CERVICAL SPINE WITHOUT CONTRAST  TECHNIQUE Multidetector CT imaging of the head and cervical spine was performed following the standard protocol without intravenous contrast. Multiplanar CT image reconstructions of the cervical spine were also generated.  COMPARISON CT HEAD W/O CM dated 06/11/2013  FINDINGS CT HEAD FINDINGS  There is no evidence of mass effect, midline shift, or extra-axial fluid collections. There is no evidence of a space-occupying lesion or intracranial hemorrhage. There is no evidence of a cortical-based area of acute infarction. There is generalized cerebral atrophy. There is periventricular white matter low attenuation likely secondary to microangiopathy.  The ventricles and sulci are appropriate for the patient's age. The basal cisterns are patent.  Visualized portions of the orbits are unremarkable. The visualized portions of the paranasal sinuses and mastoid air cells are unremarkable. Cerebrovascular atherosclerotic calcifications are noted.   The osseous structures are unremarkable.  CT CERVICAL SPINE FINDINGS  The alignment is anatomic. The vertebral body heights are maintained. There is no acute fracture. There is no static listhesis. The prevertebral soft tissues are normal. The intraspinal soft tissues are not fully imaged on this examination due to poor soft tissue contrast, but there is no gross soft tissue abnormality.  There is degenerative disc disease at C4-5, C5-6 and C6-7. There broad-based disc osteophyte complexes that C3-4, C4-5, C5-6 and C6-7. There is bilateral uncovertebral degenerative changes C5-6 resulting in foraminal encroachment.  The visualized portions of the lung apices demonstrate no focal abnormality.  IMPRESSION 1. No acute intracranial pathology. 2. No acute osseous injury cervical spine. 3. Cervical spine spondylosis as described above. Evaluation the cervical spine is somewhat limited as the C7 and T1 levels are excluded from the field of view.  SIGNATURE  Electronically Signed   By: Kathreen Devoid   On: 08/12/2013 13:46   Dg Chest Port 1 View  08/15/2013   CLINICAL DATA:  The pneumothorax  EXAM: PORTABLE CHEST - 1 VIEW  COMPARISON:  Prior chest x-ray 08/14/2013  FINDINGS: Trace right apical pneumothorax without significant interval change. The right-sided chest tube remains in unchanged position. Persistent right middle lobe atelectasis. Small left pleural effusion with associated atelectasis. Stable cardiomegaly. Atherosclerotic calcifications in the transverse aorta. No acute osseous abnormality.  IMPRESSION: 1. Stable trace right apical pneumothorax with chest tube in place. 2. Persistent right middle lobe atelectasis. 3. Unchanged left pleural effusion and basilar atelectasis.   Electronically Signed   By: Jacqulynn Cadet M.D.   On: 08/15/2013 07:55   Dg Chest Port 1 View  08/14/2013   CLINICAL DATA:  Right rib fractures, pneumothorax  EXAM: PORTABLE CHEST - 1 VIEW  COMPARISON:  Portable exam [id number] hr compared  to 08/13/2013  FINDINGS: Right thoracostomy tube stable.  Enlargement of cardiac silhouette post MVR.  Atherosclerotic calcification aorta.  Mediastinal contours and pulmonary vascularity otherwise normal.  Tiny residual right apex pneumothorax, decreased.  Subsegmental atelectasis right base.  Atelectasis versus consolidation left lower lobe.  Probable underlying COPD with improved pulmonary edema.  No pneumothorax.  Bones demineralized.  IMPRESSION:  Tiny residual right pneumothorax post thoracostomy tube.  Linear subsegmental atelectasis right lung base with atelectasis versus consolidation in left lower lobe.  Improved pulmonary edema.   Electronically Signed   By: Lavonia Dana M.D.   On: 08/14/2013 08:09   Dg Chest Port 1 View  08/13/2013   CLINICAL DATA Pneumothorax.  EXAM PORTABLE CHEST - 1 VIEW  COMPARISON DG CHEST 1V PORT dated 08/12/2013; CT CHEST W/O CM dated 08/12/2013  FINDINGS Right chest tube in stable position. Stable tiny right apical pneumothorax noted. Atelectatic changes in the lung bases. Developing infiltrate and/or atelectasis right mid lung field. Prior CABG. Severe cardiomegaly. Mild interstitial prominence noted. These findings suggest congestive heart failure. Multiple right rib fractures are present. These are better demonstrated on recent CT.  IMPRESSION 1. Right chest tube in stable position. Tiny stable right apical pneumothorax. 2. Congestive heart failure with mild interstitial edema. 3. Bibasilar atelectasis. Developing atelectasis and/or infiltrate in the right mid lung noted. 4. Right rib fractures, better demonstrated by recent CT.  SIGNATURE  Electronically Signed   By: Marcello Moores  Register   On: 08/13/2013 07:56   Dg Chest Port 1 View  08/12/2013   CLINICAL DATA Chest tube placement  EXAM PORTABLE CHEST - 1 VIEW  COMPARISON 08/12/2013  FINDINGS Right chest tube placed in good position with decrease in right pleural effusion. Minimal right apical pneumothorax. Improved aeration in  the right lung base  Cardiac enlargement. Improvement in pulmonary vascular congestion. Left lower lobe atelectasis unchanged.  IMPRESSION Right chest tube placement with decrease in right pleural effusion. Minimal right apical pneumothorax  Improvement in congestive heart failure.  Bibasilar atelectasis with improved aeration in the right lung base.  SIGNATURE  Electronically Signed   By: Franchot Gallo M.D.   On: 08/12/2013 15:52    ASSESSMENT AND PLAN 1. Permanent atrial fibrillation. No longer a candidate for coumadin going forward because of multiple falls. 2. S?P bioprosthetic aortic valve replacement. 3. Blood loss anemia improving. 4. Multiple right rib fractures with hemothorax. 5. Lumbar 2-4 compression fractures. 6. History of renal insufficiency.  Creatinine slightly improved currently off lasix.  Signed, Darlin Coco MD

## 2013-08-16 ENCOUNTER — Inpatient Hospital Stay (HOSPITAL_COMMUNITY): Payer: Medicare Other

## 2013-08-16 LAB — CBC
HEMATOCRIT: 26.2 % — AB (ref 39.0–52.0)
Hemoglobin: 8.4 g/dL — ABNORMAL LOW (ref 13.0–17.0)
MCH: 29.3 pg (ref 26.0–34.0)
MCHC: 32.1 g/dL (ref 30.0–36.0)
MCV: 91.3 fL (ref 78.0–100.0)
Platelets: 139 10*3/uL — ABNORMAL LOW (ref 150–400)
RBC: 2.87 MIL/uL — ABNORMAL LOW (ref 4.22–5.81)
RDW: 19.1 % — ABNORMAL HIGH (ref 11.5–15.5)
WBC: 10.3 10*3/uL (ref 4.0–10.5)

## 2013-08-16 NOTE — Progress Notes (Signed)
LOS: 4 days   Subjective: Pt c/o cough and more pain in right side.  He's been up mobilizing more.  No N/V, tolerating diet well.  No BM yet.  Urinating well.  Using IS and at 1000.  Objective: Vital signs in last 24 hours: Temp:  [97.5 F (36.4 C)-98.5 F (36.9 C)] 97.7 F (36.5 C) (03/15 0700) Pulse Rate:  [75-84] 75 (03/15 0441) Resp:  [15-19] 19 (03/15 0441) BP: (139-172)/(65-78) 139/68 mmHg (03/15 0441) SpO2:  [91 %-100 %] 91 % (03/15 0441) Weight:  [164 lb 14.5 oz (74.8 kg)] 164 lb 14.5 oz (74.8 kg) (03/15 0441) Last BM Date:  (prior to admission)  Lab Results:  CBC  Recent Labs  08/14/13 0334 08/15/13 0320  WBC 8.8 7.5  HGB 6.6* 7.7*  HCT 20.1* 23.1*  PLT 129* 128*   BMET  Recent Labs  08/14/13 0334 08/15/13 0320  NA 137 132*  K 4.0 3.7  CL 96 92*  CO2 28 29  GLUCOSE 105* 107*  BUN 99* 87*  CREATININE 1.85* 1.82*  CALCIUM 7.9* 7.9*    Imaging: Dg Chest Port 1 View  08/15/2013   CLINICAL DATA:  The pneumothorax  EXAM: PORTABLE CHEST - 1 VIEW  COMPARISON:  Prior chest x-ray 08/14/2013  FINDINGS: Trace right apical pneumothorax without significant interval change. The right-sided chest tube remains in unchanged position. Persistent right middle lobe atelectasis. Small left pleural effusion with associated atelectasis. Stable cardiomegaly. Atherosclerotic calcifications in the transverse aorta. No acute osseous abnormality.  IMPRESSION: 1. Stable trace right apical pneumothorax with chest tube in place. 2. Persistent right middle lobe atelectasis. 3. Unchanged left pleural effusion and basilar atelectasis.   Electronically Signed   By: Jacqulynn Cadet M.D.   On: 08/15/2013 07:55     PE: General: pleasant, WD/WN white male who is laying in bed in NAD, coughing HEENT: head is normocephalic, atraumatic.  Sclera are noninjected.  PERRL.  Ears and nose without any masses or lesions.  Mouth is pink and moist Heart:  IR/IR, rate controlled.  Normal s1,s2.  Murmur  noted (?bovine heart valve).  Palpable radial and pedal pulses bilaterally Lungs: CTAB, no wheezes, rhonchi, or rales noted.  Respiratory effort nonlabored, right chest wall tenderness Abd: soft, NT/ND, +BS, no masses, hernias, or organomegaly MS: distal CSM intact b/l Skin: warm and dry, B/l LE skin changes noted, wrapped with coban and elevated off heals Psych: A&Ox3 with an appropriate affect.  CT: No leak on WS Readings on pleurovac at 1560mL and 947mL = 2,468mL (1657mL/24 hours) serosanguinous drainage  Assessment/Plan: Fall  Multiple right rib fxs w/HTX s/p CT -- cont CT to water seal. Output 16106ml/24 hours.  Con't IS  Exudative pleural effusion - NGTD on cx, Pulm said likely due to trauma and not infectious L2-4 compression fxs -- NS recommends lumbar corset  Afib w/RVR and elevated troponins -- Appreciate cardiology consult, rate controlled, converting to orals  Acute on chronic anemia -- Hgb improving, recheck labs tomorrow Anticoagulated on coumadin -- Slowly drifting down  Multiple medical problems -- Stable, on home meds except coumadin  FEN -- No issues, encouraged oral pain meds (he's not taking much) VTE -- SCD's, Cont to hold lovenox due to high CT volume, maybe resume tomorrow? Dispo -- Continue SDU today to continue aggressive pulmonary toilet. Will need SNF placement at his facility when ready for d/c    Excell Seltzer Pager: [place] PA Pager: [phone]   08/16/2013

## 2013-08-16 NOTE — Progress Notes (Signed)
Patient ID: Evan Perez, male   DOB: 01-09-1923, 80 y.o.   MRN: [id number] Subjective:  The patient is alert and pleasant. He is in no apparent distress.  Objective: Vital signs in last 24 hours: Temp:  [97.5 F (36.4 C)-98.5 F (36.9 C)] 97.7 F (36.5 C) (03/15 0700) Pulse Rate:  [75-84] 75 (03/15 0441) Resp:  [15-19] 19 (03/15 0441) BP: (139-172)/(65-78) 139/68 mmHg (03/15 0441) SpO2:  [91 %-100 %] 91 % (03/15 0441) Weight:  [74.8 kg (164 lb 14.5 oz)] 74.8 kg (164 lb 14.5 oz) (03/15 0441)  Intake/Output from previous day: 03/14 0701 - 03/15 0700 In: 1200 [P.O.:1200] Out: 2525 [Urine:875; Chest Tube:1650] Intake/Output this shift:    Physical exam the patient is alert and oriented. He is moving all 4 extremities well.  Lab Results:  Recent Labs  08/14/13 0334 08/15/13 0320  WBC 8.8 7.5  HGB 6.6* 7.7*  HCT 20.1* 23.1*  PLT 129* 128*   BMET  Recent Labs  08/14/13 0334 08/15/13 0320  NA 137 132*  K 4.0 3.7  CL 96 92*  CO2 28 29  GLUCOSE 105* 107*  BUN 99* 87*  CREATININE 1.85* 1.82*  CALCIUM 7.9* 7.9*    Studies/Results: Dg Chest Port 1 View  08/15/2013   CLINICAL DATA:  The pneumothorax  EXAM: PORTABLE CHEST - 1 VIEW  COMPARISON:  Prior chest x-ray 08/14/2013  FINDINGS: Trace right apical pneumothorax without significant interval change. The right-sided chest tube remains in unchanged position. Persistent right middle lobe atelectasis. Small left pleural effusion with associated atelectasis. Stable cardiomegaly. Atherosclerotic calcifications in the transverse aorta. No acute osseous abnormality.  IMPRESSION: 1. Stable trace right apical pneumothorax with chest tube in place. 2. Persistent right middle lobe atelectasis. 3. Unchanged left pleural effusion and basilar atelectasis.   Electronically Signed   By: Jacqulynn Cadet M.D.   On: 08/15/2013 07:55    Assessment/Plan: Lumbar fracture: The patient is doing well in an orthosis.  LOS: 4 days      Nicanor Mendolia D 08/16/2013, 9:43 AM

## 2013-08-16 NOTE — Progress Notes (Signed)
Still with significant chest tube output - continue chest tube  Imogene Burn. Georgette Dover, MD, [facility] Surgery  General/ Trauma Surgery  08/16/2013 10:51 AM

## 2013-08-17 ENCOUNTER — Inpatient Hospital Stay (HOSPITAL_COMMUNITY): Payer: Medicare Other

## 2013-08-17 LAB — CBC
HCT: 26.4 % — ABNORMAL LOW (ref 39.0–52.0)
HEMOGLOBIN: 8.6 g/dL — AB (ref 13.0–17.0)
MCH: 30 pg (ref 26.0–34.0)
MCHC: 32.6 g/dL (ref 30.0–36.0)
MCV: 92 fL (ref 78.0–100.0)
PLATELETS: 136 10*3/uL — AB (ref 150–400)
RBC: 2.87 MIL/uL — AB (ref 4.22–5.81)
RDW: 18.9 % — ABNORMAL HIGH (ref 11.5–15.5)
WBC: 10.3 10*3/uL (ref 4.0–10.5)

## 2013-08-17 LAB — BASIC METABOLIC PANEL
BUN: 51 mg/dL — ABNORMAL HIGH (ref 6–23)
CHLORIDE: 91 meq/L — AB (ref 96–112)
CO2: 31 meq/L (ref 19–32)
Calcium: 8.5 mg/dL (ref 8.4–10.5)
Creatinine, Ser: 1.54 mg/dL — ABNORMAL HIGH (ref 0.50–1.35)
GFR calc Af Amer: 45 mL/min — ABNORMAL LOW (ref >90)
GFR calc non Af Amer: 39 mL/min — ABNORMAL LOW (ref >90)
GLUCOSE: 88 mg/dL (ref 70–99)
Potassium: 4.2 mEq/L (ref 3.7–5.3)
SODIUM: 131 meq/L — AB (ref 137–147)

## 2013-08-17 LAB — BODY FLUID CULTURE
Culture: NO GROWTH
SPECIAL REQUESTS: NORMAL

## 2013-08-17 LAB — PATHOLOGIST SMEAR REVIEW: Path Review: REACTIVE

## 2013-08-17 MED ORDER — FUROSEMIDE 40 MG PO TABS
40.0000 mg | ORAL_TABLET | Freq: Two times a day (BID) | ORAL | Status: DC
  Administered 2013-08-17 – 2013-08-19 (×5): 40 mg via ORAL
  Filled 2013-08-17 (×8): qty 1

## 2013-08-17 MED ORDER — BISACODYL 5 MG PO TBEC
10.0000 mg | DELAYED_RELEASE_TABLET | Freq: Every day | ORAL | Status: DC
  Administered 2013-08-17 – 2013-08-20 (×4): 10 mg via ORAL
  Filled 2013-08-17 (×4): qty 2

## 2013-08-17 MED ORDER — DILTIAZEM HCL ER COATED BEADS 120 MG PO CP24
120.0000 mg | ORAL_CAPSULE | Freq: Every day | ORAL | Status: DC
  Administered 2013-08-17 – 2013-08-20 (×4): 120 mg via ORAL
  Filled 2013-08-17 (×4): qty 1

## 2013-08-17 NOTE — Progress Notes (Signed)
Patient ID: Evan Perez, male   DOB: 22-Feb-1923, 80 y.o.   MRN: [id number]   LOS: 5 days   Subjective: No new c/o.   Objective: Vital signs in last 24 hours: Temp:  [97.9 F (36.6 C)-98.5 F (36.9 C)] 98.5 F (36.9 C) (03/16 0800) Pulse Rate:  [72-105] 84 (03/16 0800) Resp:  [12-19] 19 (03/16 0800) BP: (128-185)/(51-75) 154/69 mmHg (03/16 0800) SpO2:  [95 %-100 %] 98 % (03/16 0800) Weight:  [165 lb 2 oz (74.9 kg)] 165 lb 2 oz (74.9 kg) (03/16 0325) Last BM Date:  (prior to admission)   IS: 532ml   CT No air leak No OP   Laboratory  CBC  Recent Labs  08/16/13 1145 08/17/13 0853  WBC 10.3 10.3  HGB 8.4* 8.6*  HCT 26.2* 26.4*  PLT 139* 136*   BMET  Recent Labs  08/15/13 0320 08/17/13 0853  NA 132* 131*  K 3.7 4.2  CL 92* 91*  CO2 29 31  GLUCOSE 107* 88  BUN 87* 51*  CREATININE 1.82* 1.54*  CALCIUM 7.9* 8.5    Radiology Results PORTABLE CHEST - 1 VIEW  COMPARISON: 08/16/2013  FINDINGS:  Cardiac shadow is again enlarged. A right-sided thoracostomy  catheter is again identified. A minimal right pneumothorax is noted  which has actually improved in the interval from the prior exam.  Diffuse a density is noted on the right. This likely represents some  fluid trapped within the major fissure. A small left-sided pleural  effusion is again noted. No other new focal abnormality is seen.  IMPRESSION:  Tiny right apical pneumothorax.  Increased density in the mid right lung likely representing some  fluid in the major fissure.  Electronically Signed  By: Inez Catalina M.D.  On: 08/17/2013 07:21   Physical Exam General appearance: alert and no distress Resp: clear to auscultation bilaterally Cardio: regular rate and rhythm GI: normal findings: bowel sounds normal and soft, non-tender   Assessment/Plan: Fall  Multiple right rib fxs w/HTX s/p CT -- D/C CT, watch for return of pleural effusion L2-4 compression fxs -- Lumbar corset  Afib w/RVR and  elevated troponins -- Appreciate cardiology consult Acute on chronic anemia -- Stable Multiple medical problems -- Stable, on home meds except coumadin which is not to be continued due to fall risk per cardiology FEN -- No issues  VTE -- SCD's Dispo -- Continue SDU today     Lisette Abu, PA-C Pager: [phone] General Trauma PA Pager: [phone]  08/17/2013

## 2013-08-17 NOTE — Progress Notes (Signed)
Physical Therapy Treatment Patient Details Name: Evan Perez MRN: [id number] DOB: 21-Oct-1922 Today's Date: 08/17/2013 Time: 1535-1610 PT Time Calculation (min): 35 min  PT Assessment / Plan / Recommendation  History of Present Illness 80 yo male who fell yesterday and was found down on his floor. He was brought to [facility] ED where he was evaluated and found to have multiple mild compression fractures in the lumbar region.   PT Comments   Pt was adm with the above dx. Pt's functional mobility has improved and pt has been making good progress towards functional goals. Pt's functional mobility continues to be limited by pain. Pt able to amb 150 ft with min A. Pt was able to recall back precautions and was given a handout to take home. Pt continues to benefit from SNF to improve functional mobility to mod I level for safe return home alone.   Follow Up Recommendations  SNF     Does the patient have the potential to tolerate intense rehabilitation     Barriers to Discharge        Equipment Recommendations  3in1 (PT)    Recommendations for Other Services    Frequency Min 3X/week   Progress towards PT Goals Progress towards PT goals: Progressing toward goals  Plan Current plan remains appropriate    Precautions / Restrictions Precautions Precautions: Back;Fall Precaution Booklet Issued: Yes (comment) Precaution Comments: pt's back brace was positioned incorrectly. PT readjusted it to proper position Required Braces or Orthoses: Spinal Brace Spinal Brace: Lumbar corset Restrictions Weight Bearing Restrictions: No   Pertinent Vitals/Pain Pt reports pain in chest and LEs.    Mobility  Transfers Overall transfer level: Needs assistance Equipment used: 1 person hand held assist;Rolling walker (2 wheeled) Transfers: Sit to/from Stand Sit to Stand: Mod assist General transfer comment: pt able to initiate sit to stand but req mod A due to weakness and pain. verbal cue's to remind pt of  back precautions throughout transfer.  Ambulation/Gait Ambulation/Gait assistance: Min assist Ambulation Distance (Feet): 150 Feet Assistive device: Rolling walker (2 wheeled) Gait Pattern/deviations: Step-through pattern;Decreased stride length Gait velocity: slow; guarded for falls General Gait Details: pt req 3 standing rest breaks due to fatigue. pt req RW for support and to amb    Exercises     PT Diagnosis:    PT Problem List:   PT Treatment Interventions:     PT Goals (current goals can now be found in the care plan section) Acute Rehab PT Goals Patient Stated Goal: return home PT Goal Formulation: With patient Time For Goal Achievement: 08/20/13 Potential to Achieve Goals: Good  Visit Information  Last PT Received On: 08/17/13 Assistance Needed: +1 History of Present Illness: 80 yo male who fell yesterday and was found down on his floor. He was brought to [facility] ED where he was evaluated and found to have multiple mild compression fractures in the lumbar region.    Subjective Data  Patient Stated Goal: return home   Cognition  Cognition Arousal/Alertness: Awake/alert Behavior During Therapy: WFL for tasks assessed/performed Overall Cognitive Status: Within Functional Limits for tasks assessed    Balance  Balance Overall balance assessment: Needs assistance Sitting-balance support: Bilateral upper extremity supported Sitting balance-Leahy Scale: Poor Sitting balance - Comments: pt req UE for sitting stabilization. pt's demonstrated weakness in trunk control Standing balance support: Bilateral upper extremity supported Standing balance-Leahy Scale: Poor Standing balance comment: pt req RW for support to maintain standing position.  General Comments General comments (skin integrity,  edema, etc.): pt's LEs continue to have blister sores which are painful to touch  End of Session PT - End of Session Equipment Utilized During Treatment: Gait belt;Back brace;Oxygen (3L  02. RW) Activity Tolerance: Patient limited by fatigue Patient left: in chair;with call bell/phone within reach;with family/visitor present Nurse Communication: Mobility status   GP     Kohler,Andrew SPT 08/17/2013, 4:54 PM   Agree with above assessment.  Kittie Plater, PT, DPT Pager #: [phone] Office #: [phone]

## 2013-08-17 NOTE — Progress Notes (Signed)
Last remaining chest tube removed per MD order. Pt tolerated procedure well. Dressing applied. No s/s of bleeding noted.

## 2013-08-17 NOTE — Progress Notes (Signed)
Patient: Evan Perez / Admit Date: 08/12/2013 / Date of Encounter: 08/17/2013, 9:46 AM  Subjective  No CP or SOB. Feels all right today except for left heel wound - followed in wound care center at [facility] for BLE stasis ulcers, [place] saw and redressed today and plans for continued OP followup.  Objective   Telemetry: chronic AF rate controlled  Physical Exam: Blood pressure 154/69, pulse 84, temperature 98.5 F (36.9 C), temperature source Oral, resp. rate 19, height 5\' 10"  (1.778 m), weight 165 lb 2 oz (74.9 kg), SpO2 98.00%. General: Well developed thin WM in no acute distress.  Head: Normocephalic, atraumatic, sclera non-icteric, no xanthomas, nares are without discharge.  Neck: JVD not elevated.  Lungs: Clear bilaterally to auscultation without wheezes, rales, or rhonchi. Breathing is unlabored.  Heart: Irregular, borderline tachy with S1 S2. Crisp valve sound. 2/6 SEM LSB. No significant rubs or gallops appreciated.  Abdomen: Soft, non-tender, non-distended with normoactive bowel sounds. No hepatomegaly. No rebound/guarding. No obvious abdominal masses.  Msk: Generalized muscular atrophy noted.  Extremities: No clubbing or cyanosis. LLE wrapped. Unchanged chronic erythematous type changes without significant edema. No open wounds RLE. Neuro: Alert and oriented X 3. No facial asymmetry. No focal deficit. Moves all extremities spontaneously.  Psych: Responds to questions appropriately with a normal affect.   Intake/Output Summary (Last 24 hours) at 08/17/13 0946 Last data filed at 08/17/13 0800  Gross per 24 hour  Intake      0 ml  Output   1600 ml  Net  -1600 ml    Inpatient Medications:  . cloNIDine  0.2 mg Oral BID  . diltiazem  30 mg Oral 4 times per day  . docusate sodium  100 mg Oral BID  . levothyroxine  25 mcg Oral QAC breakfast  . multivitamin with minerals  1 tablet Oral Daily  . mupirocin ointment  1 application Nasal BID  . polyethylene glycol  17 g Oral Daily     Infusions:    Labs:  Recent Labs  08/15/13 0320 08/17/13 0853  NA 132* 131*  K 3.7 4.2  CL 92* 91*  CO2 29 31  GLUCOSE 107* 88  BUN 87* 51*  CREATININE 1.82* 1.54*  CALCIUM 7.9* 8.5   No results found for this basename: AST, ALT, ALKPHOS, BILITOT, PROT, ALBUMIN,  in the last 72 hours  Recent Labs  08/16/13 1145 08/17/13 0853  WBC 10.3 10.3  HGB 8.4* 8.6*  HCT 26.2* 26.4*  MCV 91.3 92.0  PLT 139* 136*   No results found for this basename: CKTOTAL, CKMB, TROPONINI,  in the last 72 hours No components found with this basename: POCBNP,  No results found for this basename: HGBA1C,  in the last 72 hours   Radiology/Studies:  Ct Abdomen Pelvis Wo Contrast  08/12/2013   CLINICAL DATA Fall.  Trauma.  Lower back pain.  History of L1 fracture.  EXAM CT CHEST, ABDOMEN AND PELVIS WITHOUT CONTRAST  TECHNIQUE Multidetector CT imaging of the chest, abdomen and pelvis was performed following the standard protocol without IV contrast.  COMPARISON 03/09/2013 abdominal CT.  FINDINGS CT CHEST FINDINGS  THORACIC INLET/BODY WALL:  No acute abnormality.  MEDIASTINUM:  Cardiomegaly. No pericardial effusion. Previous aortic valvular replacement. Extensive coronary artery atherosclerosis. No evidence of acute aortic injury. No suspicious adenopathy.  LUNG WINDOWS:  There is airspace disease in the right lower lobe. Small to moderate right pleural effusion which is layering water density. Trace left pleural effusion. There is  a tiny pneumothorax in the ventral and lower right chest.  OSSEOUS:  See below  CT ABDOMEN AND PELVIS FINDINGS  Liver: No evidence of injury.  Biliary: No evidence of biliary obstruction or stone.  Pancreas: Unremarkable.  Spleen: Minimal fluid around the spleen, which is low-density.  Adrenals: Unremarkable.  Kidneys and ureters: No evidence of injury. Bilateral atrophy. No hydronephrosis.  Bladder: Unremarkable.  Reproductive: Unremarkable.  Bowel: No wall thickening.  No  obstruction  Retroperitoneum: No hematoma.  Peritoneum: Small volume free fluid around the spleen and in the pelvis. The fluid is water density. Pelvic fluid noted on previous examination.  Vascular: Status post aorto bi-iliac stent graft. Coil embolization of the left hypogastric artery. Unchanged or smaller size of the left iliac artery aneurysm sac, 4 cm in diameter. Unchanged size of the aortic aneurysm sac, approximately 4 cm in maximal diameter. 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  OSSEOUS: Acute fractures of the right fourth, fifth, sixth, seventh, eighth, ninth ribs. There is up to 100% displacement inferiorly.  Multiple spinal fractures:  L5: Horizontal low-density cleft through the upper body. Compression is maximal posteriorly, approximately 25%. No subluxation.  L4: Inferior endplate fracture which is new from prior and likely acute.  L3: Superior endplate compression fracture with height loss less than 50%. There is minimal bony retropulsion, without significant canal narrowing.  L1: Remote compression fracture. Exaggerated kyphosis at this level.  T12: Remote appearing Schmorl's node in the superior endplate with surrounding sclerosis.  T3: Horizontal fracture through the upper body with mild compression. This fracture appears acute.  There is no evidence of spinal subluxation or acute posterior element fractures.  Critical Value/emergent results were called by telephone at the time of interpretation on 08/12/2013 at 2:36 PM to Dr. Shirlyn Goltz , who verbally acknowledged these results.  IMPRESSION 1. Acute right fourth through ninth rib fractures. 2. Acute compression type vertebral fractures of L5, L4, L3, T12, and T3. No spinal subluxation or significant bony retropulsion. 3. Trace right pneumothorax. 4. Moderate right pleural effusion which is primarily non hemorrhagic. There is a combination of atelectasis and pneumonia/aspiration in the right lower  lobe. 5. Small volume fluid around the spleen without visible parenchymal abnormality; cannot exclude splenic injury without contrast. 6. Small volume pelvic ascites, chronic from October 2014. 7. Non-traumatic findings noted above.  SIGNATURE  Electronically Signed   By: Jorje Guild M.D.   On: 08/12/2013 14:36   Dg Chest 1 View  08/12/2013   CLINICAL DATA Fall  EXAM CHEST - 1 VIEW  COMPARISON 01/29/2013  FINDINGS Cardiac enlargement with vascular congestion and mild fluid overload. Small right pleural effusion has developed since the prior study. Mild bibasilar atelectasis. Underlying COPD.  Fractures of the right sixth and eighth and ninth ribs.  IMPRESSION Right rib fractures. Small right effusion may be due to heart failure or hemothorax.  Cardiac enlargement with mild fluid overload.  SIGNATURE  Electronically Signed   By: Franchot Gallo M.D.   On: 08/12/2013 12:47   Dg Lumbar Spine Complete  08/12/2013   CLINICAL DATA Fall  EXAM LUMBAR SPINE - COMPLETE 4+ VIEW  COMPARISON 03/09/2013  FINDINGS Aortoiliac stent graft in satisfactory position. Coils in the left hypogastric artery.  Chronic compression fractures of T12 and L1 are unchanged. There are new endplate deformities at L3, L4, and L5 consistent with recent fractures. MRI may be helpful for further evaluation of the spinal canal  and to date these fractures.  IMPRESSION Mild fractures at L3, L4, and L5 not present on 03/09/2013 and possibly acute. Chronic fractures T12 and L1.  SIGNATURE  Electronically Signed   By: Franchot Gallo M.D.   On: 08/12/2013 12:50   Dg Pelvis 1-2 Views  08/12/2013   CLINICAL DATA Fall  EXAM PELVIS - 1-2 VIEW  COMPARISON None.  FINDINGS Negative for fracture in the pelvis. Both hips show mild degenerative change. Aortoiliac stent graft. Coils in the left hypogastric artery.  IMPRESSION Negative for fracture.  SIGNATURE  Electronically Signed   By: Franchot Gallo M.D.   On: 08/12/2013 12:47   Ct Head Wo  Contrast  08/12/2013   CLINICAL DATA Patient on ground for 4 hr. Back pain. Patient fell this morning at 6 o'clock.  EXAM CT HEAD WITHOUT CONTRAST  CT CERVICAL SPINE WITHOUT CONTRAST  TECHNIQUE Multidetector CT imaging of the head and cervical spine was performed following the standard protocol without intravenous contrast. Multiplanar CT image reconstructions of the cervical spine were also generated.  COMPARISON CT HEAD W/O CM dated 06/11/2013  FINDINGS CT HEAD FINDINGS  There is no evidence of mass effect, midline shift, or extra-axial fluid collections. There is no evidence of a space-occupying lesion or intracranial hemorrhage. There is no evidence of a cortical-based area of acute infarction. There is generalized cerebral atrophy. There is periventricular white matter low attenuation likely secondary to microangiopathy.  The ventricles and sulci are appropriate for the patient's age. The basal cisterns are patent.  Visualized portions of the orbits are unremarkable. The visualized portions of the paranasal sinuses and mastoid air cells are unremarkable. Cerebrovascular atherosclerotic calcifications are noted.  The osseous structures are unremarkable.  CT CERVICAL SPINE FINDINGS  The alignment is anatomic. The vertebral body heights are maintained. There is no acute fracture. There is no static listhesis. The prevertebral soft tissues are normal. The intraspinal soft tissues are not fully imaged on this examination due to poor soft tissue contrast, but there is no gross soft tissue abnormality.  There is degenerative disc disease at C4-5, C5-6 and C6-7. There broad-based disc osteophyte complexes that C3-4, C4-5, C5-6 and C6-7. There is bilateral uncovertebral degenerative changes C5-6 resulting in foraminal encroachment.  The visualized portions of the lung apices demonstrate no focal abnormality.  IMPRESSION 1. No acute intracranial pathology. 2. No acute osseous injury cervical spine. 3. Cervical spine  spondylosis as described above. Evaluation the cervical spine is somewhat limited as the C7 and T1 levels are excluded from the field of view.  SIGNATURE  Electronically Signed   By: Kathreen Devoid   On: 08/12/2013 13:46   Ct Chest Wo Contrast  08/12/2013   CLINICAL DATA Fall.  Trauma.  Lower back pain.  History of L1 fracture.  EXAM CT CHEST, ABDOMEN AND PELVIS WITHOUT CONTRAST  TECHNIQUE Multidetector CT imaging of the chest, abdomen and pelvis was performed following the standard protocol without IV contrast.  COMPARISON 03/09/2013 abdominal CT.  FINDINGS CT CHEST FINDINGS  THORACIC INLET/BODY WALL:  No acute abnormality.  MEDIASTINUM:  Cardiomegaly. No pericardial effusion. Previous aortic valvular replacement. Extensive coronary artery atherosclerosis. No evidence of acute aortic injury. No suspicious adenopathy.  LUNG WINDOWS:  There is airspace disease in the right lower lobe. Small to moderate right pleural effusion which is layering water density. Trace left pleural effusion. There is a tiny pneumothorax in the ventral and lower right chest.  OSSEOUS:  See below  CT ABDOMEN AND PELVIS FINDINGS  Liver: No evidence of injury.  Biliary: No evidence of biliary obstruction or stone.  Pancreas: Unremarkable.  Spleen: Minimal fluid around the spleen, which is low-density.  Adrenals: Unremarkable.  Kidneys and ureters: No evidence of injury. Bilateral atrophy. No hydronephrosis.  Bladder: Unremarkable.  Reproductive: Unremarkable.  Bowel: No wall thickening.  No obstruction  Retroperitoneum: No hematoma.  Peritoneum: Small volume free fluid around the spleen and in the pelvis. The fluid is water density. Pelvic fluid noted on previous examination.  Vascular: Status post aorto bi-iliac stent graft. Coil embolization of the left hypogastric artery. Unchanged or smaller size of the left iliac artery aneurysm sac, 4 cm in diameter. Unchanged size of the aortic aneurysm sac, approximately 4 cm in maximal diameter. Non  protected aneurysm at the level of the renal arteries is similar in size at 4 cm.  OSSEOUS: Acute fractures of the right fourth, fifth, sixth, seventh, eighth, ninth ribs. There is up to 100% displacement inferiorly.  Multiple spinal fractures:  L5: Horizontal low-density cleft through the upper body. Compression is maximal posteriorly, approximately 25%. No subluxation.  L4: Inferior endplate fracture which is new from prior and likely acute.  L3: Superior endplate compression fracture with height loss less than 50%. There is minimal bony retropulsion, without significant canal narrowing.  L1: Remote compression fracture. Exaggerated kyphosis at this level.  T12: Remote appearing Schmorl's node in the superior endplate with surrounding sclerosis.  T3: Horizontal fracture through the upper body with mild compression. This fracture appears acute.  There is no evidence of spinal subluxation or acute posterior element fractures.  Critical Value/emergent results were called by telephone at the time of interpretation on 08/12/2013 at 2:36 PM to Dr. Shirlyn Goltz , who verbally acknowledged these results.  IMPRESSION 1. Acute right fourth through ninth rib fractures. 2. Acute compression type vertebral fractures of L5, L4, L3, T12, and T3. No spinal subluxation or significant bony retropulsion. 3. Trace right pneumothorax. 4. Moderate right pleural effusion which is primarily non hemorrhagic. There is a combination of atelectasis and pneumonia/aspiration in the right lower lobe. 5. Small volume fluid around the spleen without visible parenchymal abnormality; cannot exclude splenic injury without contrast. 6. Small volume pelvic ascites, chronic from October 2014. 7. Non-traumatic findings noted above.  SIGNATURE  Electronically Signed   By: Jorje Guild M.D.   On: 08/12/2013 14:36   Ct Cervical Spine Wo Contrast  08/12/2013   CLINICAL DATA Patient on ground for 4 hr. Back pain. Patient fell this morning at 6 o'clock.   EXAM CT HEAD WITHOUT CONTRAST  CT CERVICAL SPINE WITHOUT CONTRAST  TECHNIQUE Multidetector CT imaging of the head and cervical spine was performed following the standard protocol without intravenous contrast. Multiplanar CT image reconstructions of the cervical spine were also generated.  COMPARISON CT HEAD W/O CM dated 06/11/2013  FINDINGS CT HEAD FINDINGS  There is no evidence of mass effect, midline shift, or extra-axial fluid collections. There is no evidence of a space-occupying lesion or intracranial hemorrhage. There is no evidence of a cortical-based area of acute infarction. There is generalized cerebral atrophy. There is periventricular white matter low attenuation likely secondary to microangiopathy.  The ventricles and sulci are appropriate for the patient's age. The basal cisterns are patent.  Visualized portions of the orbits are unremarkable. The visualized portions of the paranasal sinuses and mastoid air cells are unremarkable. Cerebrovascular atherosclerotic calcifications are noted.  The osseous structures are unremarkable.  CT CERVICAL SPINE FINDINGS  The alignment is anatomic.  The vertebral body heights are maintained. There is no acute fracture. There is no static listhesis. The prevertebral soft tissues are normal. The intraspinal soft tissues are not fully imaged on this examination due to poor soft tissue contrast, but there is no gross soft tissue abnormality.  There is degenerative disc disease at C4-5, C5-6 and C6-7. There broad-based disc osteophyte complexes that C3-4, C4-5, C5-6 and C6-7. There is bilateral uncovertebral degenerative changes C5-6 resulting in foraminal encroachment.  The visualized portions of the lung apices demonstrate no focal abnormality.  IMPRESSION 1. No acute intracranial pathology. 2. No acute osseous injury cervical spine. 3. Cervical spine spondylosis as described above. Evaluation the cervical spine is somewhat limited as the C7 and T1 levels are excluded from  the field of view.  SIGNATURE  Electronically Signed   By: Kathreen Devoid   On: 08/12/2013 13:46   Dg Chest Port 1 View  08/17/2013   CLINICAL DATA:  Hemothorax  EXAM: PORTABLE CHEST - 1 VIEW  COMPARISON:  08/16/2013  FINDINGS: Cardiac shadow is again enlarged. A right-sided thoracostomy catheter is again identified. A minimal right pneumothorax is noted which has actually improved in the interval from the prior exam. Diffuse a density is noted on the right. This likely represents some fluid trapped within the major fissure. A small left-sided pleural effusion is again noted. No other new focal abnormality is seen.  IMPRESSION: Tiny right apical pneumothorax.  Increased density in the mid right lung likely representing some fluid in the major fissure.   Electronically Signed   By: Inez Catalina M.D.   On: 08/17/2013 07:21   Dg Chest Port 1 View  08/16/2013   CLINICAL DATA:  Right hemothorax due to multiple rib fractures.  EXAM: PORTABLE CHEST - 1 VIEW  COMPARISON:  Chest x-ray dated 08/15/2013  FINDINGS: Right chest tube remains in place. There is a tiny right apical pneumothorax. There has been re-accumulation of a small amount of fluid at the right lung base. Left effusion is unchanged.  Pulmonary vascularity is normal.  IMPRESSION: Reaccumulating fluid at the right lung base. Tiny right apical pneumothorax.  Stable small left effusion.   Electronically Signed   By: Rozetta Nunnery M.D.   On: 08/16/2013 20:31   Dg Chest Port 1 View  08/15/2013   CLINICAL DATA:  The pneumothorax  EXAM: PORTABLE CHEST - 1 VIEW  COMPARISON:  Prior chest x-ray 08/14/2013  FINDINGS: Trace right apical pneumothorax without significant interval change. The right-sided chest tube remains in unchanged position. Persistent right middle lobe atelectasis. Small left pleural effusion with associated atelectasis. Stable cardiomegaly. Atherosclerotic calcifications in the transverse aorta. No acute osseous abnormality.  IMPRESSION: 1. Stable  trace right apical pneumothorax with chest tube in place. 2. Persistent right middle lobe atelectasis. 3. Unchanged left pleural effusion and basilar atelectasis.   Electronically Signed   By: Jacqulynn Cadet M.D.   On: 08/15/2013 07:55   Dg Chest Port 1 View  08/14/2013   CLINICAL DATA:  Right rib fractures, pneumothorax  EXAM: PORTABLE CHEST - 1 VIEW  COMPARISON:  Portable exam 4259 hr compared to 08/13/2013  FINDINGS: Right thoracostomy tube stable.  Enlargement of cardiac silhouette post MVR.  Atherosclerotic calcification aorta.  Mediastinal contours and pulmonary vascularity otherwise normal.  Tiny residual right apex pneumothorax, decreased.  Subsegmental atelectasis right base.  Atelectasis versus consolidation left lower lobe.  Probable underlying COPD with improved pulmonary edema.  No pneumothorax.  Bones demineralized.  IMPRESSION: Tiny residual right  pneumothorax post thoracostomy tube.  Linear subsegmental atelectasis right lung base with atelectasis versus consolidation in left lower lobe.  Improved pulmonary edema.   Electronically Signed   By: Lavonia Dana M.D.   On: 08/14/2013 08:09   Dg Chest Port 1 View  08/13/2013   CLINICAL DATA Pneumothorax.  EXAM PORTABLE CHEST - 1 VIEW  COMPARISON DG CHEST 1V PORT dated 08/12/2013; CT CHEST W/O CM dated 08/12/2013  FINDINGS Right chest tube in stable position. Stable tiny right apical pneumothorax noted. Atelectatic changes in the lung bases. Developing infiltrate and/or atelectasis right mid lung field. Prior CABG. Severe cardiomegaly. Mild interstitial prominence noted. These findings suggest congestive heart failure. Multiple right rib fractures are present. These are better demonstrated on recent CT.  IMPRESSION 1. Right chest tube in stable position. Tiny stable right apical pneumothorax. 2. Congestive heart failure with mild interstitial edema. 3. Bibasilar atelectasis. Developing atelectasis and/or infiltrate in the right mid lung noted. 4. Right  rib fractures, better demonstrated by recent CT.  SIGNATURE  Electronically Signed   By: Marcello Moores  Register   On: 08/13/2013 07:56   Dg Chest Port 1 View  08/12/2013   CLINICAL DATA Chest tube placement  EXAM PORTABLE CHEST - 1 VIEW  COMPARISON 08/12/2013  FINDINGS Right chest tube placed in good position with decrease in right pleural effusion. Minimal right apical pneumothorax. Improved aeration in the right lung base  Cardiac enlargement. Improvement in pulmonary vascular congestion. Left lower lobe atelectasis unchanged.  IMPRESSION Right chest tube placement with decrease in right pleural effusion. Minimal right apical pneumothorax  Improvement in congestive heart failure.  Bibasilar atelectasis with improved aeration in the right lung base.  SIGNATURE  Electronically Signed   By: Franchot Gallo M.D.   On: 08/12/2013 15:52     Assessment and Plan  1. Mechanical fall with recent frequent falls, and subsequent multiple rib fractures and vertebral compression fractures  2. R hemothorax s/p chest tube with bloody drainage, felt exudative due to trauma 3. Acute on chronic anemia likely due to above 4. Chronic atrial fibrillation with elevated heart rate on admission, deemed no longer a Coumadin candidate this admission 5. Mildly elevated POC troponin with mild nonobstructive CAD by cath 2011, suspected due to demand ischemia, no planned further workup, not on statin due to h/o intolerance 6. 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 Chronic diastolic CHF due to hypertensive heart disease, primarily RHF, suspect some component of volume retention due to hypoalbuminemia  8. HTN  9. PVD s/p AAA/iliac aneurysm repair 01/2013   Consolidate diltiazem to once daily dosing (was not on AVN blocking agent PTA). No longer Coumadin candidate due to significant fall this admission along with other falls this year. Resume ASA when OK with primary team given his abdominal aortic  aneurysm/iliac repair history. Consider resuming Lasix at half of home dose today.  Signed, Melina Copa PA-C Agree with above assessment and plan.  We will restart a low dose of furosemide today.  We will want to restart aspirin when okay with primary team.

## 2013-08-17 NOTE — Progress Notes (Signed)
Chest tube is out, patient is fine.  CXR for tomorrow AM.  This patient has been seen and I agree with the findings and treatment plan.  Kathryne Eriksson. Dahlia Bailiff, MD, [place] [phone] (pager) [phone] (direct pager) Trauma Surgeon

## 2013-08-17 NOTE — Consult Note (Signed)
WOC follow-up: Pt followed by the outpatient wound care center for BLE stasis ulcers.  Wears compression wraps which are changed Q week.  Removed to assess legs.  Right leg without any open wounds or drainage.  No further need for compression wraps at this time.  Left upper leg with unchanged from previous assessment.  Applied foam dressing to upper leg wound which is beefy red with minimal yellow drainage, no further green drainage or odor that was present upon initial assessment last week.  Left heel with previous deep tissue injury has evolved into [id number].1xm stage 2 wound, 100% pink and moist with small amt pink drainage.  Pt c/o large amt discomfort and pressure to the heel wound from [employer].  Discontinued this therapy and applied foam dressing and ace wrap for light compression.  Left heel has remained floated by bedside nurses, pt states.  He can return to the outpatient wound care center after discharge for follow-up. Please re-consult if further assistance is needed.  Thank-you,  Julien Girt MSN, [place], [place], [place], [place]

## 2013-08-17 NOTE — Progress Notes (Signed)
Physical Therapy Treatment Patient Details Name: Evan Perez MRN: [id number] DOB: Nov 21, 1922 Today's Date: 08/17/2013 Time: 1535-1610 PT Time Calculation (min): 35 min  PT Assessment / Plan / Recommendation  History of Present Illness 80 yo male who fell yesterday and was found down on his floor. He was brought to [facility] ED where he was evaluated and found to have multiple mild compression fractures in the lumbar region.   PT Comments   Pt was adm with the above dx. Pt's functional mobility has improved and pt has been making good progress towards functional goals. Pt's functional mobility continues to be limited by pain. Pt able to amb 150 ft with min A. Pt was able to recall back precautions and was given a handout to take home. Pt continues to benefit from SNF to improve functional mobility to mod I level for safe return home alone.   Follow Up Recommendations  SNF     Does the patient have the potential to tolerate intense rehabilitation     Barriers to Discharge        Equipment Recommendations  3in1 (PT)    Recommendations for Other Services    Frequency Min 3X/week   Progress towards PT Goals Progress towards PT goals: Progressing toward goals  Plan Current plan remains appropriate    Precautions / Restrictions Precautions Precautions: Back;Fall Precaution Booklet Issued: Yes (comment) Precaution Comments: pt's back brace was positioned incorrectly. PT readjusted it to proper position Required Braces or Orthoses: Spinal Brace Spinal Brace: Lumbar corset Restrictions Weight Bearing Restrictions: No   Pertinent Vitals/Pain Pt reports pain in chest and LEs.    Mobility  Transfers Overall transfer level: Needs assistance Equipment used: 1 person hand held assist;Rolling walker (2 wheeled) Transfers: Sit to/from Stand Sit to Stand: Mod assist General transfer comment: pt able to initiate sit to stand but req mod A due to weakness and pain. verbal cue's to remind pt of  back precautions throughout transfer.  Ambulation/Gait Ambulation/Gait assistance: Min assist Ambulation Distance (Feet): 150 Feet Assistive device: Rolling walker (2 wheeled) Gait Pattern/deviations: Step-through pattern;Decreased stride length Gait velocity: slow; guarded for falls General Gait Details: pt req 3 standing rest breaks due to fatigue. pt req RW for support and to amb    Exercises     PT Diagnosis:    PT Problem List:   PT Treatment Interventions:     PT Goals (current goals can now be found in the care plan section) Acute Rehab PT Goals Patient Stated Goal: return home PT Goal Formulation: With patient Time For Goal Achievement: 08/20/13 Potential to Achieve Goals: Good  Visit Information  Last PT Received On: 08/17/13 Assistance Needed: +1 History of Present Illness: 80 yo male who fell yesterday and was found down on his floor. He was brought to [facility] ED where he was evaluated and found to have multiple mild compression fractures in the lumbar region.    Subjective Data  Patient Stated Goal: return home   Cognition  Cognition Arousal/Alertness: Awake/alert Behavior During Therapy: WFL for tasks assessed/performed Overall Cognitive Status: Within Functional Limits for tasks assessed    Balance  Balance Overall balance assessment: Needs assistance Sitting-balance support: Bilateral upper extremity supported Sitting balance-Leahy Scale: Poor Sitting balance - Comments: pt req UE for sitting stabilization. pt's demonstrated weakness in trunk control Standing balance support: Bilateral upper extremity supported Standing balance-Leahy Scale: Poor Standing balance comment: pt req RW for support to maintain standing position.  General Comments General comments (skin integrity,  edema, etc.): pt's LEs continue to have blister sores which are painful to touch  End of Session PT - End of Session Equipment Utilized During Treatment: Gait belt;Back brace;Oxygen (3L  02. RW) Activity Tolerance: Patient limited by fatigue Patient left: in chair;with call bell/phone within reach;with family/visitor present Nurse Communication: Mobility status   GP     Gianah Batt SPT 08/17/2013, 4:54 PM

## 2013-08-17 NOTE — Clinical Social Work Note (Signed)
Clinical Social Worker continuing to follow patient and family for support and discharge planning needs.  CSW spoke with [employer] who is in agreement with patient return to facility in SNF section vs. Return to independent living.  Patient has a bed available no matter what day of discharge per facility.  CSW spoke with patient and patient son at bedside who are agreeable with this plan.  CSW remains available for support and to facilitate patient discharge needs once medically ready.  Barbette Or, [place]

## 2013-08-18 ENCOUNTER — Inpatient Hospital Stay (HOSPITAL_COMMUNITY): Payer: Medicare Other

## 2013-08-18 DIAGNOSIS — S27899A Unspecified injury of other specified intrathoracic organs, initial encounter: Secondary | ICD-10-CM

## 2013-08-18 LAB — BASIC METABOLIC PANEL
BUN: 48 mg/dL — AB (ref 6–23)
CHLORIDE: 92 meq/L — AB (ref 96–112)
CO2: 29 meq/L (ref 19–32)
CREATININE: 1.56 mg/dL — AB (ref 0.50–1.35)
Calcium: 8.2 mg/dL — ABNORMAL LOW (ref 8.4–10.5)
GFR calc Af Amer: 44 mL/min — ABNORMAL LOW (ref >90)
GFR calc non Af Amer: 38 mL/min — ABNORMAL LOW (ref >90)
Glucose, Bld: 93 mg/dL (ref 70–99)
POTASSIUM: 4.2 meq/L (ref 3.7–5.3)
Sodium: 132 mEq/L — ABNORMAL LOW (ref 137–147)

## 2013-08-18 LAB — BODY FLUID CULTURE
Culture: NO GROWTH
GRAM STAIN: NONE SEEN

## 2013-08-18 LAB — CBC
HEMATOCRIT: 25.1 % — AB (ref 39.0–52.0)
HEMOGLOBIN: 8 g/dL — AB (ref 13.0–17.0)
MCH: 29.1 pg (ref 26.0–34.0)
MCHC: 31.9 g/dL (ref 30.0–36.0)
MCV: 91.3 fL (ref 78.0–100.0)
Platelets: 155 10*3/uL (ref 150–400)
RBC: 2.75 MIL/uL — AB (ref 4.22–5.81)
RDW: 18.8 % — ABNORMAL HIGH (ref 11.5–15.5)
WBC: 10.5 10*3/uL (ref 4.0–10.5)

## 2013-08-18 LAB — OCCULT BLOOD X 1 CARD TO LAB, STOOL: Fecal Occult Bld: POSITIVE — AB

## 2013-08-18 MED ORDER — AZELASTINE HCL 0.1 % NA SOLN
1.0000 | Freq: Two times a day (BID) | NASAL | Status: DC | PRN
  Filled 2013-08-18: qty 30

## 2013-08-18 MED ORDER — MAGNESIUM CITRATE PO SOLN
1.0000 | Freq: Once | ORAL | Status: DC
  Filled 2013-08-18: qty 296

## 2013-08-18 MED ORDER — ASPIRIN EC 81 MG PO TBEC
81.0000 mg | DELAYED_RELEASE_TABLET | ORAL | Status: DC
  Administered 2013-08-19: 81 mg via ORAL
  Filled 2013-08-18: qty 1

## 2013-08-18 MED ORDER — ASPIRIN 81 MG PO TABS: 81.0000 mg | ORAL_TABLET | ORAL | Status: DC

## 2013-08-18 MED ORDER — DOCUSATE SODIUM 100 MG PO CAPS
200.0000 mg | ORAL_CAPSULE | Freq: Two times a day (BID) | ORAL | Status: DC
  Administered 2013-08-18 – 2013-08-20 (×5): 200 mg via ORAL
  Filled 2013-08-18 (×4): qty 2

## 2013-08-18 NOTE — Clinical Social Work Note (Signed)
Clinical Social Worker continuing to follow patient and family for support and discharge needs.  CSW spoke with [facility]) to provide current updates.  Facility agreeable to accept patient whenever medically stable.  CSW to facilitate patient discharge needs once medically ready.  Barbette Or, [place]

## 2013-08-18 NOTE — Progress Notes (Signed)
Patient ID: Evan Perez, male   DOB: [birth year], 80 y.o.   MRN: [id number] Afeb, vss No new neuro issues. Headed to SNF soon. I will be happy to follow after d/c.

## 2013-08-18 NOTE — Progress Notes (Signed)
Describes black stool. Will check hemoccult. Transfer to floor. Pulmonary toilet. CXR in AM. Plan SNF at [facility] [place]. Patient examined and I agree with the assessment and plan  Georganna Skeans, MD, MPH, FACS Trauma: [phone] General Surgery: [phone]  08/18/2013 10:45 AM

## 2013-08-18 NOTE — Progress Notes (Signed)
Patient Name: Evan Perez Date of Encounter: 08/18/2013     Principal Problem:   Fall from standing Active Problems:   Chronic atrial fibrillation- rapid AF on admission 08/12/13   Severe AS s/p bovine tissue AVR 2011   Chronic anticoagulation   Hemothorax on right   Chronic diastolic CHF (congestive heart failure)   CKD (chronic kidney disease), stage III   Anemia   Elevated troponin- suspected Type 2 MI   AAA (abdominal aortic aneurysm) s/p endovascular repair 01/2013   Hypertension   Hypertensive heart disease   CAD (coronary artery disease) - nonobstructive by cath 2011   Multiple fractures of ribs of right side   Hemothorax   Pleural effusion    SUBJECTIVE  The patient has just been transferred to [facility] [address]. he feels like he is making progress.  His main complaint is his left heel pain.  He denies chest pain or shortness of breath.  Unable to take deep breaths because of his right rib fractures.  CURRENT MEDS . [START ON 08/19/2013] aspirin EC  81 mg Oral Q M,W,F  . bisacodyl  10 mg Oral Daily  . cloNIDine  0.2 mg Oral BID  . diltiazem  120 mg Oral Daily  . docusate sodium  200 mg Oral BID  . furosemide  40 mg Oral BID  . levothyroxine  25 mcg Oral QAC breakfast  . multivitamin with minerals  1 tablet Oral Daily  . polyethylene glycol  17 g Oral Daily    OBJECTIVE  Filed Vitals:   08/18/13 0505 08/18/13 0700 08/18/13 0811 08/18/13 1100  BP: 158/52  168/68 181/78  Pulse: 112  93 104  Temp: 97.9 F (36.6 C) 98.3 F (36.8 C)  97.1 F (36.2 C)  TempSrc: Oral Oral  Oral  Resp: 20  15 16   Height: 5\' 10"  (1.778 m)     Weight: 162 lb 0.6 oz (73.5 kg)     SpO2: 100%  98% 98%    Intake/Output Summary (Last 24 hours) at 08/18/13 1327 Last data filed at 08/18/13 0700  Gross per 24 hour  Intake    240 ml  Output   1925 ml  Net  -1685 ml   Filed Weights   08/16/13 0441 08/17/13 0325 08/18/13 0505  Weight: 164 lb 14.5 oz (74.8 kg) 165 lb 2 oz (74.9 kg) 162 lb  0.6 oz (73.5 kg)    PHYSICAL EXAM  General: Pleasant, NAD. Neuro: Alert and oriented X 3. Moves all extremities spontaneously. Psych: Normal affect. HEENT:  Normal  Neck: Supple without bruits or JVD. Lungs:  Resp regular and unlabored, CTA. Heart: Irregular heart rate in atrial fibrillation.  Grade 2/6 systolic murmur at the base Abdomen: Soft, non-tender, non-distended, BS + x 4.  Extremities: No clubbing, cyanosis or edema. DP/PT/Radials 2+ and equal bilaterally.  Left leg wrapped  Accessory Clinical Findings  CBC  Recent Labs  08/17/13 0853 08/18/13 0317  WBC 10.3 10.5  HGB 8.6* 8.0*  HCT 26.4* 25.1*  MCV 92.0 91.3  PLT 136* [id number]   Basic Metabolic Panel  Recent Labs  08/17/13 0853 08/18/13 0317  NA 131* 132*  K 4.2 4.2  CL 91* 92*  CO2 31 29  GLUCOSE 88 93  BUN 51* 48*  CREATININE 1.54* 1.56*  CALCIUM 8.5 8.2*   Liver Function Tests No results found for this basename: AST, ALT, ALKPHOS, BILITOT, PROT, ALBUMIN,  in the last 72 hours No results found for this basename: LIPASE,  AMYLASE,  in the last 72 hours Cardiac Enzymes No results found for this basename: CKTOTAL, CKMB, CKMBINDEX, TROPONINI,  in the last 72 hours BNP No components found with this basename: POCBNP,  D-Dimer No results found for this basename: DDIMER,  in the last 72 hours Hemoglobin A1C No results found for this basename: HGBA1C,  in the last 72 hours Fasting Lipid Panel No results found for this basename: CHOL, HDL, LDLCALC, TRIG, CHOLHDL, LDLDIRECT,  in the last 72 hours Thyroid Function Tests No results found for this basename: TSH, T4TOTAL, FREET3, T3FREE, THYROIDAB,  in the last 72 hours  TELE  Atrial fibrillation with controlled ventricular response  ECG    Radiology/Studies  Ct Abdomen Pelvis Wo Contrast  08/12/2013   CLINICAL DATA Fall.  Trauma.  Lower back pain.  History of L1 fracture.  EXAM CT CHEST, ABDOMEN AND PELVIS WITHOUT CONTRAST  TECHNIQUE Multidetector CT  imaging of the chest, abdomen and pelvis was performed following the standard protocol without IV contrast.  COMPARISON 03/09/2013 abdominal CT.  FINDINGS CT CHEST FINDINGS  THORACIC INLET/BODY WALL:  No acute abnormality.  MEDIASTINUM:  Cardiomegaly. No pericardial effusion. Previous aortic valvular replacement. Extensive coronary artery atherosclerosis. No evidence of acute aortic injury. No suspicious adenopathy.  LUNG WINDOWS:  There is airspace disease in the right lower lobe. Small to moderate right pleural effusion which is layering water density. Trace left pleural effusion. There is a tiny pneumothorax in the ventral and lower right chest.  OSSEOUS:  See below  CT ABDOMEN AND PELVIS FINDINGS  Liver: No evidence of injury.  Biliary: No evidence of biliary obstruction or stone.  Pancreas: Unremarkable.  Spleen: Minimal fluid around the spleen, which is low-density.  Adrenals: Unremarkable.  Kidneys and ureters: No evidence of injury. Bilateral atrophy. No hydronephrosis.  Bladder: Unremarkable.  Reproductive: Unremarkable.  Bowel: No wall thickening.  No obstruction  Retroperitoneum: No hematoma.  Peritoneum: Small volume free fluid around the spleen and in the pelvis. The fluid is water density. Pelvic fluid noted on previous examination.  Vascular: Status post aorto bi-iliac stent graft. Coil embolization of the left hypogastric artery. Unchanged or smaller size of the left iliac artery aneurysm sac, 4 cm in diameter. Unchanged size of the aortic aneurysm sac, approximately 4 cm in maximal diameter. 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  OSSEOUS: Acute fractures of the right fourth, fifth, sixth, seventh, eighth, ninth ribs. There is up to 100% displacement inferiorly.  Multiple spinal fractures:  L5: Horizontal low-density cleft through the upper body. Compression is maximal posteriorly, approximately 25%. No subluxation.  L4: Inferior endplate fracture which  is new from prior and likely acute.  L3: Superior endplate compression fracture with height loss less than 50%. There is minimal bony retropulsion, without significant canal narrowing.  L1: Remote compression fracture. Exaggerated kyphosis at this level.  T12: Remote appearing Schmorl's node in the superior endplate with surrounding sclerosis.  T3: Horizontal fracture through the upper body with mild compression. This fracture appears acute.  There is no evidence of spinal subluxation or acute posterior element fractures.  Critical Value/emergent results were called by telephone at the time of interpretation on 08/12/2013 at 2:36 PM to Dr. Shirlyn Goltz , who verbally acknowledged these results.  IMPRESSION 1. Acute right fourth through ninth rib fractures. 2. Acute compression type vertebral fractures of L5, L4, L3, T12, and T3. No spinal subluxation or significant bony retropulsion. 3. Trace right  pneumothorax. 4. Moderate right pleural effusion which is primarily non hemorrhagic. There is a combination of atelectasis and pneumonia/aspiration in the right lower lobe. 5. Small volume fluid around the spleen without visible parenchymal abnormality; cannot exclude splenic injury without contrast. 6. Small volume pelvic ascites, chronic from October 2014. 7. Non-traumatic findings noted above.  SIGNATURE  Electronically Signed   By: Jorje Guild M.D.   On: 08/12/2013 14:36   Dg Chest 1 View  08/12/2013   CLINICAL DATA Fall  EXAM CHEST - 1 VIEW  COMPARISON 01/29/2013  FINDINGS Cardiac enlargement with vascular congestion and mild fluid overload. Small right pleural effusion has developed since the prior study. Mild bibasilar atelectasis. Underlying COPD.  Fractures of the right sixth and eighth and ninth ribs.  IMPRESSION Right rib fractures. Small right effusion may be due to heart failure or hemothorax.  Cardiac enlargement with mild fluid overload.  SIGNATURE  Electronically Signed   By: Franchot Gallo M.D.   On:  08/12/2013 12:47   Dg Lumbar Spine Complete  08/12/2013   CLINICAL DATA Fall  EXAM LUMBAR SPINE - COMPLETE 4+ VIEW  COMPARISON 03/09/2013  FINDINGS Aortoiliac stent graft in satisfactory position. Coils in the left hypogastric artery.  Chronic compression fractures of T12 and L1 are unchanged. There are new endplate deformities at L3, L4, and L5 consistent with recent fractures. MRI may be helpful for further evaluation of the spinal canal and to date these fractures.  IMPRESSION Mild fractures at L3, L4, and L5 not present on 03/09/2013 and possibly acute. Chronic fractures T12 and L1.  SIGNATURE  Electronically Signed   By: Franchot Gallo M.D.   On: 08/12/2013 12:50   Dg Pelvis 1-2 Views  08/12/2013   CLINICAL DATA Fall  EXAM PELVIS - 1-2 VIEW  COMPARISON None.  FINDINGS Negative for fracture in the pelvis. Both hips show mild degenerative change. Aortoiliac stent graft. Coils in the left hypogastric artery.  IMPRESSION Negative for fracture.  SIGNATURE  Electronically Signed   By: Franchot Gallo M.D.   On: 08/12/2013 12:47   Ct Head Wo Contrast  08/12/2013   CLINICAL DATA Patient on ground for 4 hr. Back pain. Patient fell this morning at 6 o'clock.  EXAM CT HEAD WITHOUT CONTRAST  CT CERVICAL SPINE WITHOUT CONTRAST  TECHNIQUE Multidetector CT imaging of the head and cervical spine was performed following the standard protocol without intravenous contrast. Multiplanar CT image reconstructions of the cervical spine were also generated.  COMPARISON CT HEAD W/O CM dated 06/11/2013  FINDINGS CT HEAD FINDINGS  There is no evidence of mass effect, midline shift, or extra-axial fluid collections. There is no evidence of a space-occupying lesion or intracranial hemorrhage. There is no evidence of a cortical-based area of acute infarction. There is generalized cerebral atrophy. There is periventricular white matter low attenuation likely secondary to microangiopathy.  The ventricles and sulci are appropriate for the  patient's age. The basal cisterns are patent.  Visualized portions of the orbits are unremarkable. The visualized portions of the paranasal sinuses and mastoid air cells are unremarkable. Cerebrovascular atherosclerotic calcifications are noted.  The osseous structures are unremarkable.  CT CERVICAL SPINE FINDINGS  The alignment is anatomic. The vertebral body heights are maintained. There is no acute fracture. There is no static listhesis. The prevertebral soft tissues are normal. The intraspinal soft tissues are not fully imaged on this examination due to poor soft tissue contrast, but there is no gross soft tissue abnormality.  There is degenerative  disc disease at C4-5, C5-6 and C6-7. There broad-based disc osteophyte complexes that C3-4, C4-5, C5-6 and C6-7. There is bilateral uncovertebral degenerative changes C5-6 resulting in foraminal encroachment.  The visualized portions of the lung apices demonstrate no focal abnormality.  IMPRESSION 1. No acute intracranial pathology. 2. No acute osseous injury cervical spine. 3. Cervical spine spondylosis as described above. Evaluation the cervical spine is somewhat limited as the C7 and T1 levels are excluded from the field of view.  SIGNATURE  Electronically Signed   By: Kathreen Devoid   On: 08/12/2013 13:46   Ct Chest Wo Contrast  08/12/2013   CLINICAL DATA Fall.  Trauma.  Lower back pain.  History of L1 fracture.  EXAM CT CHEST, ABDOMEN AND PELVIS WITHOUT CONTRAST  TECHNIQUE Multidetector CT imaging of the chest, abdomen and pelvis was performed following the standard protocol without IV contrast.  COMPARISON 03/09/2013 abdominal CT.  FINDINGS CT CHEST FINDINGS  THORACIC INLET/BODY WALL:  No acute abnormality.  MEDIASTINUM:  Cardiomegaly. No pericardial effusion. Previous aortic valvular replacement. Extensive coronary artery atherosclerosis. No evidence of acute aortic injury. No suspicious adenopathy.  LUNG WINDOWS:  There is airspace disease in the right lower  lobe. Small to moderate right pleural effusion which is layering water density. Trace left pleural effusion. There is a tiny pneumothorax in the ventral and lower right chest.  OSSEOUS:  See below  CT ABDOMEN AND PELVIS FINDINGS  Liver: No evidence of injury.  Biliary: No evidence of biliary obstruction or stone.  Pancreas: Unremarkable.  Spleen: Minimal fluid around the spleen, which is low-density.  Adrenals: Unremarkable.  Kidneys and ureters: No evidence of injury. Bilateral atrophy. No hydronephrosis.  Bladder: Unremarkable.  Reproductive: Unremarkable.  Bowel: No wall thickening.  No obstruction  Retroperitoneum: No hematoma.  Peritoneum: Small volume free fluid around the spleen and in the pelvis. The fluid is water density. Pelvic fluid noted on previous examination.  Vascular: Status post aorto bi-iliac stent graft. Coil embolization of the left hypogastric artery. Unchanged or smaller size of the left iliac artery aneurysm sac, 4 cm in diameter. Unchanged size of the aortic aneurysm sac, approximately 4 cm in maximal diameter. 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  OSSEOUS: Acute fractures of the right fourth, fifth, sixth, seventh, eighth, ninth ribs. There is up to 100% displacement inferiorly.  Multiple spinal fractures:  L5: Horizontal low-density cleft through the upper body. Compression is maximal posteriorly, approximately 25%. No subluxation.  L4: Inferior endplate fracture which is new from prior and likely acute.  L3: Superior endplate compression fracture with height loss less than 50%. There is minimal bony retropulsion, without significant canal narrowing.  L1: Remote compression fracture. Exaggerated kyphosis at this level.  T12: Remote appearing Schmorl's node in the superior endplate with surrounding sclerosis.  T3: Horizontal fracture through the upper body with mild compression. This fracture appears acute.  There is no evidence of spinal  subluxation or acute posterior element fractures.  Critical Value/emergent results were called by telephone at the time of interpretation on 08/12/2013 at 2:36 PM to Dr. Shirlyn Goltz , who verbally acknowledged these results.  IMPRESSION 1. Acute right fourth through ninth rib fractures. 2. Acute compression type vertebral fractures of L5, L4, L3, T12, and T3. No spinal subluxation or significant bony retropulsion. 3. Trace right pneumothorax. 4. Moderate right pleural effusion which is primarily non hemorrhagic. There is a combination of atelectasis and pneumonia/aspiration in the  right lower lobe. 5. Small volume fluid around the spleen without visible parenchymal abnormality; cannot exclude splenic injury without contrast. 6. Small volume pelvic ascites, chronic from October 2014. 7. Non-traumatic findings noted above.  SIGNATURE  Electronically Signed   By: Tiburcio Pea M.D.   On: 08/12/2013 14:36   Ct Cervical Spine Wo Contrast  08/12/2013   CLINICAL DATA Patient on ground for 4 hr. Back pain. Patient fell this morning at 6 o'clock.  EXAM CT HEAD WITHOUT CONTRAST  CT CERVICAL SPINE WITHOUT CONTRAST  TECHNIQUE Multidetector CT imaging of the head and cervical spine was performed following the standard protocol without intravenous contrast. Multiplanar CT image reconstructions of the cervical spine were also generated.  COMPARISON CT HEAD W/O CM dated 06/11/2013  FINDINGS CT HEAD FINDINGS  There is no evidence of mass effect, midline shift, or extra-axial fluid collections. There is no evidence of a space-occupying lesion or intracranial hemorrhage. There is no evidence of a cortical-based area of acute infarction. There is generalized cerebral atrophy. There is periventricular white matter low attenuation likely secondary to microangiopathy.  The ventricles and sulci are appropriate for the patient's age. The basal cisterns are patent.  Visualized portions of the orbits are unremarkable. The visualized portions  of the paranasal sinuses and mastoid air cells are unremarkable. Cerebrovascular atherosclerotic calcifications are noted.  The osseous structures are unremarkable.  CT CERVICAL SPINE FINDINGS  The alignment is anatomic. The vertebral body heights are maintained. There is no acute fracture. There is no static listhesis. The prevertebral soft tissues are normal. The intraspinal soft tissues are not fully imaged on this examination due to poor soft tissue contrast, but there is no gross soft tissue abnormality.  There is degenerative disc disease at C4-5, C5-6 and C6-7. There broad-based disc osteophyte complexes that C3-4, C4-5, C5-6 and C6-7. There is bilateral uncovertebral degenerative changes C5-6 resulting in foraminal encroachment.  The visualized portions of the lung apices demonstrate no focal abnormality.  IMPRESSION 1. No acute intracranial pathology. 2. No acute osseous injury cervical spine. 3. Cervical spine spondylosis as described above. Evaluation the cervical spine is somewhat limited as the C7 and T1 levels are excluded from the field of view.  SIGNATURE  Electronically Signed   By: Elige Ko   On: 08/12/2013 13:46   Dg Chest Port 1 View  08/18/2013   CLINICAL DATA:  Followup pneumothorax  EXAM: PORTABLE CHEST - 1 VIEW  COMPARISON:  08/17/2013, 08/16/2013  FINDINGS: No definite residual pneumothorax is seen on today's chest radiograph. Moderate large with a cardiopericardial silhouette is stable. Atherosclerotic calcification of the thoracic aorta. There changes of median sternotomy for aortic valve replacement. There is pulmonary vascular congestion. Bilateral pleural fluid effusions are present. There is bibasilar atelectasis. No significant change in aeration of the lungs compared to 08/17/2013.  IMPRESSION: 1. No visible pneumothorax on today's radiograph. 2. No significant change in bilateral pleural effusions and bibasilar atelectasis. 3. Pulmonary vascular congestion without definite  edema. Cardiomegaly.   Electronically Signed   By: Britta Mccreedy M.D.   On: 08/18/2013 08:32   Dg Chest Port 1 View  08/17/2013   CLINICAL DATA:  Hemothorax  EXAM: PORTABLE CHEST - 1 VIEW  COMPARISON:  08/16/2013  FINDINGS: Cardiac shadow is again enlarged. A right-sided thoracostomy catheter is again identified. A minimal right pneumothorax is noted which has actually improved in the interval from the prior exam. Diffuse a density is noted on the right. This likely represents some fluid trapped  within the major fissure. A small left-sided pleural effusion is again noted. No other new focal abnormality is seen.  IMPRESSION: Tiny right apical pneumothorax.  Increased density in the mid right lung likely representing some fluid in the major fissure.   Electronically Signed   By: Alcide Clever M.D.   On: 08/17/2013 07:21   Dg Chest Port 1 View  08/16/2013   CLINICAL DATA:  Right hemothorax due to multiple rib fractures.  EXAM: PORTABLE CHEST - 1 VIEW  COMPARISON:  Chest x-ray dated 08/15/2013  FINDINGS: Right chest tube remains in place. There is a tiny right apical pneumothorax. There has been re-accumulation of a small amount of fluid at the right lung base. Left effusion is unchanged.  Pulmonary vascularity is normal.  IMPRESSION: Reaccumulating fluid at the right lung base. Tiny right apical pneumothorax.  Stable small left effusion.   Electronically Signed   By: Geanie Cooley M.D.   On: 08/16/2013 20:31   Dg Chest Port 1 View  08/15/2013   CLINICAL DATA:  The pneumothorax  EXAM: PORTABLE CHEST - 1 VIEW  COMPARISON:  Prior chest x-ray 08/14/2013  FINDINGS: Trace right apical pneumothorax without significant interval change. The right-sided chest tube remains in unchanged position. Persistent right middle lobe atelectasis. Small left pleural effusion with associated atelectasis. Stable cardiomegaly. Atherosclerotic calcifications in the transverse aorta. No acute osseous abnormality.  IMPRESSION: 1. Stable  trace right apical pneumothorax with chest tube in place. 2. Persistent right middle lobe atelectasis. 3. Unchanged left pleural effusion and basilar atelectasis.   Electronically Signed   By: Malachy Moan M.D.   On: 08/15/2013 07:55   Dg Chest Port 1 View  08/14/2013   CLINICAL DATA:  Right rib fractures, pneumothorax  EXAM: PORTABLE CHEST - 1 VIEW  COMPARISON:  Portable exam 0554 hr compared to 08/13/2013  FINDINGS: Right thoracostomy tube stable.  Enlargement of cardiac silhouette post MVR.  Atherosclerotic calcification aorta.  Mediastinal contours and pulmonary vascularity otherwise normal.  Tiny residual right apex pneumothorax, decreased.  Subsegmental atelectasis right base.  Atelectasis versus consolidation left lower lobe.  Probable underlying COPD with improved pulmonary edema.  No pneumothorax.  Bones demineralized.  IMPRESSION: Tiny residual right pneumothorax post thoracostomy tube.  Linear subsegmental atelectasis right lung base with atelectasis versus consolidation in left lower lobe.  Improved pulmonary edema.   Electronically Signed   By: Ulyses Southward M.D.   On: 08/14/2013 08:09   Dg Chest Port 1 View  08/13/2013   CLINICAL DATA Pneumothorax.  EXAM PORTABLE CHEST - 1 VIEW  COMPARISON DG CHEST 1V PORT dated 08/12/2013; CT CHEST W/O CM dated 08/12/2013  FINDINGS Right chest tube in stable position. Stable tiny right apical pneumothorax noted. Atelectatic changes in the lung bases. Developing infiltrate and/or atelectasis right mid lung field. Prior CABG. Severe cardiomegaly. Mild interstitial prominence noted. These findings suggest congestive heart failure. Multiple right rib fractures are present. These are better demonstrated on recent CT.  IMPRESSION 1. Right chest tube in stable position. Tiny stable right apical pneumothorax. 2. Congestive heart failure with mild interstitial edema. 3. Bibasilar atelectasis. Developing atelectasis and/or infiltrate in the right mid lung noted. 4. Right  rib fractures, better demonstrated by recent CT.  SIGNATURE  Electronically Signed   By: Maisie Fus  Register   On: 08/13/2013 07:56   Dg Chest Port 1 View  08/12/2013   CLINICAL DATA Chest tube placement  EXAM PORTABLE CHEST - 1 VIEW  COMPARISON 08/12/2013  FINDINGS Right chest tube  placed in good position with decrease in right pleural effusion. Minimal right apical pneumothorax. Improved aeration in the right lung base  Cardiac enlargement. Improvement in pulmonary vascular congestion. Left lower lobe atelectasis unchanged.  IMPRESSION Right chest tube placement with decrease in right pleural effusion. Minimal right apical pneumothorax  Improvement in congestive heart failure.  Bibasilar atelectasis with improved aeration in the right lung base.  SIGNATURE  Electronically Signed   By: Franchot Gallo M.D.   On: 08/12/2013 15:52    ASSESSMENT AND PLAN 1. Permanent atrial fibrillation. No longer a candidate for coumadin going forward because of multiple falls.  2. status post bioprosthetic aortic valve replacement.  3. Blood loss anemia improving.  4. Multiple right rib fractures with hemothorax.  5. Lumbar 2-4 compression fractures.  6. History of renal insufficiency. Creatinine stable since restarting low-dose Lasix.  Weight is down 3 pounds since yesterday.  Continue current cardiac meds    Signed, Darlin Coco MD

## 2013-08-18 NOTE — Progress Notes (Signed)
BP 180/70.  PA notified.  No new orders at this time.  Will continue to monitor. Syliva Overman

## 2013-08-18 NOTE — Progress Notes (Signed)
Patient ID: Evan Perez, male   DOB: 15-Dec-1922, 80 y.o.   MRN: [id number]   LOS: 6 days   Subjective: C/o left heel pain and chest tube site pain. Otherwise about the same.   Objective: Vital signs in last 24 hours: Temp:  [97.6 F (36.4 C)-98.7 F (37.1 C)] 98.3 F (36.8 C) (03/17 0700) Pulse Rate:  [78-112] 112 (03/17 0505) Resp:  [13-21] 20 (03/17 0505) BP: (149-160)/(52-83) 158/52 mmHg (03/17 0505) SpO2:  [94 %-100 %] 100 % (03/17 0505) Weight:  [162 lb 0.6 oz (73.5 kg)] 162 lb 0.6 oz (73.5 kg) (03/17 0505) Last BM Date: 08/11/13   IS: 577ml (=)   Laboratory  CBC  Recent Labs  08/17/13 0853 08/18/13 0317  WBC 10.3 10.5  HGB 8.6* 8.0*  HCT 26.4* 25.1*  PLT 136* 155   BMET  Recent Labs  08/17/13 0853 08/18/13 0317  NA 131* 132*  K 4.2 4.2  CL 91* 92*  CO2 31 29  GLUCOSE 88 93  BUN 51* 48*  CREATININE 1.54* 1.56*  CALCIUM 8.5 8.2*    Radiology Results CXR: Slightly increased right PTX, increased right effusion (official read pending)   Physical Exam General appearance: alert and no distress Resp: diminished breath sounds base - right Cardio: irregularly irregular rhythm GI: normal findings: bowel sounds normal and soft, non-tender   Assessment/Plan: Fall  Multiple right rib fxs w/HTX s/p CT -- Repeat CXR tomorrow to eval effusion L2-4 compression fxs -- Lumbar corset  Afib w/RVR and elevated troponins -- Appreciate cardiology consult  Acute on chronic anemia -- Stable  Multiple medical problems -- Stable, on home meds except coumadin which is not to be continued due to fall risk per cardiology  FEN -- Increase bowel regimen VTE -- SCD's  Dispo -- Transfer to tele, try to wean O2    Lisette Abu, PA-C Pager: [phone] General Trauma PA Pager: [phone]  08/18/2013

## 2013-08-19 ENCOUNTER — Inpatient Hospital Stay (HOSPITAL_COMMUNITY): Payer: Medicare Other

## 2013-08-19 LAB — CBC
HCT: 25.4 % — ABNORMAL LOW (ref 39.0–52.0)
Hemoglobin: 8.2 g/dL — ABNORMAL LOW (ref 13.0–17.0)
MCH: 29.4 pg (ref 26.0–34.0)
MCHC: 32.3 g/dL (ref 30.0–36.0)
MCV: 91 fL (ref 78.0–100.0)
PLATELETS: 177 10*3/uL (ref 150–400)
RBC: 2.79 MIL/uL — AB (ref 4.22–5.81)
RDW: 18.6 % — ABNORMAL HIGH (ref 11.5–15.5)
WBC: 10 10*3/uL (ref 4.0–10.5)

## 2013-08-19 MED ORDER — FUROSEMIDE 40 MG PO TABS
60.0000 mg | ORAL_TABLET | Freq: Two times a day (BID) | ORAL | Status: DC
  Administered 2013-08-19 – 2013-08-20 (×3): 60 mg via ORAL
  Filled 2013-08-19 (×4): qty 1

## 2013-08-19 NOTE — Progress Notes (Signed)
Patient ID: Evan Perez, male   DOB: 06/01/1923, 80 y.o.   MRN: [id number]   LOS: 7 days   Subjective: Feels about the same.   Objective: Vital signs in last 24 hours: Temp:  [97.1 F (36.2 C)-98.2 F (36.8 C)] 97.5 F (36.4 C) (03/18 0505) Pulse Rate:  [83-104] 83 (03/18 0505) Resp:  [16-19] 18 (03/18 0505) BP: (158-199)/(56-82) 162/82 mmHg (03/18 0505) SpO2:  [95 %-98 %] 98 % (03/18 0505) Weight:  [163 lb 6.7 oz (74.125 kg)] 163 lb 6.7 oz (74.125 kg) (03/18 0505) Last BM Date: 08/18/13   IS: 210ml (-223ml)   Laboratory  CBC  Recent Labs  08/18/13 0317 08/19/13 0427  WBC 10.5 10.0  HGB 8.0* 8.2*  HCT 25.1* 25.4*  PLT 155 177    Radiology Results CHEST 2 VIEW  COMPARISON: 08/18/2013  FINDINGS:  Cardiomegaly again noted. Status post median sternotomy. Persistent  bilateral pleural effusion with bilateral basilar atelectasis or  infiltrate. Small amount of fluid in right minor fissure. No  convincing pulmonary edema. Atherosclerotic calcifications of  thoracic aorta.  IMPRESSION:  Persistent bilateral pleural effusion with bilateral basilar  atelectasis or infiltrate. Small amount of fluid in right minor  fissure. No convincing pulmonary edema.  Electronically Signed  By: Lahoma Crocker M.D.  On: 08/19/2013 08:23   Physical Exam General appearance: alert and no distress Resp: diminished breath sounds base - right Cardio: irregularly irregular rhythm GI: normal findings: bowel sounds normal and soft, non-tender   Assessment/Plan: Fall  Multiple right rib fxs w/HTX s/p CT -- Although read as stable it appears that the right effusion has worsened to me and his decreased vital capacity as measured by his IS would bear that out. His O2 was able to be weaned somewhat and is currently at 1.5L. I've asked pulmonology to re-evaluate but if they think he's stable can likely d/c to SNF today. L2-4 compression fxs -- Lumbar corset  Afib w/RVR and elevated troponins  -- Appreciate cardiology consult  Acute on chronic anemia -- Stable  Multiple medical problems -- Stable, on home meds except coumadin which is not to be continued due to fall risk per cardiology  FEN -- No issues VTE -- SCD's  Dispo -- D/C to SNF once pulmonology clears    Lisette Abu, PA-C Pager: [phone] General Trauma PA Pager: [phone]  08/19/2013

## 2013-08-19 NOTE — Progress Notes (Signed)
Physical Therapy Treatment Patient Details Name: Evan Perez MRN: [id number] DOB: 1923/02/01 Today's Date: 08/19/2013 Time: 8850-2774 PT Time Calculation (min): 25 min  PT Assessment / Plan / Recommendation  History of Present Illness 80 yo male who fell yesterday and was found down on his floor. He was brought to [facility] ED where he was evaluated and found to have multiple mild compression fractures in the lumbar region.   PT Comments   Patient eager to get moving and walking with therapy however required several rest breaks with encouragement to purse lip breath.   Follow Up Recommendations  SNF     Does the patient have the potential to tolerate intense rehabilitation     Barriers to Discharge        Equipment Recommendations  3in1 (PT)    Recommendations for Other Services    Frequency Min 3X/week   Progress towards PT Goals Progress towards PT goals: Progressing toward goals  Plan Current plan remains appropriate    Precautions / Restrictions Precautions Precautions: Back;Fall Precaution Comments: Patient sitting in recliner without brace. Educated patient on brace when out of bed.  Required Braces or Orthoses: Spinal Brace Spinal Brace: Lumbar corset Restrictions Weight Bearing Restrictions: No   Pertinent Vitals/Pain no apparent distress     Mobility  Bed Mobility General bed mobility comments: pt up in chair upon arrival Transfers Overall transfer level: Needs assistance Equipment used: 1 person hand held assist;Rolling walker (2 wheeled) General transfer comment: A to initiate stand and cues for upright posture and hand placement Ambulation/Gait Ambulation/Gait assistance: Min assist Ambulation Distance (Feet): 150 Feet Assistive device: Rolling walker (2 wheeled) Gait Pattern/deviations: Step-through pattern Gait velocity: slow; guarded for falls General Gait Details: pt req 4 standing rest breaks this session due to fatigue. pt req RW for support and to  amb    Exercises     PT Diagnosis:    PT Problem List:   PT Treatment Interventions:     PT Goals (current goals can now be found in the care plan section)    Visit Information  Last PT Received On: 08/19/13 Assistance Needed: +1 History of Present Illness: 80 yo male who fell yesterday and was found down on his floor. He was brought to [facility] ED where he was evaluated and found to have multiple mild compression fractures in the lumbar region.    Subjective Data      Cognition  Cognition Arousal/Alertness: Awake/alert Behavior During Therapy: WFL for tasks assessed/performed Overall Cognitive Status: Within Functional Limits for tasks assessed    Balance     End of Session PT - End of Session Equipment Utilized During Treatment: Gait belt;Back brace;Oxygen Activity Tolerance: Patient limited by fatigue Patient left: in chair;with call bell/phone within reach;with family/visitor present Nurse Communication: Mobility status   GP     Jacqualyn Posey 08/19/2013, 11:45 AM 08/19/2013 Jacqualyn Posey PTA [phone] pager [phone] office

## 2013-08-19 NOTE — Progress Notes (Signed)
Patient: Evan Perez / Admit Date: 08/12/2013 / Date of Encounter: 08/19/2013, 11:04 AM  Subjective  Mr. Winship states feeling well today overall with no new complaints.  He reports feeling like he would like to get up more and walk in the hall.  He remains in AF with rate 83-104 over night.  Pt denies chest pain other than his chest wall pain s/p rib fx.  He denies SOB, however reports feeling like he "can't take a deep breath".  He also reports mild LE tenderness which he states is normal for him due to fluid retention.  He denies N/V, dizziness. He reports that the scrotal edema is starting to increase again.  He says he called our answering service last night to report that the beeping machine in his room was keeping him from sleep. He is now in a different, more quiet room.  Objective   Telemetry: Persistent AF with rate 83-104.  Occasional PVC.    Physical Exam: Blood pressure 162/82, pulse 83, temperature 97.5 F (36.4 C), temperature source Oral, resp. rate 18, height 5\' 10"  (1.778 m), weight 163 lb 6.7 oz (74.125 kg), SpO2 98.00%. General: Well developed, well nourished, in no acute distress. Head: Normocephalic, atraumatic, sclera non-icteric, no xanthomas, nares are without discharge. Neck: JVP not elevated. Lungs: Clear bilaterally to auscultation in upper lobes, diminished in lower lobes without wheezes, rales, or rhonchi. Breathing is unlabored.  Heart: Irregular rhythm with rate appx 90.  S1, S2 distinct with systolic murmur noted best heard at the 2nd L ICS. Crisp S2 Abdomen: Abd exam limited due to lumbar corset in place.  Extremities: Tr-1+ pitting edema with tenderness/redness bilaterally.  Pt states his LE are much less swollen than usual.  Pulses 1+ DP bilaterally.   Uro: increasing scrotal edema Neuro: Alert and oriented X 3. Moves all extremities spontaneously. Psych:  Responds to questions appropriately with a normal affect.   Intake/Output Summary (Last 24 hours)  at 08/19/13 1104 Last data filed at 08/19/13 0931  Gross per 24 hour  Intake    480 ml  Output   1700 ml  Net  -1220 ml    Inpatient Medications:  . aspirin EC  81 mg Oral Q M,W,F  . bisacodyl  10 mg Oral Daily  . cloNIDine  0.2 mg Oral BID  . diltiazem  120 mg Oral Daily  . docusate sodium  200 mg Oral BID  . furosemide  40 mg Oral BID  . levothyroxine  25 mcg Oral QAC breakfast  . multivitamin with minerals  1 tablet Oral Daily  . polyethylene glycol  17 g Oral Daily   Infusions:    Labs:  Recent Labs  08/17/13 0853 08/18/13 0317  NA 131* 132*  K 4.2 4.2  CL 91* 92*  CO2 31 29  GLUCOSE 88 93  BUN 51* 48*  CREATININE 1.54* 1.56*  CALCIUM 8.5 8.2*   No results found for this basename: AST, ALT, ALKPHOS, BILITOT, PROT, ALBUMIN,  in the last 72 hours  Recent Labs  08/18/13 0317 08/19/13 0427  WBC 10.5 10.0  HGB 8.0* 8.2*  HCT 25.1* 25.4*  MCV 91.3 91.0  PLT 155 177   No results found for this basename: CKTOTAL, CKMB, TROPONINI,  in the last 72 hours No components found with this basename: POCBNP,  No results found for this basename: HGBA1C,  in the last 72 hours   Radiology/Studies:  Ct Abdomen Pelvis Wo Contrast  08/12/2013   CLINICAL DATA  Fall.  Trauma.  Lower back pain.  History of L1 fracture.  EXAM CT CHEST, ABDOMEN AND PELVIS WITHOUT CONTRAST  TECHNIQUE Multidetector CT imaging of the chest, abdomen and pelvis was performed following the standard protocol without IV contrast.  COMPARISON 03/09/2013 abdominal CT.  FINDINGS CT CHEST FINDINGS  THORACIC INLET/BODY WALL:  No acute abnormality.  MEDIASTINUM:  Cardiomegaly. No pericardial effusion. Previous aortic valvular replacement. Extensive coronary artery atherosclerosis. No evidence of acute aortic injury. No suspicious adenopathy.  LUNG WINDOWS:  There is airspace disease in the right lower lobe. Small to moderate right pleural effusion which is layering water density. Trace left pleural effusion. There is a  tiny pneumothorax in the ventral and lower right chest.  OSSEOUS:  See below  CT ABDOMEN AND PELVIS FINDINGS  Liver: No evidence of injury.  Biliary: No evidence of biliary obstruction or stone.  Pancreas: Unremarkable.  Spleen: Minimal fluid around the spleen, which is low-density.  Adrenals: Unremarkable.  Kidneys and ureters: No evidence of injury. Bilateral atrophy. No hydronephrosis.  Bladder: Unremarkable.  Reproductive: Unremarkable.  Bowel: No wall thickening.  No obstruction  Retroperitoneum: No hematoma.  Peritoneum: Small volume free fluid around the spleen and in the pelvis. The fluid is water density. Pelvic fluid noted on previous examination.  Vascular: Status post aorto bi-iliac stent graft. Coil embolization of the left hypogastric artery. Unchanged or smaller size of the left iliac artery aneurysm sac, 4 cm in diameter. Unchanged size of the aortic aneurysm sac, approximately 4 cm in maximal diameter. 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  OSSEOUS: Acute fractures of the right fourth, fifth, sixth, seventh, eighth, ninth ribs. There is up to 100% displacement inferiorly.  Multiple spinal fractures:  L5: Horizontal low-density cleft through the upper body. Compression is maximal posteriorly, approximately 25%. No subluxation.  L4: Inferior endplate fracture which is new from prior and likely acute.  L3: Superior endplate compression fracture with height loss less than 50%. There is minimal bony retropulsion, without significant canal narrowing.  L1: Remote compression fracture. Exaggerated kyphosis at this level.  T12: Remote appearing Schmorl's node in the superior endplate with surrounding sclerosis.  T3: Horizontal fracture through the upper body with mild compression. This fracture appears acute.  There is no evidence of spinal subluxation or acute posterior element fractures.  Critical Value/emergent results were called by telephone at the time of  interpretation on 08/12/2013 at 2:36 PM to Dr. Shirlyn Goltz , who verbally acknowledged these results.  IMPRESSION 1. Acute right fourth through ninth rib fractures. 2. Acute compression type vertebral fractures of L5, L4, L3, T12, and T3. No spinal subluxation or significant bony retropulsion. 3. Trace right pneumothorax. 4. Moderate right pleural effusion which is primarily non hemorrhagic. There is a combination of atelectasis and pneumonia/aspiration in the right lower lobe. 5. Small volume fluid around the spleen without visible parenchymal abnormality; cannot exclude splenic injury without contrast. 6. Small volume pelvic ascites, chronic from October 2014. 7. Non-traumatic findings noted above.  SIGNATURE  Electronically Signed   By: Jorje Guild M.D.   On: 08/12/2013 14:36   Dg Chest 1 View  08/12/2013   CLINICAL DATA Fall  EXAM CHEST - 1 VIEW  COMPARISON 01/29/2013  FINDINGS Cardiac enlargement with vascular congestion and mild fluid overload. Small right pleural effusion has developed since the prior study. Mild bibasilar atelectasis. Underlying COPD.  Fractures of the right sixth and eighth and ninth ribs.  IMPRESSION Right rib fractures. Small right effusion may be due to heart failure or hemothorax.  Cardiac enlargement with mild fluid overload.  SIGNATURE  Electronically Signed   By: Marlan Palau M.D.   On: 08/12/2013 12:47   Dg Chest 2 View  08/19/2013   CLINICAL DATA:  Shortness of breath, effusion  EXAM: CHEST  2 VIEW  COMPARISON:  08/18/2013  FINDINGS: Cardiomegaly again noted. Status post median sternotomy. Persistent bilateral pleural effusion with bilateral basilar atelectasis or infiltrate. Small amount of fluid in right minor fissure. No convincing pulmonary edema. Atherosclerotic calcifications of thoracic aorta.  IMPRESSION: Persistent bilateral pleural effusion with bilateral basilar atelectasis or infiltrate. Small amount of fluid in right minor fissure. No convincing pulmonary  edema.   Electronically Signed   By: Natasha Mead M.D.   On: 08/19/2013 08:23   Dg Lumbar Spine Complete  08/12/2013   CLINICAL DATA Fall  EXAM LUMBAR SPINE - COMPLETE 4+ VIEW  COMPARISON 03/09/2013  FINDINGS Aortoiliac stent graft in satisfactory position. Coils in the left hypogastric artery.  Chronic compression fractures of T12 and L1 are unchanged. There are new endplate deformities at L3, L4, and L5 consistent with recent fractures. MRI may be helpful for further evaluation of the spinal canal and to date these fractures.  IMPRESSION Mild fractures at L3, L4, and L5 not present on 03/09/2013 and possibly acute. Chronic fractures T12 and L1.  SIGNATURE  Electronically Signed   By: Marlan Palau M.D.   On: 08/12/2013 12:50   Dg Pelvis 1-2 Views  08/12/2013   CLINICAL DATA Fall  EXAM PELVIS - 1-2 VIEW  COMPARISON None.  FINDINGS Negative for fracture in the pelvis. Both hips show mild degenerative change. Aortoiliac stent graft. Coils in the left hypogastric artery.  IMPRESSION Negative for fracture.  SIGNATURE  Electronically Signed   By: Marlan Palau M.D.   On: 08/12/2013 12:47   Ct Head Wo Contrast  08/12/2013   CLINICAL DATA Patient on ground for 4 hr. Back pain. Patient fell this morning at 6 o'clock.  EXAM CT HEAD WITHOUT CONTRAST  CT CERVICAL SPINE WITHOUT CONTRAST  TECHNIQUE Multidetector CT imaging of the head and cervical spine was performed following the standard protocol without intravenous contrast. Multiplanar CT image reconstructions of the cervical spine were also generated.  COMPARISON CT HEAD W/O CM dated 06/11/2013  FINDINGS CT HEAD FINDINGS  There is no evidence of mass effect, midline shift, or extra-axial fluid collections. There is no evidence of a space-occupying lesion or intracranial hemorrhage. There is no evidence of a cortical-based area of acute infarction. There is generalized cerebral atrophy. There is periventricular white matter low attenuation likely secondary to  microangiopathy.  The ventricles and sulci are appropriate for the patient's age. The basal cisterns are patent.  Visualized portions of the orbits are unremarkable. The visualized portions of the paranasal sinuses and mastoid air cells are unremarkable. Cerebrovascular atherosclerotic calcifications are noted.  The osseous structures are unremarkable.  CT CERVICAL SPINE FINDINGS  The alignment is anatomic. The vertebral body heights are maintained. There is no acute fracture. There is no static listhesis. The prevertebral soft tissues are normal. The intraspinal soft tissues are not fully imaged on this examination due to poor soft tissue contrast, but there is no gross soft tissue abnormality.  There is degenerative disc disease at C4-5, C5-6 and C6-7. There broad-based disc osteophyte complexes that C3-4, C4-5, C5-6 and C6-7. There is bilateral uncovertebral degenerative changes C5-6 resulting in foraminal encroachment.  The visualized portions of the lung apices demonstrate no focal abnormality.  IMPRESSION 1. No acute intracranial pathology. 2. No acute osseous injury cervical spine. 3. Cervical spine spondylosis as described above. Evaluation the cervical spine is somewhat limited as the C7 and T1 levels are excluded from the field of view.  SIGNATURE  Electronically Signed   By: Kathreen Devoid   On: 08/12/2013 13:46   Ct Chest Wo Contrast  08/12/2013   CLINICAL DATA Fall.  Trauma.  Lower back pain.  History of L1 fracture.  EXAM CT CHEST, ABDOMEN AND PELVIS WITHOUT CONTRAST  TECHNIQUE Multidetector CT imaging of the chest, abdomen and pelvis was performed following the standard protocol without IV contrast.  COMPARISON 03/09/2013 abdominal CT.  FINDINGS CT CHEST FINDINGS  THORACIC INLET/BODY WALL:  No acute abnormality.  MEDIASTINUM:  Cardiomegaly. No pericardial effusion. Previous aortic valvular replacement. Extensive coronary artery atherosclerosis. No evidence of acute aortic injury. No suspicious  adenopathy.  LUNG WINDOWS:  There is airspace disease in the right lower lobe. Small to moderate right pleural effusion which is layering water density. Trace left pleural effusion. There is a tiny pneumothorax in the ventral and lower right chest.  OSSEOUS:  See below  CT ABDOMEN AND PELVIS FINDINGS  Liver: No evidence of injury.  Biliary: No evidence of biliary obstruction or stone.  Pancreas: Unremarkable.  Spleen: Minimal fluid around the spleen, which is low-density.  Adrenals: Unremarkable.  Kidneys and ureters: No evidence of injury. Bilateral atrophy. No hydronephrosis.  Bladder: Unremarkable.  Reproductive: Unremarkable.  Bowel: No wall thickening.  No obstruction  Retroperitoneum: No hematoma.  Peritoneum: Small volume free fluid around the spleen and in the pelvis. The fluid is water density. Pelvic fluid noted on previous examination.  Vascular: Status post aorto bi-iliac stent graft. Coil embolization of the left hypogastric artery. Unchanged or smaller size of the left iliac artery aneurysm sac, 4 cm in diameter. Unchanged size of the aortic aneurysm sac, approximately 4 cm in maximal diameter. 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  OSSEOUS: Acute fractures of the right fourth, fifth, sixth, seventh, eighth, ninth ribs. There is up to 100% displacement inferiorly.  Multiple spinal fractures:  L5: Horizontal low-density cleft through the upper body. Compression is maximal posteriorly, approximately 25%. No subluxation.  L4: Inferior endplate fracture which is new from prior and likely acute.  L3: Superior endplate compression fracture with height loss less than 50%. There is minimal bony retropulsion, without significant canal narrowing.  L1: Remote compression fracture. Exaggerated kyphosis at this level.  T12: Remote appearing Schmorl's node in the superior endplate with surrounding sclerosis.  T3: Horizontal fracture through the upper body with mild  compression. This fracture appears acute.  There is no evidence of spinal subluxation or acute posterior element fractures.  Critical Value/emergent results were called by telephone at the time of interpretation on 08/12/2013 at 2:36 PM to Dr. Shirlyn Goltz , who verbally acknowledged these results.  IMPRESSION 1. Acute right fourth through ninth rib fractures. 2. Acute compression type vertebral fractures of L5, L4, L3, T12, and T3. No spinal subluxation or significant bony retropulsion. 3. Trace right pneumothorax. 4. Moderate right pleural effusion which is primarily non hemorrhagic. There is a combination of atelectasis and pneumonia/aspiration in the right lower lobe. 5. Small volume fluid around the spleen without visible parenchymal abnormality; cannot exclude splenic injury without contrast. 6. Small volume pelvic ascites, chronic from October 2014. 7.  Non-traumatic findings noted above.  SIGNATURE  Electronically Signed   By: Jorje Guild M.D.   On: 08/12/2013 14:36   Ct Cervical Spine Wo Contrast  08/12/2013   CLINICAL DATA Patient on ground for 4 hr. Back pain. Patient fell this morning at 6 o'clock.  EXAM CT HEAD WITHOUT CONTRAST  CT CERVICAL SPINE WITHOUT CONTRAST  TECHNIQUE Multidetector CT imaging of the head and cervical spine was performed following the standard protocol without intravenous contrast. Multiplanar CT image reconstructions of the cervical spine were also generated.  COMPARISON CT HEAD W/O CM dated 06/11/2013  FINDINGS CT HEAD FINDINGS  There is no evidence of mass effect, midline shift, or extra-axial fluid collections. There is no evidence of a space-occupying lesion or intracranial hemorrhage. There is no evidence of a cortical-based area of acute infarction. There is generalized cerebral atrophy. There is periventricular white matter low attenuation likely secondary to microangiopathy.  The ventricles and sulci are appropriate for the patient's age. The basal cisterns are patent.   Visualized portions of the orbits are unremarkable. The visualized portions of the paranasal sinuses and mastoid air cells are unremarkable. Cerebrovascular atherosclerotic calcifications are noted.  The osseous structures are unremarkable.  CT CERVICAL SPINE FINDINGS  The alignment is anatomic. The vertebral body heights are maintained. There is no acute fracture. There is no static listhesis. The prevertebral soft tissues are normal. The intraspinal soft tissues are not fully imaged on this examination due to poor soft tissue contrast, but there is no gross soft tissue abnormality.  There is degenerative disc disease at C4-5, C5-6 and C6-7. There broad-based disc osteophyte complexes that C3-4, C4-5, C5-6 and C6-7. There is bilateral uncovertebral degenerative changes C5-6 resulting in foraminal encroachment.  The visualized portions of the lung apices demonstrate no focal abnormality.  IMPRESSION 1. No acute intracranial pathology. 2. No acute osseous injury cervical spine. 3. Cervical spine spondylosis as described above. Evaluation the cervical spine is somewhat limited as the C7 and T1 levels are excluded from the field of view.  SIGNATURE  Electronically Signed   By: Kathreen Devoid   On: 08/12/2013 13:46   Dg Chest Port 1 View  08/18/2013   CLINICAL DATA:  Followup pneumothorax  EXAM: PORTABLE CHEST - 1 VIEW  COMPARISON:  08/17/2013, 08/16/2013  FINDINGS: No definite residual pneumothorax is seen on today's chest radiograph. Moderate large with a cardiopericardial silhouette is stable. Atherosclerotic calcification of the thoracic aorta. There changes of median sternotomy for aortic valve replacement. There is pulmonary vascular congestion. Bilateral pleural fluid effusions are present. There is bibasilar atelectasis. No significant change in aeration of the lungs compared to 08/17/2013.  IMPRESSION: 1. No visible pneumothorax on today's radiograph. 2. No significant change in bilateral pleural effusions and  bibasilar atelectasis. 3. Pulmonary vascular congestion without definite edema. Cardiomegaly.   Electronically Signed   By: Curlene Dolphin M.D.   On: 08/18/2013 08:32   Dg Chest Port 1 View  08/17/2013   CLINICAL DATA:  Hemothorax  EXAM: PORTABLE CHEST - 1 VIEW  COMPARISON:  08/16/2013  FINDINGS: Cardiac shadow is again enlarged. A right-sided thoracostomy catheter is again identified. A minimal right pneumothorax is noted which has actually improved in the interval from the prior exam. Diffuse a density is noted on the right. This likely represents some fluid trapped within the major fissure. A small left-sided pleural effusion is again noted. No other new focal abnormality is seen.  IMPRESSION: Tiny right apical pneumothorax.  Increased density in the  mid right lung likely representing some fluid in the major fissure.   Electronically Signed   By: Alcide Clever M.D.   On: 08/17/2013 07:21   Dg Chest Port 1 View  08/16/2013   CLINICAL DATA:  Right hemothorax due to multiple rib fractures.  EXAM: PORTABLE CHEST - 1 VIEW  COMPARISON:  Chest x-ray dated 08/15/2013  FINDINGS: Right chest tube remains in place. There is a tiny right apical pneumothorax. There has been re-accumulation of a small amount of fluid at the right lung base. Left effusion is unchanged.  Pulmonary vascularity is normal.  IMPRESSION: Reaccumulating fluid at the right lung base. Tiny right apical pneumothorax.  Stable small left effusion.   Electronically Signed   By: Geanie Cooley M.D.   On: 08/16/2013 20:31   Dg Chest Port 1 View  08/15/2013   CLINICAL DATA:  The pneumothorax  EXAM: PORTABLE CHEST - 1 VIEW  COMPARISON:  Prior chest x-ray 08/14/2013  FINDINGS: Trace right apical pneumothorax without significant interval change. The right-sided chest tube remains in unchanged position. Persistent right middle lobe atelectasis. Small left pleural effusion with associated atelectasis. Stable cardiomegaly. Atherosclerotic calcifications in the  transverse aorta. No acute osseous abnormality.  IMPRESSION: 1. Stable trace right apical pneumothorax with chest tube in place. 2. Persistent right middle lobe atelectasis. 3. Unchanged left pleural effusion and basilar atelectasis.   Electronically Signed   By: Malachy Moan M.D.   On: 08/15/2013 07:55   Dg Chest Port 1 View  08/14/2013   CLINICAL DATA:  Right rib fractures, pneumothorax  EXAM: PORTABLE CHEST - 1 VIEW  COMPARISON:  Portable exam 0554 hr compared to 08/13/2013  FINDINGS: Right thoracostomy tube stable.  Enlargement of cardiac silhouette post MVR.  Atherosclerotic calcification aorta.  Mediastinal contours and pulmonary vascularity otherwise normal.  Tiny residual right apex pneumothorax, decreased.  Subsegmental atelectasis right base.  Atelectasis versus consolidation left lower lobe.  Probable underlying COPD with improved pulmonary edema.  No pneumothorax.  Bones demineralized.  IMPRESSION: Tiny residual right pneumothorax post thoracostomy tube.  Linear subsegmental atelectasis right lung base with atelectasis versus consolidation in left lower lobe.  Improved pulmonary edema.   Electronically Signed   By: Ulyses Southward M.D.   On: 08/14/2013 08:09   Dg Chest Port 1 View  08/13/2013   CLINICAL DATA Pneumothorax.  EXAM PORTABLE CHEST - 1 VIEW  COMPARISON DG CHEST 1V PORT dated 08/12/2013; CT CHEST W/O CM dated 08/12/2013  FINDINGS Right chest tube in stable position. Stable tiny right apical pneumothorax noted. Atelectatic changes in the lung bases. Developing infiltrate and/or atelectasis right mid lung field. Prior CABG. Severe cardiomegaly. Mild interstitial prominence noted. These findings suggest congestive heart failure. Multiple right rib fractures are present. These are better demonstrated on recent CT.  IMPRESSION 1. Right chest tube in stable position. Tiny stable right apical pneumothorax. 2. Congestive heart failure with mild interstitial edema. 3. Bibasilar atelectasis.  Developing atelectasis and/or infiltrate in the right mid lung noted. 4. Right rib fractures, better demonstrated by recent CT.  SIGNATURE  Electronically Signed   By: Maisie Fus  Register   On: 08/13/2013 07:56   Dg Chest Port 1 View  08/12/2013   CLINICAL DATA Chest tube placement  EXAM PORTABLE CHEST - 1 VIEW  COMPARISON 08/12/2013  FINDINGS Right chest tube placed in good position with decrease in right pleural effusion. Minimal right apical pneumothorax. Improved aeration in the right lung base  Cardiac enlargement. Improvement in pulmonary vascular congestion. Left  lower lobe atelectasis unchanged.  IMPRESSION Right chest tube placement with decrease in right pleural effusion. Minimal right apical pneumothorax  Improvement in congestive heart failure.  Bibasilar atelectasis with improved aeration in the right lung base.  SIGNATURE  Electronically Signed   By: Franchot Gallo M.D.   On: 08/12/2013 15:52     Assessment and Plan  1. Mechanical fall with recent frequent falls, and subsequent multiple rib fractures and vertebral compression fractures  2. R hemothorax s/p chest tube with bloody drainage, felt exudative due to trauma  3. Acute on chronic anemia likely due to above  4. Chronic atrial fibrillation with elevated heart rate on admission, deemed no longer a Coumadin candidate this admission  5. Mildly elevated POC troponin with mild nonobstructive CAD by cath 2011, suspected due to demand ischemia, no planned further workup, not on statin due to h/o intolerance  6. 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 Chronic diastolic CHF due to hypertensive heart disease, primarily RHF, suspect some component of volume retention due to hypoalbuminemia  8. HTN  9. PVD s/p AAA/iliac aneurysm repair 01/2013  10. + FOBT yesterday?  Pt remains in AF with rates 80s-100s.  Pt is now hypertensive at 158-199/56-80.  Cr 1.56, BUN 48, trending down.  Baseline Cr is around 2.  He is beginning to have more scrotal edema. Will increase Lasix to 60mg  BID and follow. Consider increasing Dilt to 180mg  for improved rate control/BP.  SignedDahlia Bailiff, PA-S 08/19/13 1234  Pt seen/examined with student. Changes made above. Dayna Dunn PA-C Agree with assessment and plan above. Lasix has been increased to 60 mg BID. Will also increase diltiazem to 180 mg daily for better rate control and BP control. Will need to follow renal function closely at [facility]. He has developed worsening renal function in past with aggressive diuresis for his scrotal edema.

## 2013-08-19 NOTE — Progress Notes (Signed)
Name: Evan Perez MRN: [id number] DOB: Apr 17, 1923    ADMISSION DATE:  08/12/2013 CONSULTATION DATE:  08/14/13  REFERRING MD :  Trauma PRIMARY SERVICE:  Trauma  CHIEF COMPLAINT:  Pleural Effusion  BRIEF PATIENT DESCRIPTION: 80 y.o. M presented to [facility] ED on 3/11 after sustained mechanical fall while using restroom.  Workup revealed multiple rib fx's, multiple acute vertebral compression fx's, R PTX, R hemothorax. Chest tube placed by Trauma.  PCCM called 3/13 to evaluate R effusion and small L effusion. Performed thoracentesis c/w traumatic effusion. HAs begun to reaccumulate. PCCM asked to see.    SIGNIFICANT EVENTS / STUDIES:  3/11 - fell at home, presented to ED, workup revealed multiple rib fx's, PTX, pleural effusions. 3/13 - Hgb drop to 6.6, transfused 1 u PRBC.  LINES / TUBES: R Chest Tube 3/11 >>>  CULTURES: Pleural fluid 3/13 >>> c/w traumatic effusion  ANTIBIOTICS: None  SUBJECTIVE:   Right chest pain worse with deep inspiration, he feels as though this pain has been persistent, and is not correlated with the amount of pleural fluid there is. Pain has no radiation. He also states that his breathing is not at baseline, it is somewhat limited by pain. He has adequate SpO2 on 1.5L/min via nasal cannula.   VITAL SIGNS: Temp:  [97.4 F (36.3 C)-98.2 F (36.8 C)] 97.5 F (36.4 C) (03/18 0505) Pulse Rate:  [83-102] 83 (03/18 0505) Resp:  [18-19] 18 (03/18 0505) BP: (158-199)/(56-82) 162/82 mmHg (03/18 0505) SpO2:  [95 %-98 %] 98 % (03/18 0505) Weight:  [74.125 kg (163 lb 6.7 oz)] 74.125 kg (163 lb 6.7 oz) (03/18 0505)  PHYSICAL EXAMINATION: General: Pleasant elderly male, resting in bed, in NAD. Neuro: A&O x 3, non-focal.  HEENT: [place]/AT. PERRL Cardiovascular: IRIR, SEM Lungs: Respirations unlabored, Diminished breath sounds halfway up right posterior chest.  Abdomen: BS x 4, soft, NT/ND.  Rib/back brace in place. Musculoskeletal: No gross deformities, no edema.    Skin: Intact, warm.    Recent Labs Lab 08/15/13 0320 08/17/13 0853 08/18/13 0317  NA 132* 131* 132*  K 3.7 4.2 4.2  CL 92* 91* 92*  CO2 29 31 29   BUN 87* 51* 48*  CREATININE 1.82* 1.54* 1.56*  GLUCOSE 107* 88 93    Recent Labs Lab 08/17/13 0853 08/18/13 0317 08/19/13 0427  HGB 8.6* 8.0* 8.2*  HCT 26.4* 25.1* 25.4*  WBC 10.3 10.5 10.0  PLT 136* 155 177    CXR - bilateral effusion - R > L - R effusion with progressive increase in size from 3/14 > 3/18  ASSESSMENT / PLAN:  Right PTX - s/p chest tube, improving, small residual remains. Pleural Effusion, R > L - s/p chest tube and thoracentesis 3/13. Multiple rib fx's - s/p fall. Chronic Diastolic CHF - clinical picture and CXR findings consistent with CHF  Plan: - Recommend aggressive diuresis  - Cardiology following - Ok to discharge to SNF - F/u CXR on Friday 3/20 or sooner if changes clinically.       Georgann Housekeeper, ACNP [facility] Pager [phone] or [phone]  Pleural effusion worsening on CXR but anticipate will improve with aggressive diureses.  May d/c if patient wishes to as long as being aggressively diuresed.  Anticipate the pleural effusion is related to heart failure given the fact that it is bilateral.  No indication for a thoracentesis at this time.  Arrange for F/U on Friday with a repeat CXR and potentially an outpatient thora if worsening.  PCCM will be available PRN.  Patient seen and examined, agree with above note.  I dictated the care and orders written for this patient under my direction.  Rush Farmer, MD [phone]

## 2013-08-20 MED ORDER — TRAMADOL HCL 50 MG PO TABS: 50.0000 mg | ORAL_TABLET | Freq: Four times a day (QID) | ORAL | Status: AC | PRN

## 2013-08-20 MED ORDER — DILTIAZEM HCL ER COATED BEADS 120 MG PO CP24: 120.0000 mg | ORAL_CAPSULE | Freq: Every day | ORAL | Status: AC

## 2013-08-20 NOTE — Progress Notes (Signed)
Patient ID: Evan Perez, male   DOB: 08-11-1922, 80 y.o.   MRN: [id number]   LOS: 8 days   Subjective: No new c/o.   Objective: Vital signs in last 24 hours: Temp:  [97.4 F (36.3 C)-98.5 F (36.9 C)] 98.5 F (36.9 C) (03/19 0455) Pulse Rate:  [84-98] 87 (03/19 0455) Resp:  [18-20] 19 (03/19 0455) BP: (163-170)/(68-82) 165/68 mmHg (03/19 0455) SpO2:  [94 %-98 %] 94 % (03/19 0455) Weight:  [163 lb 9.3 oz (74.2 kg)] 163 lb 9.3 oz (74.2 kg) (03/19 0455) Last BM Date: 08/19/13   Physical Exam General appearance: alert and no distress Resp: wheezes bilaterally Cardio: irregularly irregular rhythm GI: normal findings: bowel sounds normal and soft, non-tender   Assessment/Plan: Fall  Multiple right rib fxs w/HTX s/p CT -- Appreciate pulmonology eval L2-4 compression fxs -- Lumbar corset  Afib w/RVR and elevated troponins -- Appreciate cardiology consult  Acute on chronic anemia -- Stable  Multiple medical problems -- Stable, on home meds except coumadin which is not to be continued due to fall risk per cardiology  Dispo -- Transfer to SNF    Lisette Abu, PA-C Pager: [phone] General Trauma PA Pager: [phone]  08/20/2013

## 2013-08-20 NOTE — Clinical Social Work Note (Signed)
Clinical Social Worker facilitated patient discharge including contacting patient and facility to confirm patient discharge plans.  Clinical information faxed to facility and patient agreeable with plan.  CSW arranged ambulance transport via [facility] to [facility].  CSW contacted [facility] regarding possible transportation, however they were unavailable to provide transportation today - patient aware and agreeable with ambulance transport.  RN to call report prior to discharge.  PA to contact patient daughter in law, Danton Clap, regarding concerns for patient discharge.  Patient alert and oriented x4 and has notified family of plans for discharge today.  Clinical Social Worker will sign off for now as social work intervention is no longer needed. Please consult [place] again if new need arises.  Barbette Or, [place]

## 2013-08-20 NOTE — Progress Notes (Signed)
Ready for transfer to [facility]  This patient has been seen and I agree with the findings and treatment plan.  Kathryne Eriksson. Dahlia Bailiff, MD, [place] [phone] (pager) [phone] (direct pager) Trauma Surgeon

## 2013-08-20 NOTE — Progress Notes (Signed)
Conversation with patient in room who states he really wants to stay here because he will get better service.  Patient is mostly concerned about being able to keep his condom cath so he will not have to use the urinal.  He states he can not use his urinal back at the facility because he has carpeted floor and it will spill and make a mess.  He states he will call the urologist himself and have them come and put a foley in his bladder.  I told him this could lead to an infection needlessly.  Patient continues to state he needs to stay here.

## 2013-08-20 NOTE — Discharge Summary (Signed)
Physician Discharge Summary  Patient ID: Evan Perez MRN: [id number] DOB/AGE: 10-May-1923 80 y.o.  Admit date: 08/12/2013 Discharge date: 08/20/2013  Discharge Diagnoses Patient Active Problem List   Diagnosis Date Noted  . Pleural effusion 08/14/2013  . Hemothorax on right 08/12/2013  . Fall from standing 08/12/2013  . Anemia 08/12/2013  . Elevated troponin- suspected Type 2 MI 08/12/2013  . Hypertensive heart disease 08/12/2013  . Multiple fractures of ribs of right side 08/12/2013  . Chronic diastolic CHF (congestive heart failure)   . CKD (chronic kidney disease), stage III   . AAA (abdominal aortic aneurysm) s/p endovascular repair 01/2013   . Hypertension   . CAD (coronary artery disease) - nonobstructive by cath 2011   . Edema 04/20/2013  . Retroperitoneal lymphadenopathy 04/13/2013  . Peripheral edema 02/11/2013  . Constipation 02/11/2013  . Chronic anticoagulation 01/26/2013  . Occlusion and stenosis of carotid artery without mention of cerebral infarction 12/29/2012  . Abdominal aneurysm without mention of rupture 12/15/2012  . Perennial allergic rhinitis 03/13/2011  . Drug-induced skin rash 09/12/2010  . Severe AS s/p bovine tissue AVR 2011 11/01/2009  . BACK PAIN 10/03/2009  . POLYMYALGIA RHEUMATICA 04/01/2009  . ERECTILE DYSFUNCTION, ORGANIC 10/26/2008  . GOUT 03/10/2007  . HYPERLIPIDEMIA 12/02/2006  . Chronic atrial fibrillation- rapid AF on admission 08/12/13 12/02/2006  . BENIGN PROSTATIC HYPERTROPHY 12/02/2006    Consultants Dr. Candee Furbish for cardiology  Dr. Karie Chimera for neurology  Dr. Jennet Maduro for pulmonology   Procedures Right tube thoracostomy by Dr. Erroll Luna   HPI: Rohnan got up from the toilet after urinating and fell, landing on his right side. He denied syncope, presyncope, dizziness, or loss of consciousness. He was unable to get up and lied on the floor for ~4 hours. On arrival he was in atrial fibrillation (chronically)  but was having a rapid ventricular response and his troponin was elevated. Cardiology was consulted. His workup included CT scans of the chest, abdomen, and pelvis which showed multiple right rib fractures with a moderate effusion and some new thoracic and lumbar compression fractures. It was assumed that the effusion was a hemothorax and was expected to worsen given his anticoagulation on Coumadin and so a chest tube was placed. This was done prior to the radiologist interpretation of the scan that showed the effusion to be less dense than blood. He was given 2 units of fresh frozen plasma to help with his coagulopathy and admitted to the stepdown unit by the trauma service.    Hospital Course: Neurosurgery was consulted the following day and recommended a lumbar corset to treat his lumbar compression fractures. Cardiology was able to rate control his ventricular response relatively easily. Given the history of falls (this was not his first) they recommended no further anticoagulation despite the artificial (bovine) valve and atrial fibrillation as the risk associated outweighed the benefit. The fluid evacuated from this thoracic cavity was mostly serous and he continued to have high output during the time the chest tube was in. Given the fact that the serous fluid appeared to be present before the chest tube was placed we sent the fluid for analysis that suggested an exudative process. Pulmonology was consulted and agreed but thought that the fluid was a response to the trauma. The chest tube was able to be removed. He redeveloped a small-to-moderate pleural effusion and pulmonology was asked to re-evaluate. At this point they thought that it was mainly due to heart failure and recommended aggressive diuresis with  close outpatient follow-up. He was then discharged to the skilled nursing facility associated with his independent living facility in stable condition.      Medication List    STOP taking these  medications       JANTOVEN 5 MG tablet  Generic drug:  warfarin      TAKE these medications       aspirin 81 MG tablet  Take 81 mg by mouth every Monday, Wednesday, and Friday.     azelastine 137 MCG/SPRAY nasal spray  Commonly known as:  ASTELIN  Place 1 spray into the nose 2 (two) times daily as needed. Use in each nostril as directed     betamethasone valerate 0.1 % cream  Commonly known as:  VALISONE  Apply 1 application topically 3 (three) times a week.     cloNIDine 0.1 MG tablet  Commonly known as:  CATAPRES  Take 0.2 mg by mouth 2 (two) times daily.     diltiazem 120 MG 24 hr capsule  Commonly known as:  CARDIZEM CD  Take 1 capsule (120 mg total) by mouth daily.     furosemide 40 MG tablet  Commonly known as:  LASIX  Take 120 mg by mouth 2 (two) times daily.     guaiFENesin 600 MG 12 hr tablet  Commonly known as:  MUCINEX  Take 600 mg by mouth 2 (two) times daily as needed for congestion. For congestion.     hydrALAZINE 25 MG tablet  Commonly known as:  APRESOLINE  Take 25 mg by mouth daily.     levothyroxine 25 MCG tablet  Commonly known as:  SYNTHROID, LEVOTHROID  Take 25 mcg by mouth daily before breakfast.     lisinopril 10 MG tablet  Commonly known as:  PRINIVIL,ZESTRIL  Take 10 mg by mouth daily.     MULTIVITAMIN/IRON PO  Take 1 tablet by mouth daily.     OVER THE COUNTER MEDICATION  Place 1 spray into both nostrils daily as needed (for allergies). neilmed nasal wash     traMADol 50 MG tablet  Commonly known as:  ULTRAM  Take 1-2 tablets (50-100 mg total) by mouth every 6 (six) hours as needed (Pain).             Follow-up Information   Follow up with [facility]. Schedule an appointment as soon as possible for a visit on 08/21/2013.   Contact information:   [place] [address]       Call [facility]. (As needed)    Contact information:   [address] [place]  [address] [phone]       Discharge planning took greater than 30 minutes.    Signed: Lisette Abu, PA-C Pager: [phone] General Trauma PA Pager: [phone] 08/20/2013, 7:41 AM

## 2013-08-20 NOTE — Progress Notes (Signed)
Received phone call from dtr in law alice stating she had received a call from Mr. Applegate who is concerned about being discharged and getting a ride to the facility.  Dtr in law also stated she was concerned patient was not ready for discharge, I told her that I would have trauma surgeon call her to discuss patients condition.  She stated patient was very concerned about his "catheter", I informed her that he was wearing a condom cath for his convenience only and that I had encouraged him to remove that and use the urinal.  Patient refused to do this. I told her this was not a catheter indwelling his bladder.  Did give her information to Silvestre Gunner, [place].

## 2013-08-21 ENCOUNTER — Ambulatory Visit (INDEPENDENT_AMBULATORY_CARE_PROVIDER_SITE_OTHER): Payer: Medicare Other | Admitting: Internal Medicine

## 2013-08-21 ENCOUNTER — Encounter: Payer: Self-pay | Admitting: Internal Medicine

## 2013-08-21 ENCOUNTER — Encounter (INDEPENDENT_AMBULATORY_CARE_PROVIDER_SITE_OTHER): Payer: Self-pay

## 2013-08-21 VITALS — BP 136/60 | HR 84 | Temp 97.9°F

## 2013-08-21 DIAGNOSIS — J90 Pleural effusion, not elsewhere classified: Secondary | ICD-10-CM

## 2013-08-21 DIAGNOSIS — I2510 Atherosclerotic heart disease of native coronary artery without angina pectoris: Secondary | ICD-10-CM

## 2013-08-21 DIAGNOSIS — J9610 Chronic respiratory failure, unspecified whether with hypoxia or hypercapnia: Secondary | ICD-10-CM

## 2013-08-21 DIAGNOSIS — J942 Hemothorax: Secondary | ICD-10-CM

## 2013-08-21 DIAGNOSIS — I10 Essential (primary) hypertension: Secondary | ICD-10-CM

## 2013-08-21 MED ORDER — VALSARTAN 80 MG PO TABS: 80.0000 mg | ORAL_TABLET | Freq: Every day | ORAL | Status: AC

## 2013-08-21 NOTE — Patient Instructions (Signed)
If the breathing is the same lying back in your usual position then we will see you on Tuesday 08/25/13 when you see Dr Raliegh Ip - however if breathing gets worse in meantime you'll need to go to ER  Stop lisinopril (due to cough) and start diovan 80 mg one daily

## 2013-08-22 DIAGNOSIS — J9610 Chronic respiratory failure, unspecified whether with hypoxia or hypercapnia: Secondary | ICD-10-CM | POA: Insufficient documentation

## 2013-08-22 NOTE — Assessment & Plan Note (Addendum)
ACE inhibitors are problematic in  pts with airway complaints because  even experienced pulmonologists can't always distinguish ace effects from copd/asthma.  By themselves they don't actually cause a problem, much like oxygen can't by itself start a fire, but they certainly serve as a powerful catalyst or enhancer for any "fire"  or inflammatory process in the upper airway, be it caused by an ET  tube or more commonly reflux (especially in the obese or pts with known GERD or who are on biphoshonates).    In the era of ARB near equivalency until we have a better handle on the reversibility of the airway problem, it just makes sense to avoid ACEI  entirely in the short run and then decide later, having established a level of airway control using a reasonable limited regimen, whether to add back ace but even then being very careful to observe the pt for worsening airway control and number of meds used/ needed to control symptoms.    His chronic dry cough that preceded the fall is likely due to acei use but is aggravating his cp sp fall so best to substitute arb at least until he recovers from his musculoskelatal injuries.   rec diovan 80 mg daily/ note ef ok 05/2013

## 2013-08-22 NOTE — Progress Notes (Signed)
Subjective:     Patient ID: Evan Perez, male   DOB: May 21, 1923, 80 y.o.   MRN: [id number]  HPI   23 yowm referred to pulmonary clinic from SNF 08/21/13 sp hosp admit p falling  Admit date: 08/12/2013  Discharge date: 08/20/2013  Discharge Diagnoses  Patient Active Problem List    Diagnosis  Date Noted   .  Pleural effusion  08/14/2013   .  Hemothorax on right  08/12/2013   .  Fall from standing  08/12/2013   .  Anemia  08/12/2013   .  Elevated troponin- suspected Type 2 MI  08/12/2013   .  Hypertensive heart disease  08/12/2013   .  Multiple fractures of ribs of right side  08/12/2013   .  Chronic diastolic CHF (congestive heart failure)    .  CKD (chronic kidney disease), stage III    .  AAA (abdominal aortic aneurysm) s/p endovascular repair 01/2013    .  Hypertension    .  CAD (coronary artery disease) - nonobstructive by cath 2011    .  Edema  04/20/2013   .  Retroperitoneal lymphadenopathy  04/13/2013   .  Peripheral edema  02/11/2013   .  Constipation  02/11/2013   .  Chronic anticoagulation  01/26/2013   .  Occlusion and stenosis of carotid artery without mention of cerebral infarction  12/29/2012   .  Abdominal aneurysm without mention of rupture  12/15/2012   .  Perennial allergic rhinitis  03/13/2011   .  Drug-induced skin rash  09/12/2010   .  Severe AS s/p bovine tissue AVR 2011  11/01/2009   .  BACK PAIN  10/03/2009   .  POLYMYALGIA RHEUMATICA  04/01/2009   .  ERECTILE DYSFUNCTION, ORGANIC  10/26/2008   .  GOUT  03/10/2007   .  HYPERLIPIDEMIA  12/02/2006   .  Chronic atrial fibrillation- rapid AF on admission 08/12/13  12/02/2006   .  BENIGN PROSTATIC HYPERTROPHY  12/02/2006   Consultants  Dr. Candee Furbish for cardiology  Dr. Karie Chimera for neurology  Dr. Jennet Maduro for pulmonology  Procedures  Right tube thoracostomy by Dr. Erroll Luna  HPI: Evan Perez got up from the toilet after urinating and fell, landing on his right side. He denied syncope,  presyncope, dizziness, or loss of consciousness. He was unable to get up and lied on the floor for ~4 hours. On arrival he was in atrial fibrillation (chronically) but was having a rapid ventricular response and his troponin was elevated. Cardiology was consulted. His workup included CT scans of the chest, abdomen, and pelvis which showed multiple right rib fractures with a moderate effusion and some new thoracic and lumbar compression fractures. It was assumed that the effusion was a hemothorax and was expected to worsen given his anticoagulation on Coumadin and so a chest tube was placed. This was done prior to the radiologist interpretation of the scan that showed the effusion to be less dense than blood. He was given 2 units of fresh frozen plasma to help with his coagulopathy and admitted to the stepdown unit by the trauma service.  Hospital Course: Neurosurgery was consulted the following day and recommended a lumbar corset to treat his lumbar compression fractures. Cardiology was able to rate control his ventricular response relatively easily. Given the history of falls (this was not his first) they recommended no further anticoagulation despite the artificial (bovine) valve and atrial fibrillation as the risk associated outweighed the benefit.  The fluid evacuated from this thoracic cavity was mostly serous and he continued to have high output during the time the chest tube was in. Given the fact that the serous fluid appeared to be present before the chest tube was placed we sent the fluid for analysis that suggested an exudative process. Pulmonology was consulted and agreed but thought that the fluid was a response to the trauma. The chest tube was able to be removed. He redeveloped a small-to-moderate pleural effusion and pulmonology was asked to re-evaluate. At this point they thought that it was mainly due to heart failure and recommended aggressive diuresis with close outpatient follow-up. He was then  discharged to the skilled nursing facility associated with his independent living facility in stable condition.    08/21/2013 f/u ov/Evan Perez re: post hosp f/u on ACEi  Chief Complaint  Patient presents with  . HFU    Pt reports his breathing is unchanged since hospital d/c.  He denies any new co's today.   Main ongoing issue is chronic dry cough that preceded the falls. Able to lie as flat as baseline = 2 pillows, gen chest and back pain with cough ok control, still on 02   No obvious day to day or daytime variabilty or assoc chronic cough or cp or chest tightness, subjective wheeze overt sinus or hb symptoms. No unusual exp hx or h/o childhood pna/ asthma or knowledge of premature birth.  Sleeping ok without nocturnal  or early am exacerbation  of respiratory  c/o's or need for noct saba. Also denies any obvious fluctuation of symptoms with weather or environmental changes or other aggravating or alleviating factors except as outlined above   Current Medications, Allergies, Complete Past Medical History, Past Surgical History, Family History, and Social History were reviewed in [employer] record.  ROS  The following are not active complaints unless bolded sore throat, dysphagia, dental problems, itching, sneezing,  nasal congestion or excess/ purulent secretions, ear ache,   fever, chills, sweats, unintended wt loss, pleuritic or exertional cp, hemoptysis,  orthopnea pnd or leg swelling, presyncope, palpitations, heartburn, abdominal pain, anorexia, nausea, vomiting, diarrhea  or change in bowel or urinary habits, change in stools or urine, dysuria,hematuria,  rash, arthralgias, visual complaints, headache, numbness weakness or ataxia or problems with walking or coordination,  change in mood/affect or memory.        Review of Systems     Objective:   Physical Exam W/c bound wm very frail, with large corset over chest/ nad at rest on 02   Wt Readings from Last 3  Encounters:  08/20/13 163 lb 9.3 oz (74.2 kg)  07/29/13 179 lb (81.194 kg)  07/07/13 165 lb (74.844 kg)      HEENT: nl dentition, turbinates, and orophanx. Nl external ear canals without cough reflex   NECK :  without JVD/Nodes/TM/ nl carotid upstrokes bilaterally   LUNGS: no acc muscle use,  Decreased bs both bases, coarse bs no wheezing    CV:  RRR  no s3 or murmur or increase in P2, no edema   ABD:  soft and nontender with nl excursion in the supine position. No bruits or organomegaly, bowel sounds nl  MS:  warm without deformities, calf tenderness, cyanosis or clubbing  SKIN: warm and dry without lesions    NEURO:  alert, approp, no deficits  cxr 08/18/13 Persistent bilateral pleural effusion with bilateral basilar  atelectasis or infiltrate. Small amount of fluid in right minor  fissure. No  convincing pulmonary edema.   Decub 08/19/13  Only small R effusion       Assessment:

## 2013-08-22 NOTE — Assessment & Plan Note (Signed)
Onset 08/2013 p traumatic hemothorax from falling   Adequate control on present rx, reviewed > no change in rx needed

## 2013-08-22 NOTE — Assessment & Plan Note (Signed)
S/p fall and R Chest tube which was removed prior to d/c with good BS bilaterally and baseline "orthopnea" but now 02 dep   Recent Labs Lab 08/17/13 0853 08/18/13 0317 08/19/13 0427  HGB 8.6* 8.0* 8.2*    Needs f/u cbc and cxr planned for 08/25/13 and back to hosp in meantime if breathing worse.

## 2013-08-24 ENCOUNTER — Inpatient Hospital Stay (HOSPITAL_COMMUNITY)
Admission: EM | Admit: 2013-08-24 | Discharge: 2013-09-02 | DRG: 199 | Disposition: E | Payer: Medicare Other | Attending: Critical Care Medicine | Admitting: Critical Care Medicine

## 2013-08-24 ENCOUNTER — Encounter (HOSPITAL_COMMUNITY): Payer: Self-pay | Admitting: Emergency Medicine

## 2013-08-24 ENCOUNTER — Telehealth: Payer: Self-pay | Admitting: Internal Medicine

## 2013-08-24 ENCOUNTER — Emergency Department (HOSPITAL_COMMUNITY): Payer: Medicare Other

## 2013-08-24 DIAGNOSIS — I4891 Unspecified atrial fibrillation: Secondary | ICD-10-CM | POA: Diagnosis present

## 2013-08-24 DIAGNOSIS — Z66 Do not resuscitate: Secondary | ICD-10-CM

## 2013-08-24 DIAGNOSIS — R0689 Other abnormalities of breathing: Secondary | ICD-10-CM

## 2013-08-24 DIAGNOSIS — I482 Chronic atrial fibrillation, unspecified: Secondary | ICD-10-CM | POA: Diagnosis present

## 2013-08-24 DIAGNOSIS — Z515 Encounter for palliative care: Secondary | ICD-10-CM

## 2013-08-24 DIAGNOSIS — I5033 Acute on chronic diastolic (congestive) heart failure: Secondary | ICD-10-CM | POA: Diagnosis present

## 2013-08-24 DIAGNOSIS — J942 Hemothorax: Secondary | ICD-10-CM | POA: Diagnosis present

## 2013-08-24 DIAGNOSIS — J9620 Acute and chronic respiratory failure, unspecified whether with hypoxia or hypercapnia: Secondary | ICD-10-CM | POA: Diagnosis present

## 2013-08-24 DIAGNOSIS — Z7982 Long term (current) use of aspirin: Secondary | ICD-10-CM

## 2013-08-24 DIAGNOSIS — Z8249 Family history of ischemic heart disease and other diseases of the circulatory system: Secondary | ICD-10-CM

## 2013-08-24 DIAGNOSIS — I079 Rheumatic tricuspid valve disease, unspecified: Secondary | ICD-10-CM | POA: Diagnosis present

## 2013-08-24 DIAGNOSIS — W19XXXA Unspecified fall, initial encounter: Secondary | ICD-10-CM | POA: Diagnosis present

## 2013-08-24 DIAGNOSIS — Y92009 Unspecified place in unspecified non-institutional (private) residence as the place of occurrence of the external cause: Secondary | ICD-10-CM

## 2013-08-24 DIAGNOSIS — Z954 Presence of other heart-valve replacement: Secondary | ICD-10-CM

## 2013-08-24 DIAGNOSIS — I509 Heart failure, unspecified: Secondary | ICD-10-CM

## 2013-08-24 DIAGNOSIS — N19 Unspecified kidney failure: Secondary | ICD-10-CM

## 2013-08-24 DIAGNOSIS — Z9181 History of falling: Secondary | ICD-10-CM

## 2013-08-24 DIAGNOSIS — R748 Abnormal levels of other serum enzymes: Secondary | ICD-10-CM

## 2013-08-24 DIAGNOSIS — E785 Hyperlipidemia, unspecified: Secondary | ICD-10-CM | POA: Diagnosis present

## 2013-08-24 DIAGNOSIS — R0600 Dyspnea, unspecified: Secondary | ICD-10-CM

## 2013-08-24 DIAGNOSIS — L8995 Pressure ulcer of unspecified site, unstageable: Secondary | ICD-10-CM | POA: Diagnosis present

## 2013-08-24 DIAGNOSIS — R404 Transient alteration of awareness: Secondary | ICD-10-CM | POA: Diagnosis not present

## 2013-08-24 DIAGNOSIS — T502X5A Adverse effect of carbonic-anhydrase inhibitors, benzothiadiazides and other diuretics, initial encounter: Secondary | ICD-10-CM | POA: Diagnosis not present

## 2013-08-24 DIAGNOSIS — Z7901 Long term (current) use of anticoagulants: Secondary | ICD-10-CM

## 2013-08-24 DIAGNOSIS — D649 Anemia, unspecified: Secondary | ICD-10-CM | POA: Diagnosis present

## 2013-08-24 DIAGNOSIS — N179 Acute kidney failure, unspecified: Secondary | ICD-10-CM | POA: Diagnosis present

## 2013-08-24 DIAGNOSIS — S32009A Unspecified fracture of unspecified lumbar vertebra, initial encounter for closed fracture: Secondary | ICD-10-CM | POA: Diagnosis present

## 2013-08-24 DIAGNOSIS — L89609 Pressure ulcer of unspecified heel, unspecified stage: Secondary | ICD-10-CM | POA: Diagnosis present

## 2013-08-24 DIAGNOSIS — S271XXA Traumatic hemothorax, initial encounter: Principal | ICD-10-CM | POA: Diagnosis present

## 2013-08-24 DIAGNOSIS — J9610 Chronic respiratory failure, unspecified whether with hypoxia or hypercapnia: Secondary | ICD-10-CM | POA: Diagnosis present

## 2013-08-24 DIAGNOSIS — J90 Pleural effusion, not elsewhere classified: Secondary | ICD-10-CM

## 2013-08-24 DIAGNOSIS — N183 Chronic kidney disease, stage 3 unspecified: Secondary | ICD-10-CM | POA: Diagnosis present

## 2013-08-24 DIAGNOSIS — I2510 Atherosclerotic heart disease of native coronary artery without angina pectoris: Secondary | ICD-10-CM | POA: Diagnosis present

## 2013-08-24 DIAGNOSIS — R410 Disorientation, unspecified: Secondary | ICD-10-CM

## 2013-08-24 DIAGNOSIS — Z87891 Personal history of nicotine dependence: Secondary | ICD-10-CM

## 2013-08-24 DIAGNOSIS — I129 Hypertensive chronic kidney disease with stage 1 through stage 4 chronic kidney disease, or unspecified chronic kidney disease: Secondary | ICD-10-CM | POA: Diagnosis present

## 2013-08-24 LAB — BASIC METABOLIC PANEL
BUN: 55 mg/dL — ABNORMAL HIGH (ref 6–23)
CO2: 36 mEq/L — ABNORMAL HIGH (ref 19–32)
Calcium: 8.4 mg/dL (ref 8.4–10.5)
Creatinine, Ser: 2.43 mg/dL — ABNORMAL HIGH (ref 0.50–1.35)
GFR calc Af Amer: 26 mL/min — ABNORMAL LOW (ref >90)
GFR calc non Af Amer: 22 mL/min — ABNORMAL LOW (ref >90)
Glucose, Bld: 99 mg/dL (ref 70–99)
Potassium: 4.2 mEq/L (ref 3.7–5.3)

## 2013-08-24 LAB — BASIC METABOLIC PANEL WITH GFR
Chloride: 92 meq/L — ABNORMAL LOW (ref 96–112)
Sodium: 137 meq/L (ref 137–147)

## 2013-08-24 LAB — CK: Total CK: 55 U/L (ref 7–232)

## 2013-08-24 LAB — I-STAT VENOUS BLOOD GAS, ED
Acid-Base Excess: 11 mmol/L — ABNORMAL HIGH (ref 0.0–2.0)
Bicarbonate: 39.8 mEq/L — ABNORMAL HIGH (ref 20.0–24.0)
O2 Saturation: 59 %
TCO2: 42 mmol/L (ref 0–100)
pCO2, Ven: 82.7 mmHg (ref 45.0–50.0)
pH, Ven: 7.291 (ref 7.250–7.300)
pO2, Ven: 37 mmHg (ref 30.0–45.0)

## 2013-08-24 LAB — CBC WITH DIFFERENTIAL/PLATELET
Basophils Absolute: 0.1 K/uL (ref 0.0–0.1)
Basophils Relative: 1 % (ref 0–1)
Eosinophils Absolute: 0.3 10*3/uL (ref 0.0–0.7)
Eosinophils Relative: 3 % (ref 0–5)
HCT: 27.6 % — ABNORMAL LOW (ref 39.0–52.0)
Hemoglobin: 8.4 g/dL — ABNORMAL LOW (ref 13.0–17.0)
Lymphocytes Relative: 13 % (ref 12–46)
Lymphs Abs: 1.2 10*3/uL (ref 0.7–4.0)
MCH: 28.5 pg (ref 26.0–34.0)
MCHC: 30.4 g/dL (ref 30.0–36.0)
MCV: 93.6 fL (ref 78.0–100.0)
Monocytes Absolute: 0.6 K/uL (ref 0.1–1.0)
Monocytes Relative: 6 % (ref 3–12)
Neutro Abs: 7.1 K/uL (ref 1.7–7.7)
Neutrophils Relative %: 77 % (ref 43–77)
Platelets: 290 K/uL (ref 150–400)
RBC: 2.95 MIL/uL — ABNORMAL LOW (ref 4.22–5.81)
RDW: 19.2 % — ABNORMAL HIGH (ref 11.5–15.5)
WBC: 9.2 K/uL (ref 4.0–10.5)

## 2013-08-24 LAB — APTT: aPTT: 34 seconds (ref 24–37)

## 2013-08-24 LAB — URINALYSIS, ROUTINE W REFLEX MICROSCOPIC
Bilirubin Urine: NEGATIVE
Glucose, UA: NEGATIVE mg/dL
Hgb urine dipstick: NEGATIVE
Ketones, ur: NEGATIVE mg/dL
Leukocytes, UA: NEGATIVE
Nitrite: NEGATIVE
Protein, ur: NEGATIVE mg/dL
Specific Gravity, Urine: 1.017 (ref 1.005–1.030)
Urobilinogen, UA: 0.2 mg/dL (ref 0.0–1.0)
pH: 5 (ref 5.0–8.0)

## 2013-08-24 LAB — OTHER BODY FLUID CHEMISTRY

## 2013-08-24 LAB — GLUCOSE, CAPILLARY
GLUCOSE-CAPILLARY: 110 mg/dL — AB (ref 70–99)
GLUCOSE-CAPILLARY: 26 mg/dL — AB (ref 70–99)
Glucose-Capillary: 51 mg/dL — ABNORMAL LOW (ref 70–99)

## 2013-08-24 LAB — SAMPLE TO BLOOD BANK

## 2013-08-24 LAB — PROTIME-INR
INR: 1.1 (ref 0.00–1.49)
Prothrombin Time: 14 s (ref 11.6–15.2)

## 2013-08-24 LAB — TROPONIN I: Troponin I: 0.5 ng/mL (ref <0.30)

## 2013-08-24 LAB — PRO B NATRIURETIC PEPTIDE: Pro B Natriuretic peptide (BNP): 12886 pg/mL — ABNORMAL HIGH (ref 0–450)

## 2013-08-24 MED ORDER — DEXTROSE 50 % IV SOLN: 25.0000 mL | Freq: Once | INTRAVENOUS | Status: AC

## 2013-08-24 MED ORDER — IRBESARTAN 150 MG PO TABS
150.0000 mg | ORAL_TABLET | Freq: Every day | ORAL | Status: DC
  Administered 2013-08-24: 150 mg via ORAL
  Filled 2013-08-24 (×2): qty 1

## 2013-08-24 MED ORDER — SODIUM CHLORIDE 0.9 % IV SOLN
INTRAVENOUS | Status: DC
  Administered 2013-08-24: 10 mL/h via INTRAVENOUS
  Administered 2013-08-25 – 2013-08-26 (×2): via INTRAVENOUS

## 2013-08-24 MED ORDER — FUROSEMIDE 10 MG/ML IJ SOLN
40.0000 mg | Freq: Three times a day (TID) | INTRAMUSCULAR | Status: DC
Start: 2013-08-24 — End: 2013-08-25
  Filled 2013-08-24 (×3): qty 4

## 2013-08-24 MED ORDER — TRAMADOL HCL 50 MG PO TABS
50.0000 mg | ORAL_TABLET | Freq: Four times a day (QID) | ORAL | Status: DC | PRN
  Administered 2013-08-25 (×2): 50 mg via ORAL
  Filled 2013-08-24 (×3): qty 1

## 2013-08-24 MED ORDER — ASPIRIN 81 MG PO TABS: 81.0000 mg | ORAL_TABLET | ORAL | Status: DC

## 2013-08-24 MED ORDER — FUROSEMIDE 10 MG/ML IJ SOLN
40.0000 mg | Freq: Once | INTRAMUSCULAR | Status: AC
  Administered 2013-08-24: 40 mg via INTRAVENOUS
  Filled 2013-08-24: qty 4

## 2013-08-24 MED ORDER — DILTIAZEM HCL ER COATED BEADS 120 MG PO CP24
120.0000 mg | ORAL_CAPSULE | Freq: Every day | ORAL | Status: DC
  Administered 2013-08-24 – 2013-08-25 (×2): 120 mg via ORAL
  Filled 2013-08-24 (×3): qty 1

## 2013-08-24 MED ORDER — ASPIRIN EC 81 MG PO TBEC
81.0000 mg | DELAYED_RELEASE_TABLET | ORAL | Status: DC
  Filled 2013-08-24 (×2): qty 1

## 2013-08-24 MED ORDER — HYDRALAZINE HCL 25 MG PO TABS
25.0000 mg | ORAL_TABLET | Freq: Every day | ORAL | Status: DC
  Administered 2013-08-24 – 2013-08-25 (×2): 25 mg via ORAL
  Filled 2013-08-24 (×2): qty 1

## 2013-08-24 MED ORDER — LEVOTHYROXINE SODIUM 25 MCG PO TABS
25.0000 ug | ORAL_TABLET | Freq: Every day | ORAL | Status: DC
  Filled 2013-08-24 (×3): qty 1

## 2013-08-24 NOTE — Telephone Encounter (Signed)
Son would like you to call him prior to pt's appt tomorrow at 1 pm to address concerns. He may not be available to come to appt. Hippa located in old system for Evan Perez , printed and will have it transferred to new sysem, ok to speak w/ him.

## 2013-08-24 NOTE — ED Notes (Signed)
Kelly RT at bedside adjusting mask.

## 2013-08-24 NOTE — Progress Notes (Signed)
eLink Physician-Brief Progress Note Patient Name: Evan Perez DOB: Sep 04, 1922 MRN: [id number]  Date of Service  14-Sep-2013   HPI/Events of Note   Pt admitted from ED to ICU with acute on chronic resp failure with R pleural effusion.     eICU Interventions  See orders  Full PCCM note to follow    Intervention Category Major Interventions: Respiratory failure - evaluation and management  Asencion Noble 09/14/2013, 5:44 PM

## 2013-08-24 NOTE — ED Notes (Signed)
Mark (stepson) called and notified of bed placement and updated on condition.

## 2013-08-24 NOTE — ED Provider Notes (Addendum)
CSN: [id number]     Arrival date & time 30-Aug-2013  1321 History   First MD Initiated Contact with Patient 30-Aug-2013 1324     Chief Complaint  Patient presents with  . Loss of Consciousness     (Consider location/radiation/quality/duration/timing/severity/associated sxs/prior Treatment) HPI Comments: Pt is at SNF following admission for multiple rib fractures.  Pt apparently was found slumped over and unconscious in a chair and EMS calle.d  Pt was oriented for EMS at their arrival, however continued to "nod off" in the ambulance truck en route with waking each time and remaining in the same mental state.  Pt denies HA, SOB, CP, nausea.  Pt reports CP and rib pain improving.  He feels a little SOB at times.  Dry cough on occasion.  No specific medications given.    The history is provided by the patient, medical records and the EMS personnel.    Past Medical History  Diagnosis Date  . History of aortic stenosis     a. Severe s/p bovine pericardial tissue AVR 04/2010.  Marland Kitchen Hypertension   . Fall 2011    FRACTURE VERTEBRAL  . Chronic diastolic CHF (congestive heart failure)     a. EF 50-55% by echo 05/2013.  Marland Kitchen Permanent atrial fibrillation     On chronic Coumadin anticoagulation  . Cancer   . CAD (coronary artery disease)     a. Nonobstructive by 2011 cath.  . Hyperlipidemia   . Complication of anesthesia     "drowsy for 2 days after OR" (01/26/2013)  . AAA (abdominal aortic aneurysm)     a. 01/2013: endovascular repair of abdominal aortic and left iliac aneurysm.  . Iliac artery aneurysm, left     a. 01/2013: endovascular repair of abdominal aortic and left iliac aneurysm.  . CKD (chronic kidney disease), stage III     a. Baseline Cr 2-2.2.  . Tricuspid regurgitation     a. Mod-severe TR by echo 05/2013.   Past Surgical History  Procedure Laterality Date  . Aortic valve replacement      NOV 2011-TISSUE VALVE, 23 mm Mitroflow aortic pericardial heart valve  . Cardiac catheterization       03/2010-non obstructive CAD; severe AS  . Vasectomy    . Tonsillectomy    . Cardiac valve replacement    . Embolization      coil embolization of the left hypogastric artery prior to endovascular repair/notes 01/26/2013  . Cataract extraction w/ intraocular lens  implant, bilateral Bilateral     "right ~ 1999; left ~ 2012" (01/26/2013)  . Abdominal aortic endovascular stent graft N/A 01/29/2013    Procedure: ABDOMINAL AORTIC ENDOVASCULAR STENT GRAFT - GORE; ULTRASOUND GUIDED;  Surgeon: Serafina Mitchell, MD;  Location[facility] OR;  Service: Vascular;  Laterality: N/A;   Family History  Problem Relation Age of Onset  . Hypertension Mother   . Heart disease Mother   . Heart attack Mother   . Hypertension Father   . Heart disease Father   . Heart disease Brother    History  Substance Use Topics  . Smoking status: Former Smoker -- 0.75 packs/day for 40 years    Types: Cigarettes  . Smokeless tobacco: Never Used     Comment: 01/26/2013 "stopped smoking for good in ~ 1985"  . Alcohol Use: No    Review of Systems  Constitutional: Negative for fever and chills.  Respiratory: Positive for shortness of breath.   Cardiovascular: Negative for palpitations and leg swelling.  Gastrointestinal:  Negative for nausea, vomiting, abdominal pain, diarrhea and constipation.       Abdominal bloating  Musculoskeletal: Negative for back pain and neck pain.  Neurological: Positive for syncope.  All other systems reviewed and are negative.      Allergies  Dust mite extract; Lipitor; Losartan; Metolazone; Ramipril; Zocor; Keflex; and Lovenox  Home Medications   Current Outpatient Rx  Name  Route  Sig  Dispense  Refill  . aspirin 81 MG tablet   Oral   Take 81 mg by mouth every Monday, Wednesday, and Friday.          Marland Kitchen azelastine (ASTELIN) 137 MCG/SPRAY nasal spray   Nasal   Place 1 spray into the nose 2 (two) times daily as needed. Use in each nostril as directed         . betamethasone  valerate (VALISONE) 0.1 % cream   Topical   Apply 1 application topically 3 (three) times a week.          . cloNIDine (CATAPRES) 0.1 MG tablet   Oral   Take 0.2 mg by mouth 2 (two) times daily.         Marland Kitchen diltiazem (CARDIZEM CD) 120 MG 24 hr capsule   Oral   Take 1 capsule (120 mg total) by mouth daily.         . furosemide (LASIX) 40 MG tablet   Oral   Take 120 mg by mouth 2 (two) times daily.          . hydrALAZINE (APRESOLINE) 25 MG tablet   Oral   Take 25 mg by mouth daily.          Marland Kitchen levothyroxine (SYNTHROID, LEVOTHROID) 25 MCG tablet   Oral   Take 25 mcg by mouth daily.         . Multiple Vitamins-Iron (MULTIVITAMIN/IRON PO)   Oral   Take 1 tablet by mouth daily.          . traMADol (ULTRAM) 50 MG tablet   Oral   Take 1-2 tablets (50-100 mg total) by mouth every 6 (six) hours as needed (Pain).   24 tablet   0   . valsartan (DIOVAN) 80 MG tablet   Oral   Take 1 tablet (80 mg total) by mouth daily.          BP 135/70  Pulse 50  Temp(Src) 97.5 F (36.4 C) (Oral)  Resp 12  Ht 5\' 10"  (1.778 m)  Wt 162 lb 8 oz (73.71 kg)  BMI 23.32 kg/m2  SpO2 98% Physical Exam  Nursing note and vitals reviewed. Constitutional: He appears well-developed and well-nourished. No distress.  HENT:  Head: Normocephalic and atraumatic.  Eyes: Conjunctivae and EOM are normal. No scleral icterus.  Neck: Normal range of motion. Neck supple.  Cardiovascular: Normal rate, regular rhythm and intact distal pulses.   Occasional extrasystoles are present. PMI is not displaced.   Murmur heard. Pulmonary/Chest: Accessory muscle usage present. Tachypnea noted. He has wheezes. He has rales.  Abdominal: Soft. He exhibits distension. He exhibits no mass. There is no tenderness. There is no rebound and no guarding.  Neurological: He is alert. Coordination normal.  Skin: Skin is warm. No rash noted. He is not diaphoretic. No pallor.  Psychiatric: He has a normal mood and affect.     ED Course  Procedures (including critical care time)  CRITICAL CARE Performed by: Donzetta Matters. Total critical care time: 30 min Critical care time was exclusive of separately  billable procedures and treating other patients. Critical care was necessary to treat or prevent imminent or life-threatening deterioration. Critical care was time spent personally by me on the following activities: development of treatment plan with patient and/or surrogate as well as nursing, discussions with consultants, evaluation of patient's response to treatment, examination of patient, obtaining history from patient or surrogate, ordering and performing treatments and interventions, ordering and review of laboratory studies, ordering and review of radiographic studies, pulse oximetry and re-evaluation of patient's condition.   Labs Review Labs Reviewed  CBC WITH DIFFERENTIAL - Abnormal; Notable for the following:    RBC 2.95 (*)    Hemoglobin 8.4 (*)    HCT 27.6 (*)    RDW 19.2 (*)    All other components within normal limits  BASIC METABOLIC PANEL - Abnormal; Notable for the following:    Chloride 92 (*)    CO2 36 (*)    BUN 55 (*)    Creatinine, Ser 2.43 (*)    GFR calc non Af Amer 22 (*)    GFR calc Af Amer 26 (*)    All other components within normal limits  TROPONIN I - Abnormal; Notable for the following:    Troponin I 0.50 (*)    All other components within normal limits  PRO B NATRIURETIC PEPTIDE - Abnormal; Notable for the following:    Pro B Natriuretic peptide (BNP) 12886.0 (*)    All other components within normal limits  I-STAT VENOUS BLOOD GAS, ED - Abnormal; Notable for the following:    pCO2, Ven 82.7 (*)    Bicarbonate 39.8 (*)    Acid-Base Excess 11.0 (*)    All other components within normal limits  APTT  PROTIME-INR  CK  URINALYSIS, ROUTINE W REFLEX MICROSCOPIC  SAMPLE TO BLOOD BANK   Imaging Review Dg Chest Port 1 View  08/25/2013   CLINICAL DATA:  Shortness of  breath, cough, congestion  EXAM: PORTABLE CHEST - 1 VIEW  COMPARISON:  08/19/2013  FINDINGS: There is moderate size right pleural effusion increased from prior exam. Right lower lobe atelectasis or infiltrate. Small left pleural effusion with left basilar atelectasis. Status post median sternotomy. No pulmonary edema. Atherosclerotic calcifications of thoracic aorta.  IMPRESSION: Moderate size right pleural effusion increased from prior exam. Right lower lobe atelectasis or infiltrate. Small left pleural effusion with left basilar atelectasis.   Electronically Signed   By: Lahoma Crocker M.D.   On: 08/10/2013 15:44     EKG Interpretation   Date/Time:  Monday August 24 2013 13:35:17 EDT Ventricular Rate:  88 PR Interval:    QRS Duration: 103 QT Interval:  387 QTC Calculation: 468 R Axis:   87 Text Interpretation:  Atrial fibrillation Borderline right axis deviation  Poor R wave progression , seen previously Abnormal ekg Confirmed by [facility]   MD, MICHEAL ([id number]) on 08/08/2013 2:43:23 PM       3:59 PM PCO2 is elevated and likely hypercarbia is causing symptoms.  PCXR shows effusion is worse and is likely cause of this.  However also renal failure, troponin leak is also found.  Will need IVF's, gently, likely would benefit from thoracentesis and will admit to step down with medicine given the other complications and co-morbitities rather than trauma.  Pt has no CP or rib pain at this time.  Pt is tolerating BiPAP at this time.     4:45 PM Spoke to Dr. Wendee Beavers who feels pt would best served on another service such as  pulmonary.  I will contact intensivist to see pt.  Otherwise intensivist and Dr. Wendee Beavers can discuss together on who would best serve the patient.   5:18 PM Given the BNP, will order IV lasix for diruesis despite some degree of renal insuff.  Pulmonary effusions likely related to renal insuff as well as CHF.  Foley is already placed, UA is normal.    MDM   Final diagnoses:  Hypercarbia   Dyspnea  Renal failure  Cardiac enzymes elevated  Pleural effusion  CHF (congestive heart failure)    Pt is awake, oriented, following commands, but not complicated questions.  VBG drawn due to suspicion for hypercarbia clinically, pCO2 is quite elevated at >80.  Will need positive pressure ventilation.  Will need admission.  Anemia is stable and at prior values.      Saddie Benders. Dorna Mai, MD 08/27/2013 [place] Dorna Mai, MD 08/13/2013 [place] Dorna Mai, MD 08/05/2013 [place] Dorna Mai, MD 08/23/2013 [place] Tymir Terral, MD 08/30/2013 1719

## 2013-08-24 NOTE — Progress Notes (Signed)
Janett Billow from [place] [facility] called to check status of pt. Pt gave permission to update her. Jessica left the number for [facility], see sticky note. Informed me that pt already received his aspirin today.

## 2013-08-24 NOTE — ED Notes (Signed)
Spoke to [place], patients step-daughter, to update on condition with patient permission. Allowed patient to communicate to daughter with RN assistance. Pt alert and oriented, still sleepy.

## 2013-08-24 NOTE — ED Notes (Signed)
Kelly RT-at bedside hooking patient to bipap

## 2013-08-24 NOTE — ED Notes (Signed)
Per EMS pt from [facility] [facility] [employer], called out for syncopal episode in chair, no injury. Upon EMS arrival pt alert and oriented x4, in no acute distress. Skin warm and dry. In truck patient has episodes of nodding off and slumping over, pt easily arousable by calling name. Pt lung sounds clear- respirations 24 breaths a min. Pt on 2L of O2 at baseline- EMS placed pt on 4L.   Pt has recent history of fall on the 11th with 3 rib & 3 vertebrae fractures. Pt has pressure ulcer on left foot.  Pt negative for orthostatics Sitting BP 110/50 HR 60. Standing 120/palpated  HR 60. CBG 175

## 2013-08-24 NOTE — H&P (Signed)
PULMONARY / CRITICAL CARE MEDICINE   Name: Evan Perez MRN: [id number] DOB: 12-16-1922    ADMISSION DATE:  08/18/2013 CONSULTATION DATE:  08/18/2013   REFERRING MD :  EDP PRIMARY SERVICE: PCCM  CHIEF COMPLAINT:  Shortness of breath  BRIEF PATIENT DESCRIPTION: 80 y/o male was admitted on 3/23 with a recurrent pleural effusion and shortness of breath.  SIGNIFICANT EVENTS / STUDIES:  3/23 Admitted  LINES / TUBES:   CULTURES:   ANTIBIOTICS:   HISTORY OF PRESENT ILLNESS:  80 y/o male was admitted on 3/23 with a recurrent pleural effusion and shortness of breath.  This gentleman was admitted on 3/11 by the trauma service after a fall leading to a R pleural effusion.  This effusion was bloody and exudative and was felt to be due to the trauma of the fall.  The chest tube was removed on 2/17 but the effusion re-accumulated.  Dr. Nelda Marseille saw the patient no 3/18 and felt that the exudative effusion was due to CHF and recommended diuresis.  On 3/20 the patient saw Dr. Melvyn Novas in the [facility] Pulmonary office.  No CXR could be performed because the machine was down, but the patient's ACE inhibitor was held for fear possibly causing cough.  Over the weekend despite diuresis the patient became more short of breath.  On 3/23 he came to the ED and [facility] were consulted for admission.  They felt the patient needed pulmonary involvement so Dr. Joya Gaskins with [facility] PCCM was called.  He admitted the patient to the ICU with BIPAP.   Upon arrival to the ICU the BIPAP was removed and the patient was allowed to rest comfortably on 2L [place].  He now feels mildly short of breath.  He is not in pain.  He denies significant cough.  PAST MEDICAL HISTORY :  Past Medical History  Diagnosis Date  . History of aortic stenosis     a. Severe s/p bovine pericardial tissue AVR 04/2010.  Marland Kitchen Hypertension   . Fall 2011    FRACTURE VERTEBRAL  . Chronic diastolic CHF (congestive heart failure)     a. EF 50-55% by echo  05/2013.  Marland Kitchen Permanent atrial fibrillation     On chronic Coumadin anticoagulation  . Cancer   . CAD (coronary artery disease)     a. Nonobstructive by 2011 cath.  . Hyperlipidemia   . Complication of anesthesia     "drowsy for 2 days after OR" (01/26/2013)  . AAA (abdominal aortic aneurysm)     a. 01/2013: endovascular repair of abdominal aortic and left iliac aneurysm.  . Iliac artery aneurysm, left     a. 01/2013: endovascular repair of abdominal aortic and left iliac aneurysm.  . CKD (chronic kidney disease), stage III     a. Baseline Cr 2-2.2.  . Tricuspid regurgitation     a. Mod-severe TR by echo 05/2013.   Past Surgical History  Procedure Laterality Date  . Aortic valve replacement      NOV 2011-TISSUE VALVE, 23 mm Mitroflow aortic pericardial heart valve  . Cardiac catheterization      03/2010-non obstructive CAD; severe AS  . Vasectomy    . Tonsillectomy    . Cardiac valve replacement    . Embolization      coil embolization of the left hypogastric artery prior to endovascular repair/notes 01/26/2013  . Cataract extraction w/ intraocular lens  implant, bilateral Bilateral     "right ~ 1999; left ~ 2012" (01/26/2013)  . Abdominal aortic endovascular  stent graft N/A 01/29/2013    Procedure: ABDOMINAL AORTIC ENDOVASCULAR STENT GRAFT - GORE; ULTRASOUND GUIDED;  Surgeon: Serafina Mitchell, MD;  Location: [facility] OR;  Service: Vascular;  Laterality: N/A;   Prior to Admission medications   Medication Sig Start Date End Date Taking? Authorizing Provider  aspirin 81 MG tablet Take 81 mg by mouth every Monday, Wednesday, and Friday.    Yes Historical Provider, MD  azelastine (ASTELIN) 137 MCG/SPRAY nasal spray Place 1 spray into the nose 2 (two) times daily as needed. Use in each nostril as directed   Yes Historical Provider, MD  betamethasone valerate (VALISONE) 0.1 % cream Apply 1 application topically 3 (three) times a week.    Yes Historical Provider, MD  cloNIDine (CATAPRES) 0.1 MG  tablet Take 0.2 mg by mouth 2 (two) times daily.   Yes Historical Provider, MD  diltiazem (CARDIZEM CD) 120 MG 24 hr capsule Take 1 capsule (120 mg total) by mouth daily. 08/20/13  Yes Lisette Abu, PA-C  furosemide (LASIX) 40 MG tablet Take 120 mg by mouth 2 (two) times daily.    Yes Historical Provider, MD  hydrALAZINE (APRESOLINE) 25 MG tablet Take 25 mg by mouth daily.    Yes Historical Provider, MD  levothyroxine (SYNTHROID, LEVOTHROID) 25 MCG tablet Take 25 mcg by mouth daily. 07/31/13  Yes Historical Provider, MD  Multiple Vitamins-Iron (MULTIVITAMIN/IRON PO) Take 1 tablet by mouth daily.    Yes Historical Provider, MD  traMADol (ULTRAM) 50 MG tablet Take 1-2 tablets (50-100 mg total) by mouth every 6 (six) hours as needed (Pain). 08/20/13  Yes Lisette Abu, PA-C  valsartan (DIOVAN) 80 MG tablet Take 1 tablet (80 mg total) by mouth daily. 08/21/13  Yes Tanda Rockers, MD   Allergies  Allergen Reactions  . Dust Mite Extract Other (See Comments)    Dripping nose, irritable  . Lipitor [Atorvastatin Calcium] Other (See Comments)    Nervousness     . Losartan     Blurred vision, trouble sleeping  . Metolazone Nausea Only  . Ramipril Other (See Comments)    Pt cannot remember reaction   . Zocor [Simvastatin] Other (See Comments)    Nervousness   . Keflex [Cephalexin] Rash  . Lovenox [Enoxaparin Sodium] Rash    Rash on abdomen, back, lower legs.     FAMILY HISTORY:  Family History  Problem Relation Age of Onset  . Hypertension Mother   . Heart disease Mother   . Heart attack Mother   . Hypertension Father   . Heart disease Father   . Heart disease Brother    SOCIAL HISTORY:  reports that he has quit smoking. His smoking use included Cigarettes. He has a 30 pack-year smoking history. He has never used smokeless tobacco. He reports that he does not drink alcohol or use illicit drugs.  REVIEW OF SYSTEMS:   Gen: Denies fever, chills, weight change, + fatigue, night  sweats HEENT: Denies blurred vision, double vision, hearing loss, tinnitus, sinus congestion, rhinorrhea, sore throat, neck stiffness, dysphagia PULM: per HPI CV: Denies chest pain, edema, orthopnea, paroxysmal nocturnal dyspnea, palpitations GI: Denies abdominal pain, nausea, vomiting, diarrhea, hematochezia, melena, constipation, change in bowel habits GU: Denies dysuria, hematuria, polyuria, oliguria, urethral discharge Endocrine: Denies hot or cold intolerance, polyuria, polyphagia or appetite change Derm: Denies rash, dry skin, notes slowly healing skin on heels bilaterally Heme: Denies easy bruising, bleeding, bleeding gums Neuro: Denies headache, numbness, weakness, slurred speech, loss of memory or consciousness  SUBJECTIVE:   VITAL SIGNS: Temp:  [97.5 F (36.4 C)] 97.5 F (36.4 C) (03/23 1335) Pulse Rate:  [50-95] 93 (03/23 2100) Resp:  [12-27] 13 (03/23 2200) BP: (101-155)/(47-73) 155/55 mmHg (03/23 2200) SpO2:  [91 %-100 %] 100 % (03/23 2200) Weight:  [73.71 kg (162 lb 8 oz)-74.1 kg (163 lb 5.8 oz)] 74.1 kg (163 lb 5.8 oz) (03/23 2000) HEMODYNAMICS:   VENTILATOR SETTINGS:   INTAKE / OUTPUT: Intake/Output     03/23 0701 - 03/24 0700   I.V. (mL/kg) 3.5 (0)   Total Intake(mL/kg) 3.5 (0)   Urine (mL/kg/hr) 300   Total Output 300   Net -296.5         PHYSICAL EXAMINATION: Gen: chronically ill appearing, no acute distress HEENT: NCAT, EOMi, OP clear, neck supple without masses PULM: Crackles R mid lung, diminished R base, CTA on left CV: Irreg irreg, no mgr, no JVD AB: BS+, soft, nontender, no hsm Ext: warm, trace edema, no clubbing, no cyanosis Derm: Left foot wrapped in ACE Neuro: A&Ox4, moves all four ext   LABS:  CBC  Recent Labs Lab 08/18/13 0317 08/19/13 0427 08/15/2013 1358  WBC 10.5 10.0 9.2  HGB 8.0* 8.2* 8.4*  HCT 25.1* 25.4* 27.6*  PLT 155 177 290   Coag's  Recent Labs Lab 08/09/2013 1358  APTT 34  INR 1.10   BMET  Recent  Labs Lab 08/18/13 0317 08/15/2013 1358  NA 132* 137  K 4.2 4.2  CL 92* 92*  CO2 29 36*  BUN 48* 55*  CREATININE 1.56* 2.43*  GLUCOSE 93 99   Electrolytes  Recent Labs Lab 08/18/13 0317 08/21/2013 1358  CALCIUM 8.2* 8.4   Sepsis Markers No results found for this basename: LATICACIDVEN, PROCALCITON, O2SATVEN,  in the last 168 hours ABG No results found for this basename: PHART, PCO2ART, PO2ART,  in the last 168 hours Liver Enzymes No results found for this basename: AST, ALT, ALKPHOS, BILITOT, ALBUMIN,  in the last 168 hours Cardiac Enzymes  Recent Labs Lab 08/27/2013 1405 08/22/2013 1614  TROPONINI 0.50*  --   PROBNP  --  12886.0*   Glucose  Recent Labs Lab 08/14/2013 1931 08/25/2013 2031 08/16/2013 2122  GLUCAP 51* 26* 110*    Imaging  3/24 CXR: R pleural effusion noted, larger than 3/18 study  ASSESSMENT / PLAN:  PULMONARY A: Recurrent R exudative pleural effusion after trauma; presumably inflammatory related to trauma; not hemothorax as Hct fluid 3/13 was only 1.0;  Currently slightly tachypnic but resting comfortably P:   -plan R therapeutic thoracentesis 3/24 AM -O2 as needed overnight 3/23 -BIPAP prn only > will perform thoracentesis overnight if needs BIPAP  CARDIOVASCULAR A: Afib Hypertension CHF > euvolemic P:  -continue home meds (diltiazem, irbesartan, hydralazine) -tele -hold furosemide  RENAL A:  Acute on chronic CKD, likely due to overdiuresis P:   -hold lasix  GASTROINTESTINAL A:  No acute issues P:   -diet as tolerated  HEMATOLOGIC  A:  Afib, warfarin currently held for bloody pleural effusion post trauma P:  -hold warfarin  INFECTIOUS A:  No acute issues P:   -monitor for fever  ENDOCRINE A:  No acute issues P:   -monitor glucose  NEUROLOGIC A:  Lumbar fracture post fall on 3/11 P:   -continue lumbar brace -prn tramadol -consult PT 3/24  Simonne Maffucci [place] P[facility]M Pager: [phone] Cell: [phone] If no  response, call [phone]   08/03/2013, 11:01 PM

## 2013-08-24 NOTE — ED Notes (Signed)
Evan Perez unable to get blood- [name] at bedside to attempt.

## 2013-08-24 NOTE — Telephone Encounter (Signed)
Spoke to [place], he wanted to let Dr. Raliegh Ip know pt has been having hard times and is being admitted to the hospital again today so pt will not make appointment tomorrow and that as soon as he is out will call for follow up and Elta [place] said he would like to be kept in the loop regarding pt's care. Told him okay I will let Dr. Raliegh Ip know and when pt is discharged just call and ask for me and I will get him scheduled right away. Evan Perez verbalized understanding. Dr. Raliegh Ip made aware.

## 2013-08-24 NOTE — ED Notes (Signed)
Patient frustrated about NPO status and being in hospital. RN explained results with Ghim MD permission and patient admission. Pt ank

## 2013-08-24 NOTE — ED Notes (Signed)
Dr. Dorna Mai and phlebotomy at bedside.

## 2013-08-25 ENCOUNTER — Inpatient Hospital Stay (HOSPITAL_COMMUNITY): Payer: Medicare Other

## 2013-08-25 ENCOUNTER — Ambulatory Visit: Payer: Medicare Other | Admitting: Internal Medicine

## 2013-08-25 DIAGNOSIS — R0989 Other specified symptoms and signs involving the circulatory and respiratory systems: Secondary | ICD-10-CM

## 2013-08-25 DIAGNOSIS — R0609 Other forms of dyspnea: Secondary | ICD-10-CM

## 2013-08-25 LAB — BASIC METABOLIC PANEL
BUN: 56 mg/dL — ABNORMAL HIGH (ref 6–23)
CO2: 35 meq/L — AB (ref 19–32)
Calcium: 8.4 mg/dL (ref 8.4–10.5)
Chloride: 92 mEq/L — ABNORMAL LOW (ref 96–112)
Creatinine, Ser: 2.5 mg/dL — ABNORMAL HIGH (ref 0.50–1.35)
GFR calc non Af Amer: 21 mL/min — ABNORMAL LOW (ref >90)
GFR, EST AFRICAN AMERICAN: 25 mL/min — AB (ref >90)
Glucose, Bld: 113 mg/dL — ABNORMAL HIGH (ref 70–99)
POTASSIUM: 4.7 meq/L (ref 3.7–5.3)
SODIUM: 137 meq/L (ref 137–147)

## 2013-08-25 LAB — BODY FLUID CELL COUNT WITH DIFFERENTIAL
Lymphs, Fluid: 22 %
Monocyte-Macrophage-Serous Fluid: 49 % — ABNORMAL LOW (ref 50–90)
Neutrophil Count, Fluid: 29 % — ABNORMAL HIGH (ref 0–25)
WBC FLUID: 295 uL (ref 0–1000)

## 2013-08-25 LAB — GLUCOSE, CAPILLARY
GLUCOSE-CAPILLARY: 106 mg/dL — AB (ref 70–99)
Glucose-Capillary: 111 mg/dL — ABNORMAL HIGH (ref 70–99)
Glucose-Capillary: 116 mg/dL — ABNORMAL HIGH (ref 70–99)
Glucose-Capillary: 139 mg/dL — ABNORMAL HIGH (ref 70–99)
Glucose-Capillary: 97 mg/dL (ref 70–99)

## 2013-08-25 LAB — CBC
HCT: 27.7 % — ABNORMAL LOW (ref 39.0–52.0)
Hemoglobin: 8.4 g/dL — ABNORMAL LOW (ref 13.0–17.0)
MCH: 28.7 pg (ref 26.0–34.0)
MCHC: 30.3 g/dL (ref 30.0–36.0)
MCV: 94.5 fL (ref 78.0–100.0)
PLATELETS: 255 10*3/uL (ref 150–400)
RBC: 2.93 MIL/uL — AB (ref 4.22–5.81)
RDW: 19.2 % — AB (ref 11.5–15.5)
WBC: 7.8 10*3/uL (ref 4.0–10.5)

## 2013-08-25 LAB — LACTATE DEHYDROGENASE, PLEURAL OR PERITONEAL FLUID: LD FL: 273 U/L — AB (ref 3–23)

## 2013-08-25 LAB — PROTEIN, BODY FLUID: TOTAL PROTEIN, FLUID: 2.9 g/dL

## 2013-08-25 MED ORDER — HALOPERIDOL LACTATE 5 MG/ML IJ SOLN
5.0000 mg | Freq: Once | INTRAMUSCULAR | Status: AC
Start: 2013-08-26 — End: 2013-08-26
  Administered 2013-08-26: 5 mg via INTRAVENOUS

## 2013-08-25 MED ORDER — BIOTENE DRY MOUTH MT LIQD
15.0000 mL | Freq: Two times a day (BID) | OROMUCOSAL | Status: DC
  Administered 2013-08-25 (×3): 15 mL via OROMUCOSAL

## 2013-08-25 NOTE — Procedures (Signed)
Thoracentesis Indication: Large R effusion with resp compromise  Consent: obtained from family member over phone  Procedure: Pocket of fluid identified by [place] and percussion. Area cleaned in sterile manner. Arrow kit used. Usual technique. Fluid easily obtained. 2400 cc blood tinged fluid withdrawn without complications.   CXR: follow up CXR reveals much improved aeration on R with no discernible pneumothorax   Merton Border, MD ; [facility] service Mobile [phone].  After 5:30 PM or weekends, call [phone]

## 2013-08-25 NOTE — Progress Notes (Signed)
UR Completed.  Evan Perez [phone] 08/25/2013  

## 2013-08-25 NOTE — Progress Notes (Signed)
PULMONARY / CRITICAL CARE MEDICINE   Name: Evan Perez MRN: [id number] DOB: 06-Mar-1923    ADMISSION DATE:  Sep 18, 2013 CONSULTATION DATE:  09-18-13   REFERRING MD :  EDP PRIMARY SERVICE: PCCM  CHIEF COMPLAINT:  Shortness of breath  BRIEF PATIENT DESCRIPTION: 80 y/o male was admitted on 3/23 with a recurrent pleural effusion and shortness of breath.  SIGNIFICANT EVENTS / STUDIES:  3/23 Admitted 3/24 R thoracentesis: 2400 cc blood tinged to bloody fluid removed without complications. Protein:   LDH:    WBC diff:   LINES / TUBES:   CULTURES: Pleural fluid  ANTIBIOTICS:    SUBJECTIVE:  Moderately dyspneic prior to thoracentesis. Dyspnea much improved after thora. No new complaints. Poorly oriented  VITAL SIGNS: Temp:  [97.4 F (36.3 C)-98.4 F (36.9 C)] 98.4 F (36.9 C) (03/24 1627) Pulse Rate:  [66-97] 87 (03/24 1100) Resp:  [13-23] 14 (03/24 1600) BP: (104-155)/(41-69) 108/43 mmHg (03/24 1600) SpO2:  [95 %-100 %] 100 % (03/24 1600) Weight:  [74.1 kg (163 lb 5.8 oz)-74.6 kg (164 lb 7.4 oz)] 74.6 kg (164 lb 7.4 oz) (03/24 0400) HEMODYNAMICS:   VENTILATOR SETTINGS:   INTAKE / OUTPUT: Intake/Output     03/23 0701 - 03/24 0700 03/24 0701 - 03/25 0700   P.O. 240 360   I.V. (mL/kg) 103.5 (1.4) 90 (1.2)   Total Intake(mL/kg) 343.5 (4.6) 450 (6)   Urine (mL/kg/hr) 690 109 (0.1)   Total Output 690 109   Net -346.5 +341        Stool Occurrence 1 x      PHYSICAL EXAMINATION: Gen: chronically ill appearing, no acute distress HEENT: NCAT, EOMi, OP clear, neck supple without masses PULM: Crackles R mid lung, diminished R base, CTA on left CV: Irreg irreg, no mgr, no JVD AB: BS+, soft, nontender, no hsm Ext: warm, trace edema, no clubbing, no cyanosis Derm: Left foot wrapped in ACE Neuro: A&Ox4, moves all four ext   LABS:  I have reviewed all of today's lab results. Relevant abnormalities are discussed in the A/P section  CXR: post thoracentesis - much  improved aeration on R  ASSESSMENT / PLAN:  PULMONARY A:  Acute resp failure Recurrent post traumatic R pleural effusion P:   Supplemental O2 as needed Monitor in ICU post thoracentesis Monitor for recurrence  CARDIOVASCULAR A: CAF Hypertension hx CHF > euvolemic P:  Monitor rhythm and BP  RENAL A:  Acute on chronic CKD, likely due to overdiuresis P:   Monitor BMET intermittently Monitor I/Os Correct electrolytes as indicated Holding furosemide  GASTROINTESTINAL A:  No acute issues P:   -diet as tolerated  HEMATOLOGIC  A:  Afib, warfarin currently held for bloody pleural effusion post trauma P:  Cont to hold warfarin  INFECTIOUS A:  No acute issues P:   -monitor for fever  ENDOCRINE A:  No acute issues P:   -monitor glucose  NEUROLOGIC A:  Lumbar fracture post fall on 3/11 P:   -continue lumbar brace -prn tramadol -consult PT   Merton Border, MD ; [facility] service Mobile [phone].  After 5:30 PM or weekends, call [phone]  08/25/2013, 5:27 PM

## 2013-08-26 ENCOUNTER — Inpatient Hospital Stay (HOSPITAL_COMMUNITY): Payer: Medicare Other

## 2013-08-26 DIAGNOSIS — R410 Disorientation, unspecified: Secondary | ICD-10-CM

## 2013-08-26 DIAGNOSIS — Z515 Encounter for palliative care: Secondary | ICD-10-CM

## 2013-08-26 DIAGNOSIS — Z66 Do not resuscitate: Secondary | ICD-10-CM

## 2013-08-26 LAB — URINALYSIS, ROUTINE W REFLEX MICROSCOPIC
Bilirubin Urine: NEGATIVE
Glucose, UA: NEGATIVE mg/dL
Hgb urine dipstick: NEGATIVE
KETONES UR: NEGATIVE mg/dL
Nitrite: NEGATIVE
PROTEIN: NEGATIVE mg/dL
Specific Gravity, Urine: 1.019 (ref 1.005–1.030)
Urobilinogen, UA: 1 mg/dL (ref 0.0–1.0)
pH: 5 (ref 5.0–8.0)

## 2013-08-26 LAB — CBC
HCT: 32.1 % — ABNORMAL LOW (ref 39.0–52.0)
Hemoglobin: 9.7 g/dL — ABNORMAL LOW (ref 13.0–17.0)
MCH: 27.8 pg (ref 26.0–34.0)
MCHC: 30.2 g/dL (ref 30.0–36.0)
MCV: 92 fL (ref 78.0–100.0)
PLATELETS: 254 10*3/uL (ref 150–400)
RBC: 3.49 MIL/uL — AB (ref 4.22–5.81)
RDW: 19 % — ABNORMAL HIGH (ref 11.5–15.5)
WBC: 8.7 10*3/uL (ref 4.0–10.5)

## 2013-08-26 LAB — URINE MICROSCOPIC-ADD ON

## 2013-08-26 LAB — BASIC METABOLIC PANEL
BUN: 61 mg/dL — ABNORMAL HIGH (ref 6–23)
CALCIUM: 8.4 mg/dL (ref 8.4–10.5)
CHLORIDE: 89 meq/L — AB (ref 96–112)
CO2: 32 mEq/L (ref 19–32)
CREATININE: 2.73 mg/dL — AB (ref 0.50–1.35)
GFR calc Af Amer: 22 mL/min — ABNORMAL LOW (ref >90)
GFR calc non Af Amer: 19 mL/min — ABNORMAL LOW (ref >90)
Glucose, Bld: 84 mg/dL (ref 70–99)
Potassium: 5.2 mEq/L (ref 3.7–5.3)
SODIUM: 133 meq/L — AB (ref 137–147)

## 2013-08-26 LAB — PATHOLOGIST SMEAR REVIEW: Path Review: REACTIVE

## 2013-08-26 MED ORDER — ASPIRIN 300 MG RE SUPP
300.0000 mg | RECTAL | Status: DC
  Administered 2013-08-26: 300 mg via RECTAL
  Filled 2013-08-26: qty 1

## 2013-08-26 MED ORDER — DILTIAZEM HCL 25 MG/5ML IV SOLN
10.0000 mg | Freq: Once | INTRAVENOUS | Status: AC
  Administered 2013-08-26: 10 mg via INTRAVENOUS
  Filled 2013-08-26: qty 5

## 2013-08-26 MED ORDER — HALOPERIDOL LACTATE 5 MG/ML IJ SOLN
10.0000 mg | Freq: Once | INTRAMUSCULAR | Status: AC
  Administered 2013-08-26: 10 mg via INTRAVENOUS

## 2013-08-26 MED ORDER — DILTIAZEM HCL 100 MG IV SOLR
5.0000 mg/h | INTRAVENOUS | Status: DC
  Administered 2013-08-26: 10 mg/h via INTRAVENOUS
  Administered 2013-08-26: 5 mg/h via INTRAVENOUS
  Filled 2013-08-26 (×2): qty 100

## 2013-08-26 MED ORDER — FENTANYL CITRATE 0.05 MG/ML IJ SOLN
INTRAMUSCULAR | Status: AC
  Filled 2013-08-26: qty 4

## 2013-08-26 MED ORDER — HALOPERIDOL LACTATE 5 MG/ML IJ SOLN
INTRAMUSCULAR | Status: AC
  Filled 2013-08-26: qty 2

## 2013-08-26 MED ORDER — MIDAZOLAM HCL 2 MG/2ML IJ SOLN
INTRAMUSCULAR | Status: AC
  Filled 2013-08-26: qty 4

## 2013-08-26 MED ORDER — HALOPERIDOL LACTATE 5 MG/ML IJ SOLN: 1.0000 mg | INTRAMUSCULAR | Status: DC | PRN

## 2013-08-26 MED ORDER — LEVOTHYROXINE SODIUM 100 MCG IV SOLR
12.5000 ug | Freq: Every day | INTRAVENOUS | Status: DC
  Administered 2013-08-26: 12.5 ug via INTRAVENOUS
  Filled 2013-08-26: qty 5

## 2013-08-26 MED ORDER — DEXTROSE 5 % IV SOLN
2.0000 ug/min | INTRAVENOUS | Status: DC
  Filled 2013-08-26: qty 4

## 2013-08-26 MED ORDER — HALOPERIDOL LACTATE 5 MG/ML IJ SOLN
INTRAMUSCULAR | Status: AC
  Filled 2013-08-26: qty 1

## 2013-08-26 MED ORDER — ONDANSETRON HCL 4 MG/2ML IJ SOLN
4.0000 mg | Freq: Three times a day (TID) | INTRAMUSCULAR | Status: DC | PRN
  Administered 2013-08-26: 4 mg via INTRAVENOUS
  Filled 2013-08-26: qty 2

## 2013-08-27 ENCOUNTER — Telehealth: Payer: Self-pay | Admitting: Cardiology

## 2013-08-27 MED FILL — Medication: Qty: 1 | Status: AC

## 2013-08-27 NOTE — Telephone Encounter (Signed)
Evan Perez is aware of Evan Perez last flu & pneumonia vaccine dates Horton Chin RN

## 2013-08-27 NOTE — Telephone Encounter (Signed)
New message      Pt was a resident at [facility].  He died yesterday.  They want to know if he had a pneumonia and flu shot this past year.  They need the dates to close out some records.  They have Dr Mare Ferrari listed at the PCP.

## 2013-08-27 NOTE — Telephone Encounter (Signed)
lmtcb Debbie Dilraj Killgore RN  

## 2013-08-28 LAB — BODY FLUID CULTURE
Culture: NO GROWTH
Gram Stain: NONE SEEN

## 2013-09-02 NOTE — Progress Notes (Signed)
PULMONARY / CRITICAL CARE MEDICINE   Name: Evan Perez MRN: [id number] DOB: [birth year]    ADMISSION DATE:  08/20/2013 CONSULTATION DATE:  08/19/2013   REFERRING MD :  EDP PRIMARY SERVICE: PCCM  CHIEF COMPLAINT:  Shortness of breath  BRIEF PATIENT DESCRIPTION: 80 y/o male was admitted on 3/23 with a recurrent pleural effusion and shortness of breath.  SIGNIFICANT EVENTS / STUDIES:  3/23 Admitted 3/24 R thoracentesis: 2400 cc blood tinged to bloody fluid removed without complications. Protein: 2.9  LDH: 273   WBC: 295 (monocyte predominant) 3/25 Marked agitated delirium. Received haloperidol with resultant obtundation  LINES / TUBES:   CULTURES: Pleural fluid 3/24 >>   ANTIBIOTICS:   SUBJECTIVE:  Marked agitation last PM.  Received haloperidol with resultant obtundation. Now on VM to maintain SpO2  VITAL SIGNS: Temp:  [98 F (36.7 C)-98.5 F (36.9 C)] 98 F (36.7 C) (03/25 1236) Pulse Rate:  [63-153] 125 (03/25 1400) Resp:  [10-27] 26 (03/25 1400) BP: (91-156)/(34-70) 115/44 mmHg (03/25 1400) SpO2:  [45 %-100 %] 91 % (03/25 1400) FiO2 (%):  [55 %-100 %] 100 % (03/25 1430) Weight:  [73.6 kg (162 lb 4.1 oz)] 73.6 kg (162 lb 4.1 oz) (03/25 0303) HEMODYNAMICS:   VENTILATOR SETTINGS: Vent Mode:  [-]  FiO2 (%):  [55 %-100 %] 100 % INTAKE / OUTPUT: Intake/Output     03/24 0701 - 03/25 0700 03/25 0701 - 03/26 0700   P.O. 720    I.V. (mL/kg) 240 (3.3) 64.3 (0.9)   Other  80   Total Intake(mL/kg) 960 (13) 144.3 (2)   Urine (mL/kg/hr) 517 (0.3)    Total Output 517     Net +443 +144.3        Urine Occurrence 1 x    Stool Occurrence  1 x     PHYSICAL EXAMINATION: Gen: Somnolent, NAD HEENT: WL PULM: No wheezes CV: Irreg irreg, no M AB: BS+, soft, nontender, no hsm Ext: warm, trace edema, no clubbing, no cyanosis Derm: Left foot wrapped in ACE Neuro: A&Ox4, moves all four ext   LABS:  I have reviewed all of today's lab results. Relevant abnormalities are  discussed in the A/P section  CXR: Aeration of both lungs is slightly decreased today. Increasing  density in the right mid and lower lung and at the left lung base is  present consistent with atelectasis or pneumonia with associated  pleural effusions/ hemothorax.  ASSESSMENT / PLAN:  PULMONARY A:  Acute resp failure Recurrent post traumatic R pleural effusion P:   Supplemental O2 as needed Monitor in ICU post thoracentesis Monitor for recurrence  CARDIOVASCULAR A: CAF, RVR Hypertension hx CHF > euvolemic P:  Monitor rhythm and BP Dilt gtt 3/25  RENAL A:  Acute on chronic CKD, likely due to overdiuresis P:   Monitor BMET intermittently Monitor I/Os Correct electrolytes as indicated Holding furosemide  GASTROINTESTINAL A:  No acute issues P:   -diet as tolerated  HEMATOLOGIC  A:  Afib, warfarin currently held for bloody pleural effusion post trauma Probably not a candidate for long term anticoagulation at this point in his life P:  Cont to hold warfarin DVT px: SCDs  Monitor CBC intermittently Transfuse per usual ICU guidelines  INFECTIOUS A:  No acute issues P:   -monitor for fever  ENDOCRINE A:  No acute issues P:   -monitor glucose  NEUROLOGIC A:  Lumbar fracture post fall on 3/11 Severe ICU acquired delirium P:   continue lumbar brace DC prn  tramadol Cont PT Use only very low dose Haldol for agitation  Have asked to arrange family conference in AM 3/26  Merton Border, MD ; [facility] service Mobile [phone].  After 5:30 PM or weekends, call [phone]  2013-09-12, 2:51 PM

## 2013-09-02 NOTE — Consult Note (Addendum)
WOC wound consult note Reason for Consult: Pt familiar to [facility] team from previous admission.  Refer to progress notes from 3/16.  Previously had deep tissue injury present on last admission which evolved into a stage 2 wound during the hospitalization.  Pt has been followed by the outpatient wound care center prior to admission. Right heel red but blanches. Left upper leg with partail thickness wound .2X.2X.1cm, 100% pink and moist.  Foam dressing applied to promote healing. Wound type: Left heel with unstageable pressure ulcer Pressure Ulcer POA: Yes Measurement:2.2X2.2cm Wound bed: 50% red, 50% yellow slough Drainage (amount, consistency, odor) Mod amt tan drainage, no odor Periwound: Erythemia surrounding to 1 cm Dressing procedure/placement/frequency: Pt had silver alginate dressing, foam dressing, and ace wrap all present on admission.  Family members are not present to answer questions regarding whether pt is still being followed by the outpatient wound center.  Pt cannot answer questions at this time. Plan: Continue present plan of care with Aquacel, foam dressing, and ace wrap for light compression.  Float left heel at all times to reduce pressure. Recommend follow-up at the outpatient wound care center after discharge. Please re-consult if further assistance is needed.  Thank-you,  Julien Girt MSN, [place], [place], [place], [place]

## 2013-09-02 NOTE — Progress Notes (Signed)
During hand off report in the room, pt's bp had decreased and oxygen saturation decreased to the 70's.  Bipap was increased from 80% O2 to 100% O2.  RT and 2 RN's were at the bedside.  Elink was called as the pt's oxygen saturation remained in the 70's even with the additional O2.  Dr. Joya Gaskins camera'd in and advised to bag the patient until Dr. Halford Chessman arrived.  Pulse monitored, bp taken q67min.  BP continued to drop.  RSI kit and Levophed ordered.  500cc bolus initiated, additional PIV in left arm was started.  Cardizem was turned off and Levophed started once arrived.  Code called.  Dr. Joya Gaskins spoke with family in the interim and then decided upon making the pt a DNR.  Code was then cancelled and pt made comfortable.  Pt passed at [place] per cardiac telemetry.  Dr. Joya Gaskins called family to inform them that the pt had passed.

## 2013-09-02 NOTE — Clinical Social Work Psychosocial (Signed)
Clinical Social Work Department BRIEF PSYCHOSOCIAL ASSESSMENT 09-17-13  Patient:  Evan Perez, Evan Perez     Account Number:  [id number]     Admit date:  08/10/2013  Clinical Social Worker:  Daiva Huge  Date/Time:  Sep 17, 2013 01:54 PM  Referred by:  Physician  Date Referred:  17-Sep-2013 Referred for  SNF Placement   Other Referral:   Interview type:  Other - See comment Other interview type:   SPoke with son Elta Guadeloupe via phone    PSYCHOSOCIAL DATA Living Status:  FACILITY Admitted from facility:  [facility] Level of care:  [place] Primary support name:  Elta Guadeloupe- son Primary support relationship to patient:  FAMILY Degree of support available:   good    CURRENT CONCERNS Current Concerns  Post-Acute Placement   Other Concerns:    SOCIAL WORK ASSESSMENT / PLAN Spoke with patient's son via phone who confirms pateint is from [place] where he has lived for 4 years in [place] until recently when he was hsopitalized after a fall and went to the SNF rehab unit there. Plans will be for him to return to the SNF rehab when appropriate-   Assessment/plan status:  Other - See comment Other assessment/ plan:   Will update FL2 for SNF return.   Information/referral to community resources:    PATIENT'S/FAMILY'S RESPONSE TO PLAN OF CARE: Son agrees to plans for return to [facility] at d/c- patient currently unable to participate in assessment/discussion to medical state.       Eduard Clos, MSW, [place]

## 2013-09-02 NOTE — Progress Notes (Signed)
eLink Physician-Brief Progress Note Patient Name: Evan Perez DOB: 1923-03-02 MRN: [id number]  Date of Service  Aug 29, 2013   HPI/Events of Note   Pt expired at 60PM   eICU Interventions  No autopsy   Intervention Category Major Interventions: Code management / supervision  Asencion Noble August 29, 2013, 7:50 PM

## 2013-09-02 NOTE — Progress Notes (Signed)
Pt awoke agitated around midnight. Pulling off leads, trying to get OOB. Refusing to have BP taken. 5 mg Haldol given per Dr. Jimmy Footman. Pt calmed down and went to sleep. At 0300, pt again agitated and climbing out of bed & pulling off leads and oxygen. Refused a.m. labs & in & out cath. Throwing pillows and shaking his fists at [employer]. 10 mg Haldol given per Dr. Jimmy Footman. In & out cath attempted after pt unable to void into urinal. Pt initially agreeable because his stomach was cramping, but grabbed tube and pulled it out while urine was still draining. 250 ml out(bladder scan showed 550).

## 2013-09-02 NOTE — Progress Notes (Signed)
eLink Physician-Brief Progress Note Patient Name: Evan Perez DOB: 05/21/1923 MRN: [id number]  Date of Service  08/25/2013   HPI/Events of Note  Acute delirium with agitation  eICU Interventions  Plan: Haldol 5 mg IV times one Nurse to call if no response   Intervention Category Major Interventions: Delirium, psychosis, severe agitation - evaluation and management  DETERDING,ELIZABETH 08/03/2013, 12:04 AM

## 2013-09-02 DEATH — deceased

## 2013-09-11 LAB — FUNGUS CULTURE W SMEAR: Fungal Smear: NONE SEEN

## 2013-09-15 ENCOUNTER — Ambulatory Visit: Payer: Medicare Other | Admitting: Internal Medicine

## 2013-09-17 NOTE — Discharge Summary (Signed)
DEATH SUMMARY  DATE OF ADMISSION:  09/15/2013  DATE OF DISCHARGE/DEATH:  September 17, 2013  ADMISSION DIAGNOSES:   Recurrent R exudative pleural effusion after trauma Afib, chronic  Hypertension  CHF Acute on chronic CKD, likely due to overdiuresis  No acute issues  Afib, warfarin currently held for bloody pleural effusion post trauma  Lumbar fracture post fall on 3/11   DISCHARGE DIAGNOSES:   Acute resp failure  Recurrent post traumatic R pleural effusion  CAF, RVR  Hypertension hx  CHF  Acute on chronic CKD, likely due to overdiuresis Lumbar fracture post fall on 3/11  Severe ICU acquired delirium   PRESENTATION:   Pt was admitted with the following HPI and the above admission diagnoses:  HISTORY OF PRESENT ILLNESS: 80 y/o male was admitted on 09-16-22 with a recurrent pleural effusion and shortness of breath. This gentleman was admitted on 3/11 by the trauma service after a fall leading to a R pleural effusion. This effusion was bloody and exudative and was felt to be due to the trauma of the fall. The chest tube was removed on 2/17 but the effusion re-accumulated. Dr. Nelda Marseille saw the patient no 3/18 and felt that the exudative effusion was due to CHF and recommended diuresis. On 3/20 the patient saw Dr. Melvyn Novas in the [facility] Pulmonary office. No CXR could be performed because the machine was down, but the patient's ACE inhibitor was held for fear possibly causing cough. Over the weekend despite diuresis the patient became more short of breath. On 09-16-2022 he came to the ED and [facility] were consulted for admission. They felt the patient needed pulmonary involvement so Dr. Joya Gaskins with [facility] was called. He admitted the patient to the ICU with BIPAP. Upon arrival to the ICU the BIPAP was removed and the patient was allowed to rest comfortably on 2L [place]. He now feels mildly short of breath. He is not in pain. He denies significant cough.   HOSPITAL COURSE:   Sep 16, 2022 Admitted to ICU, PCCM service  for PRN NPPV 3/24 R thoracentesis: 2400 cc blood tinged to bloody fluid removed without complications. Protein: 2.9 LDH: 273 WBC: 295 (monocyte predominance)  09-18-2022 Marked agitated delirium. Received haloperidol with resultant obtundation. Increasing O2 reqt's. Dr Joya Gaskins spoke with family re: goals of care. DNR/DNI established. Comfort measures undertaken. Pt expired @ 19:44     Merton Border, MD;  PCCM service; Mobile [phone]

## 2013-09-18 ENCOUNTER — Ambulatory Visit: Payer: Medicare Other | Admitting: Cardiology

## 2013-09-21 ENCOUNTER — Inpatient Hospital Stay: Admission: RE | Admit: 2013-09-21 | Payer: Medicare Other | Source: Ambulatory Visit

## 2013-09-21 ENCOUNTER — Ambulatory Visit: Payer: Medicare Other | Admitting: Surgery

## 2013-09-27 LAB — AFB CULTURE WITH SMEAR (NOT AT ARMC): ACID FAST SMEAR: NONE SEEN

## 2014-05-13 ENCOUNTER — Encounter (HOSPITAL_COMMUNITY): Payer: Self-pay | Admitting: Surgery

## 2015-07-14 IMAGING — CT CT CTA ABD/PEL W/CM AND/OR W/O CM
3 of 7 series · 12 of 36 positions shown, 18 images · IV contrast (60CC OMNI 350)
Comparison: None.

CLINICAL DATA: Abdominal aortic aneurysm.

CT ANGIOGRAPHY ABDOMEN AND PELVIS
TECHNIQUE: Multidetector CT imaging of the abdomen and pelvis was
performed using the standard protocol during bolus administration
of intravenous contrast.  Multiplanar reconstructed images
including MIPs were obtained and reviewed to evaluate the vascular
anatomy.
Contrast: 60mL OMNIPAQUE IOHEXOL 300 MG/ML  SOLN  intravenously.

[Series 4: angio · axial · 0.82mm/px · z∈[-408,-61]mm · 6 of 195 slices shown, 11 images]
[im 28/195  soft-tissue]
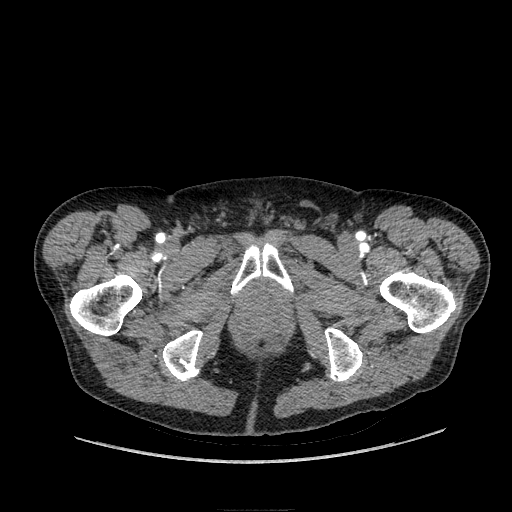
[im 28/195  bone]
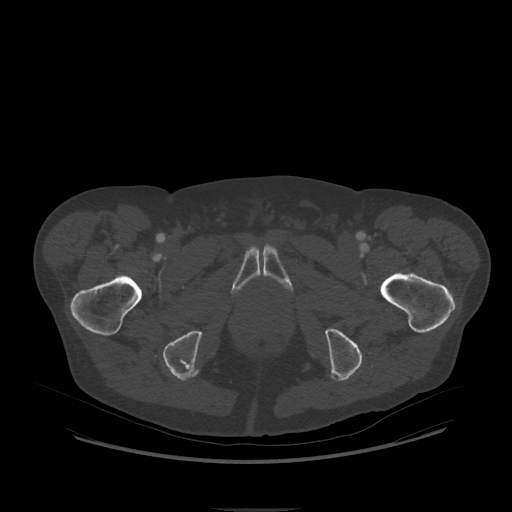
[im 56/195  soft-tissue]
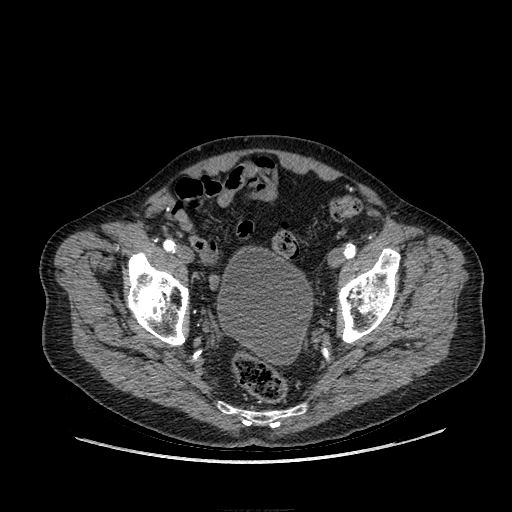
[im 84/195  soft-tissue]
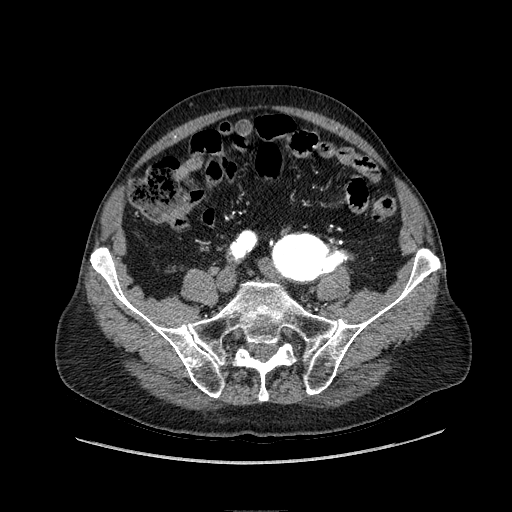
[im 84/195  lung]
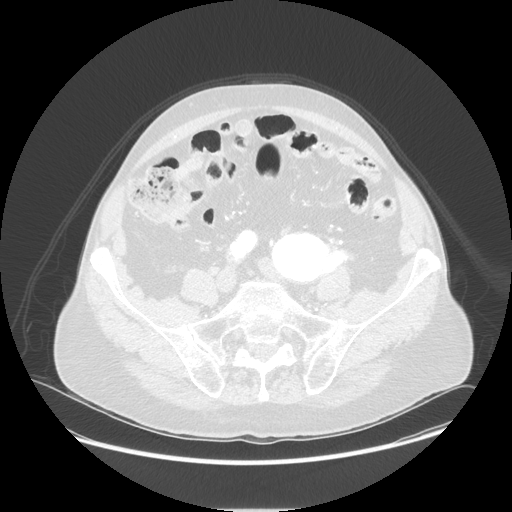
[im 111/195  soft-tissue]
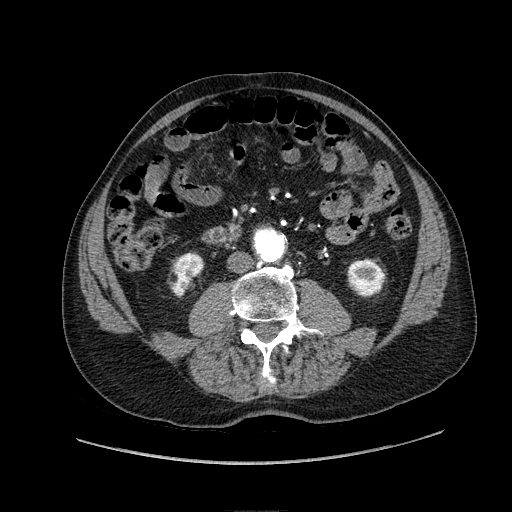
[im 111/195  lung]
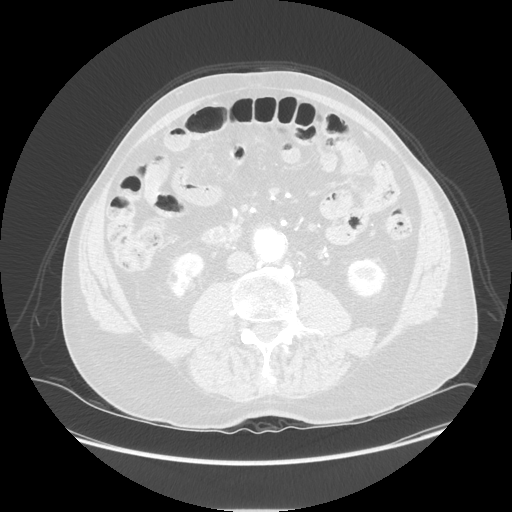
[im 139/195  soft-tissue]
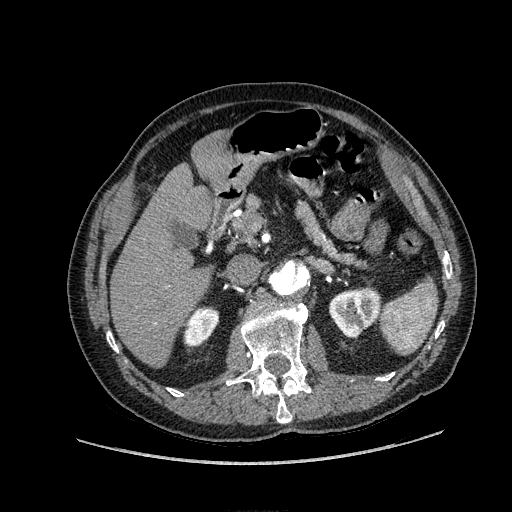
[im 139/195  lung]
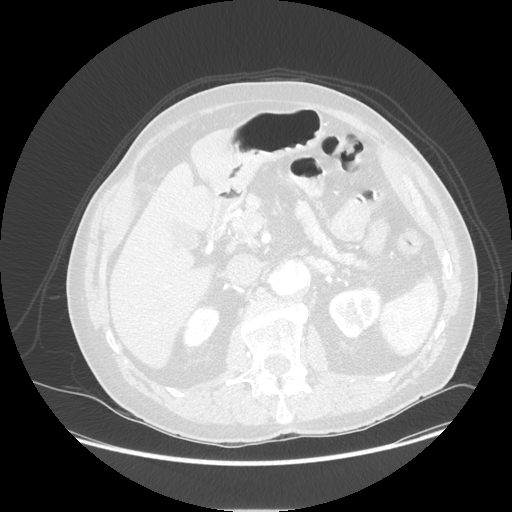
[im 167/195  soft-tissue]
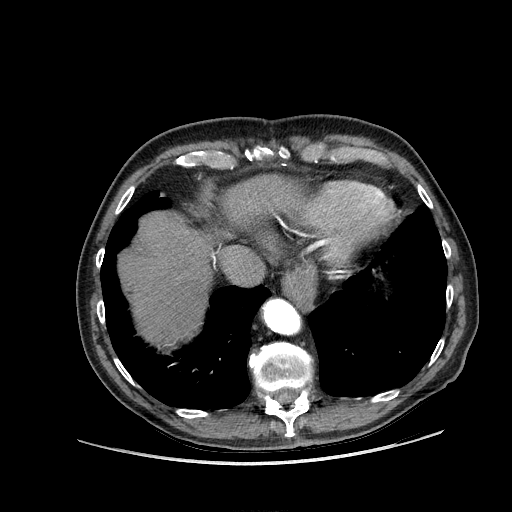
[im 167/195  lung]
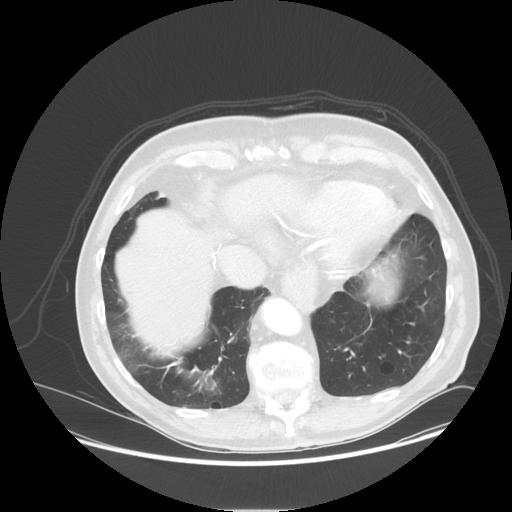

[Series 601: coronal body · coronal · 0.95mm/px · 1 of 130 slices shown, 2 images]
[im 44/130  soft-tissue]
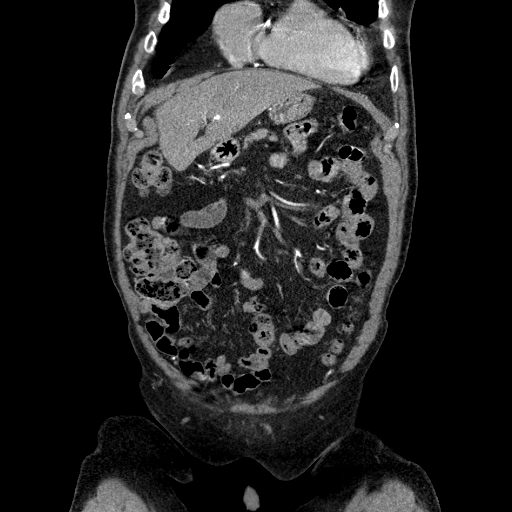
[im 44/130  bone]
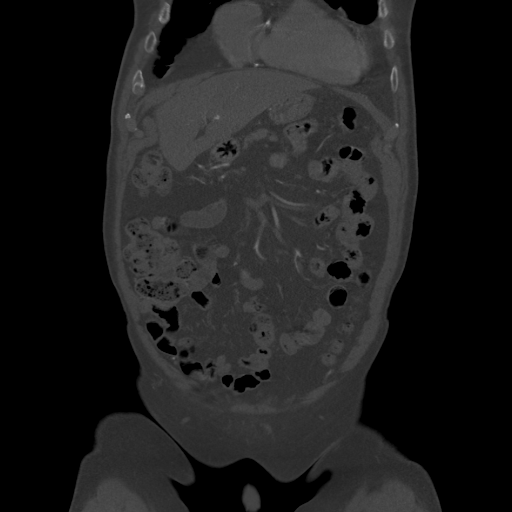

[Series 602: sagittal body · sagittal · 0.95mm/px · 5 of 154 slices shown]
[im 26/154  soft-tissue]
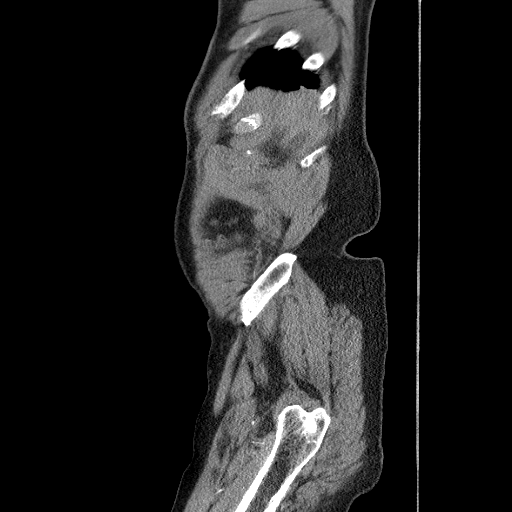
[im 52/154  soft-tissue]
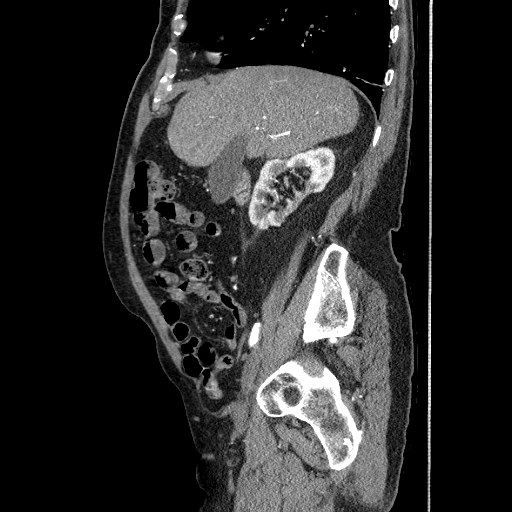
[im 77/154  soft-tissue]
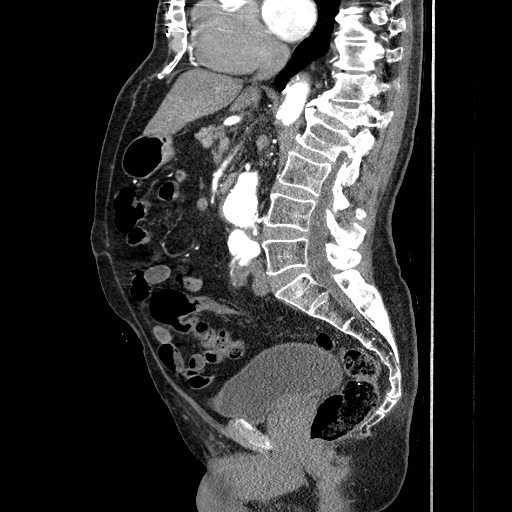
[im 103/154  soft-tissue]
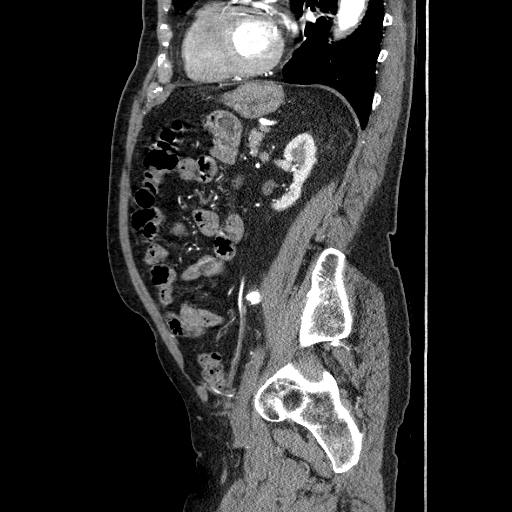
[im 128/154  soft-tissue]
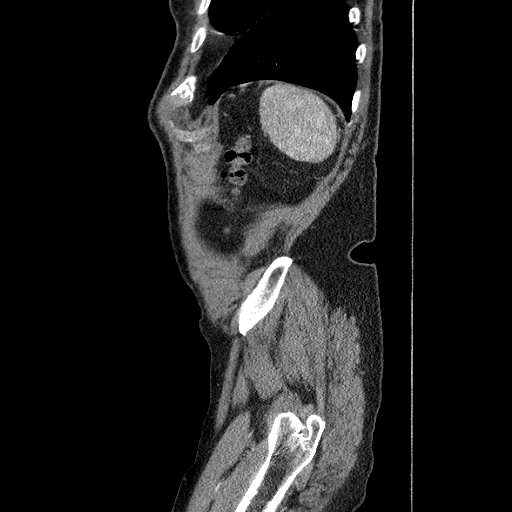

[12 of 36 positions shown; findings below may reference images not displayed]

FINDINGS: Visualized lung bases appear normal.  The liver, spleen
and pancreas appear normal.  No gallstones are noted.  Adrenal
glands appear normal.  Bilateral cortical scarring of both kidneys
is noted.  No hydronephrosis or renal obstruction is noted.
Aneurysmal dilatation of the proximal portion of the infrarenal
abdominal aorta is noted measuring 4.0 x 3.7 cm in size.  More
inferiorly, the aneurysm also measures 4.0 x 3.7 cm.  The celiac
artery is widely patent as well as the inferior mesenteric artery.
The superior mesenteric artery is patent although a severe
eccentric plaque is noted proximally.  Renal arteries are widely
patent, although severe stenoses are noted at their origins as well
due to aortic plaques.  Multiple large atherosclerotic ulcers are
noted in the proximal infrarenal abdominal aorta.

There is noted either a large chronic dissection or atherosclerotic
ulcer involving the left common iliac artery that measures 6.4 x
3.4 cm.  It results in compression of the true lumen of this
vessel. Similar abnormality is seen involving the proximal right
common iliac artery that measures 3.0 x 2.9 cm in size. The
internal and external iliac arteries as well as the common femoral
arteries are widely patent as well.

 Review of the MIP images confirms the above findings.
IMPRESSION: Infrarenal abdominal aortic aneurysm is noted with maximum measured
transverse size of 4.0 x 3.7 cm.  Multiple large atherosclerotic
ulcers are seen in this aneurysm.  The mesenteric and renal
arteries are patent, although significant stenoses are seen
involving the origins of both renal arteries and the superior
mesenteric artery.  Also noted is a large chronic dissection or
atherosclerotic ulcer involving the left common iliac artery which
measures 6.4 x 3.4 cm and results in significant compression of the
true lumen of this vessel.  Similar but smaller abnormality is seen
involving the proximal right common iliac artery which measures
x 2.9 cm.  This also results in significant narrowing of the true
lumen of the vessel.
